# Patient Record
Sex: Female | Born: 1975
Health system: Southern US, Community
[De-identification: ages and names within clinical notes are randomized; demographics above are authoritative.]

## PROBLEM LIST (undated history)

## (undated) DIAGNOSIS — K219 Gastro-esophageal reflux disease without esophagitis: Secondary | ICD-10-CM

## (undated) DIAGNOSIS — N951 Menopausal and female climacteric states: Secondary | ICD-10-CM

## (undated) DIAGNOSIS — E785 Hyperlipidemia, unspecified: Secondary | ICD-10-CM

## (undated) DIAGNOSIS — K921 Melena: Secondary | ICD-10-CM

## (undated) DIAGNOSIS — N898 Other specified noninflammatory disorders of vagina: Secondary | ICD-10-CM

## (undated) DIAGNOSIS — R319 Hematuria, unspecified: Secondary | ICD-10-CM

## (undated) DIAGNOSIS — D509 Iron deficiency anemia, unspecified: Secondary | ICD-10-CM

## (undated) HISTORY — DX: Gastro-esophageal reflux disease without esophagitis: K21.9

## (undated) HISTORY — DX: Other specified noninflammatory disorders of vagina: N89.8

## (undated) HISTORY — DX: Hyperlipidemia, unspecified: E78.5

## (undated) HISTORY — PX: NO PAST SURGERIES: SHX2092

## (undated) HISTORY — DX: Menopausal and female climacteric states: N95.1

## (undated) HISTORY — PX: COLONOSCOPY WITH ESOPHAGOGASTRODUODENOSCOPY (EGD): SHX5779

## (undated) HISTORY — DX: Hematuria, unspecified: R31.9

---

## 2005-04-07 ENCOUNTER — Inpatient Hospital Stay: Payer: Self-pay | Admitting: Obstetrics and Gynecology

## 2010-06-04 ENCOUNTER — Ambulatory Visit: Payer: Self-pay | Admitting: Obstetrics and Gynecology

## 2010-09-17 ENCOUNTER — Inpatient Hospital Stay: Payer: Self-pay | Admitting: Obstetrics and Gynecology

## 2010-09-19 LAB — PATHOLOGY REPORT

## 2012-04-28 ENCOUNTER — Ambulatory Visit: Payer: Self-pay | Admitting: Obstetrics and Gynecology

## 2014-04-13 LAB — HM PAP SMEAR: HM Pap smear: NEGATIVE

## 2016-04-23 ENCOUNTER — Encounter: Payer: Self-pay | Admitting: Obstetrics and Gynecology

## 2016-05-01 ENCOUNTER — Ambulatory Visit (INDEPENDENT_AMBULATORY_CARE_PROVIDER_SITE_OTHER): Payer: BLUE CROSS/BLUE SHIELD | Admitting: Obstetrics and Gynecology

## 2016-05-01 ENCOUNTER — Encounter: Payer: Self-pay | Admitting: Obstetrics and Gynecology

## 2016-05-01 VITALS — BP 90/62 | HR 73 | Ht 63.0 in | Wt 134.8 lb

## 2016-05-01 DIAGNOSIS — Z0000 Encounter for general adult medical examination without abnormal findings: Secondary | ICD-10-CM

## 2016-05-01 DIAGNOSIS — Z01419 Encounter for gynecological examination (general) (routine) without abnormal findings: Secondary | ICD-10-CM

## 2016-05-01 DIAGNOSIS — Z1231 Encounter for screening mammogram for malignant neoplasm of breast: Secondary | ICD-10-CM | POA: Diagnosis not present

## 2016-05-01 NOTE — Patient Instructions (Signed)
1. Pap smear not. 2. Mammogram ordered 3. Continue with healthy eating and exercise 4. Return in 1 year for physical 5. No laboratory work necessary today

## 2016-05-01 NOTE — Progress Notes (Signed)
Patient ID: Paige Robertson, female   DOB: [birth year], 40 y.o.   MRN: [id number] ANNUAL PREVENTATIVE CARE GYN  ENCOUNTER NOTE  Subjective:       Paige Robertson is a 40 y.o. G33P2002 female here for a routine annual gynecologic exam.  Current complaints: 1.  none    Cycles regular. Bowel and bladder function are normal. No need interval health issues.   Gynecologic History Patient's last menstrual period was 04/18/2016 (approximate). Contraception: vasectomy Last Pap: 2015 n/n. Results were: normal. Last mammogram: n/a. Results were: n/a  Obstetric History OB History  Gravida Para Term Preterm AB SAB TAB Ectopic Multiple Living  2 2 2       2     # Outcome Date GA Lbr Len/2nd Weight Sex Delivery Anes PTL Lv  2 Term 2011   5 lb 2.1 oz (2.327 kg) F Vag-Spont   Y  1 Term 2006   6 lb 2.2 oz (2.785 kg) F Vag-Spont   Y      Past Medical History  Diagnosis Date  . Perimenopausal symptoms   . Hematuria   . Vaginal discharge   . Hyperlipemia     Past Surgical History  Procedure Laterality Date  . No past surgeries      No current outpatient prescriptions on file prior to visit.   No current facility-administered medications on file prior to visit.    Allergies  Allergen Reactions  . Sulfa Antibiotics     Social History   Social History  . Marital Status: Married    Spouse Name: N/A  . Number of Children: N/A  . Years of Education: N/A   Occupational History  . Not on file.   Social History Main Topics  . Smoking status: Never Smoker   . Smokeless tobacco: Not on file  . Alcohol Use: No  . Drug Use: No  . Sexual Activity: Yes     Comment: vasectomy   Other Topics Concern  . Not on file   Social History Narrative    Family History  Problem Relation Age of Onset  . Diabetes Father   . Breast cancer Maternal Grandmother   . Colon cancer Neg Hx   . Ovarian cancer Neg Hx   . Heart disease Neg Hx     The following portions of the patient's history were  reviewed and updated as appropriate: allergies, current medications, past family history, past medical history, past social history, past surgical history and problem list.  Review of Systems ROS Review of Systems - General ROS: negative for - chills, fatigue, fever, hot flashes, night sweats, weight gain or weight loss Psychological ROS: negative for - anxiety, decreased libido, depression, mood swings, physical abuse or sexual abuse Ophthalmic ROS: negative for - blurry vision, eye pain or loss of vision ENT ROS: negative for - headaches, hearing change, visual changes or vocal changes Allergy and Immunology ROS: negative for - hives, itchy/watery eyes or seasonal allergies Hematological and Lymphatic ROS: negative for - bleeding problems, bruising, swollen lymph nodes or weight loss Endocrine ROS: negative for - galactorrhea, hair pattern changes, hot flashes, malaise/lethargy, mood swings, palpitations, polydipsia/polyuria, skin changes, temperature intolerance or unexpected weight changes Breast ROS: negative for - new or changing breast lumps or nipple discharge Respiratory ROS: negative for - cough or shortness of breath Cardiovascular ROS: negative for - chest pain, irregular heartbeat, palpitations or shortness of breath Gastrointestinal ROS: no abdominal pain, change in bowel habits, or black or bloody stools Genito-Urinary ROS:  no dysuria, trouble voiding, or hematuria Musculoskeletal ROS: negative for - joint pain or joint stiffness Neurological ROS: negative for - bowel and bladder control changes Dermatological ROS: negative for rash and skin lesion changes   Objective:   BP 90/62 mmHg  Pulse 73  Ht 5\' 3"  (1.6 m)  Wt 134 lb 12.8 oz (61.145 kg)  BMI 23.88 kg/m2  LMP 04/18/2016 (Approximate) CONSTITUTIONAL: Well-developed, well-nourished female in no acute distress.  PSYCHIATRIC: Normal mood and affect. Normal behavior. Normal judgment and thought content. [place]: Alert  and oriented to person, place, and time. Normal muscle tone coordination. No cranial nerve deficit noted. HENT:  Normocephalic, atraumatic, External right and left ear normal. Oropharynx is clear and moist EYES: Conjunctivae and EOM are normal. No scleral icterus.  NECK: Normal range of motion, supple, no masses.  Normal thyroid.  SKIN: Skin is warm and dry. No rash noted. Not diaphoretic. No erythema. No pallor. CARDIOVASCULAR: Normal heart rate noted, regular rhythm, no murmur. RESPIRATORY: Clear to auscultation bilaterally. Effort and breath sounds normal, no problems with respiration noted. BREASTS: Symmetric in size. No masses, skin changes, nipple drainage, or lymphadenopathy. ABDOMEN: Soft, normal bowel sounds, no distention noted.  No tenderness, rebound or guarding.  BLADDER: Normal PELVIC:  External Genitalia: Normal  BUS: Normal  Vagina: Normal  Cervix: Normal; eversion present  Uterus: Normal; midplane, normal size and shape, mobile  Adnexa: Normal  RV: External Exam NormaI  MUSCULOSKELETAL: Normal range of motion. No tenderness.  No cyanosis, clubbing, or edema.  2+ distal pulses. LYMPHATIC: No Axillary, Supraclavicular, or Inguinal Adenopathy.    Assessment:   Annual gynecologic examination 40 y.o. Contraception: vasectomy Normal BMI  Plan:  Pap: Not done Mammogram: Ordered Stool Guaiac Testing:  Not Indicated Labs: thru - employer Routine preventative health maintenance measures emphasized: Exercise/Diet/Weight control, Tobacco Warnings and Alcohol/Substance use risks Return to [place], CMA Brayton Mars, MD  Note: This dictation was prepared with Dragon dictation along with smaller phrase technology. Any transcriptional errors that result from this process are unintentional.

## 2016-10-27 ENCOUNTER — Ambulatory Visit
Admission: RE | Admit: 2016-10-27 | Discharge: 2016-10-27 | Disposition: A | Discharge status: Home / Self Care | Payer: BLUE CROSS/BLUE SHIELD | Source: Ambulatory Visit | Attending: Obstetrics and Gynecology | Admitting: Obstetrics and Gynecology

## 2016-10-27 DIAGNOSIS — Z1231 Encounter for screening mammogram for malignant neoplasm of breast: Secondary | ICD-10-CM | POA: Diagnosis not present

## 2017-04-28 ENCOUNTER — Encounter: Payer: Self-pay | Admitting: Obstetrics and Gynecology

## 2017-04-28 ENCOUNTER — Other Ambulatory Visit: Payer: Self-pay

## 2017-04-28 ENCOUNTER — Telehealth: Payer: Self-pay | Admitting: Obstetrics and Gynecology

## 2017-04-28 DIAGNOSIS — Z01419 Encounter for gynecological examination (general) (routine) without abnormal findings: Secondary | ICD-10-CM

## 2017-04-28 NOTE — Telephone Encounter (Signed)
Patient lvm to send in a request for blood work to be done at next appointment. Patient disclosed that blood work has to be requested in order to have it done. Please advise.

## 2017-04-28 NOTE — Telephone Encounter (Signed)
Pt aware labs placed

## 2017-04-29 ENCOUNTER — Other Ambulatory Visit: Payer: Self-pay

## 2017-04-29 DIAGNOSIS — Z01419 Encounter for gynecological examination (general) (routine) without abnormal findings: Secondary | ICD-10-CM

## 2017-04-30 LAB — GLUCOSE, RANDOM: Glucose: 82 mg/dL (ref 65–99)

## 2017-04-30 LAB — LIPID PANEL
Chol/HDL Ratio: 2.4 ratio (ref 0.0–4.4)
Cholesterol, Total: 191 mg/dL (ref 100–199)
HDL: 79 mg/dL (ref >39)
LDL Calculated: 96 mg/dL (ref 0–99)
Triglycerides: 82 mg/dL (ref 0–149)
VLDL Cholesterol Cal: 16 mg/dL (ref 5–40)

## 2017-04-30 LAB — HEMOGLOBIN A1C
Est. average glucose Bld gHb Est-mCnc: 94 mg/dL
Hgb A1c MFr Bld: 4.9 % (ref 4.8–5.6)

## 2017-04-30 LAB — TSH: TSH: 3.22 u[IU]/mL (ref 0.450–4.500)

## 2017-05-04 NOTE — Progress Notes (Signed)
Patient ID: Paige Robertson, female   DOB: [birth year], 41 y.o.   MRN: [id number] ANNUAL PREVENTATIVE CARE GYN  ENCOUNTER NOTE  Subjective:       Paige Robertson is a 41 y.o. G2P2002 female here for a routine annual gynecologic exam.  Current complaints: 1. Stress Incontinence  Cycles regular. Bowel  function is normal. No new interval health issues.  GU history: Urinary frequency-5-6 per day Nocturia-none Stress urinary incontinence-present Urge symptoms-mild   Gynecologic History No LMP recorded. Contraception: vasectomy Last Pap: 2015 n/n. Results were: normal. Last mammogram: 10/2016 [facility]. Results were: wnl  Obstetric History OB History  Gravida Para Term Preterm AB Living  2 2 2     2   SAB TAB Ectopic Multiple Live Births          2    # Outcome Date GA Lbr Len/2nd Weight Sex Delivery Anes PTL Lv  2 Term 2011   5 lb 2.1 oz (2.327 kg) F Vag-Spont   LIV  1 Term 2006   6 lb 2.2 oz (2.785 kg) F Vag-Spont   LIV      Past Medical History:  Diagnosis Date  . Hematuria   . Hyperlipemia   . Perimenopausal symptoms   . Vaginal discharge     Past Surgical History:  Procedure Laterality Date  . NO PAST SURGERIES      No current outpatient prescriptions on file prior to visit.   No current facility-administered medications on file prior to visit.     Allergies  Allergen Reactions  . Sulfa Antibiotics     Social History   Social History  . Marital status: Married    Spouse name: N/A  . Number of children: N/A  . Years of education: N/A   Occupational History  . Not on file.   Social History Main Topics  . Smoking status: Never Smoker  . Smokeless tobacco: Not on file  . Alcohol use No  . Drug use: No  . Sexual activity: Yes     Comment: vasectomy   Other Topics Concern  . Not on file   Social History Narrative  . No narrative on file    Family History  Problem Relation Age of Onset  . Diabetes Father   . Breast cancer Maternal Grandmother    . Breast cancer Paternal Aunt   . Colon cancer Neg Hx   . Ovarian cancer Neg Hx   . Heart disease Neg Hx     The following portions of the patient's history were reviewed and updated as appropriate: allergies, current medications, past family history, past medical history, past social history, past surgical history and problem list.  Review of Systems Review of Systems  Constitutional: Negative.   HENT: Negative.   Eyes: Negative.   Respiratory: Negative.   Cardiovascular: Negative.   Gastrointestinal: Negative.   Genitourinary:       Urge symptoms present Stress incontinence present No nocturia No recurrent UTIs  Musculoskeletal: Negative.   Skin: Negative.   Neurological: Negative.   Endo/Heme/Allergies: Negative.   Psychiatric/Behavioral: Negative.      Objective:   BP (!) 93/57   Pulse 89   Ht 5\' 3"  (1.6 m)   Wt 129 lb 12.8 oz (58.9 kg)   LMP 04/03/2017 (Exact Date)   BMI 22.99 kg/m  CONSTITUTIONAL: Well-developed, well-nourished female in no acute distress.  PSYCHIATRIC: Normal mood and affect. Normal behavior. Normal judgment and thought content. [place]: Alert and oriented to person, place, and time.  Normal muscle tone coordination. No cranial nerve deficit noted. HENT:  Normocephalic, atraumatic, External right and left ear normal. Oropharynx is clear and moist EYES: Conjunctivae and EOM are normal. No scleral icterus.  NECK: Normal range of motion, supple, no masses.  Normal thyroid.  SKIN: Skin is warm and dry. No rash noted. Not diaphoretic. No erythema. No pallor. CARDIOVASCULAR: Normal heart rate noted, regular rhythm, no murmur. RESPIRATORY: Clear to auscultation bilaterally. Effort and breath sounds normal, no problems with respiration noted. BREASTS: Symmetric in size. No masses, skin changes, nipple drainage, or lymphadenopathy. ABDOMEN: Soft, normal bowel sounds, no distention noted.  No tenderness, rebound or guarding.  BLADDER:  Normal PELVIC:  External Genitalia: Normal  BUS: Normal  Vagina: Normal  Cervix: Normal; eversion present  Uterus: Normal; midplane, normal size and shape, mobile  Adnexa: Normal  RV: External Exam NormaI  MUSCULOSKELETAL: Normal range of motion. No tenderness.  No cyanosis, clubbing, or edema.  2+ distal pulses. LYMPHATIC: No Axillary, Supraclavicular, or Inguinal Adenopathy.  Review of lipid panel: Total cholesterol-194 LDL-96 HDL-79 HDL/cholesterol ratio-2.4  Assessment:   Annual gynecologic examination 41 y.o. Contraception: vasectomy Normal BMI  SUI Mild urge symptoms Family history of hyperlipidemia; normal lipid 1 panel  Plan:  Pap: pap/hpv Mammogram: Ordered Stool Guaiac Testing:  Not Indicated Labs: thru - employer Routine preventative health maintenance measures emphasized: Exercise/Diet/Weight control, Tobacco Warnings and Alcohol/Substance use risks  Kegel exercises encouraged Physical therapy consult is recommended but declined at this time; patient may call back if Kegel exercises on are not effective Return to [place], CMA  Brayton Mars, MD   Note: This dictation was prepared with Dragon dictation along with smaller phrase technology. Any transcriptional errors that result from this process are unintentional.

## 2017-05-06 ENCOUNTER — Encounter: Payer: Self-pay | Admitting: Obstetrics and Gynecology

## 2017-05-06 ENCOUNTER — Ambulatory Visit (INDEPENDENT_AMBULATORY_CARE_PROVIDER_SITE_OTHER): Payer: BLUE CROSS/BLUE SHIELD | Admitting: Obstetrics and Gynecology

## 2017-05-06 VITALS — BP 93/57 | HR 89 | Ht 63.0 in | Wt 129.8 lb

## 2017-05-06 DIAGNOSIS — Z01419 Encounter for gynecological examination (general) (routine) without abnormal findings: Secondary | ICD-10-CM

## 2017-05-06 DIAGNOSIS — Z1231 Encounter for screening mammogram for malignant neoplasm of breast: Secondary | ICD-10-CM

## 2017-05-06 DIAGNOSIS — N393 Stress incontinence (female) (male): Secondary | ICD-10-CM

## 2017-05-06 NOTE — Patient Instructions (Addendum)
1. Pap smear is completed 2. Mammogram is ordered 3. Screening labs have been done already and reviewed 4. Continue with healthy eating and exercise 5. Return in 1 year for annual exam 6. Kegel exercises are encouraged 7. Physical therapy consult for stress incontinence was offered but declined at this time   Kegel Exercises Kegel exercises help strengthen the muscles that support the rectum, vagina, small intestine, bladder, and uterus. Doing Kegel exercises can help:  Improve bladder and bowel control.  Improve sexual response.  Reduce problems and discomfort during pregnancy.  Kegel exercises involve squeezing your pelvic floor muscles, which are the same muscles you squeeze when you try to stop the flow of urine. The exercises can be done while sitting, standing, or lying down, but it is best to vary your position. Phase 1 exercises 1. Squeeze your pelvic floor muscles tight. You should feel a tight lift in your rectal area. If you are a female, you should also feel a tightness in your vaginal area. Keep your stomach, buttocks, and legs relaxed. 2. Hold the muscles tight for up to 10 seconds. 3. Relax your muscles. Repeat this exercise 50 times a day or as many times as told by your health care provider. Continue to do this exercise for at least 4-6 weeks or for as long as told by your health care provider. This information is not intended to replace advice given to you by your health care provider. Make sure you discuss any questions you have with your health care provider. Document Released: 11/03/2012 Document Revised: 07/12/2016 Document Reviewed: 10/07/2015 Elsevier Interactive Patient Education  2018 [place] Maintenance, Female Adopting a healthy lifestyle and getting preventive care can go a long way to promote health and wellness. Talk with your health care provider about what schedule of regular examinations is right for you. This is a good chance for you to  check in with your provider about disease prevention and staying healthy. In between checkups, there are plenty of things you can do on your own. Experts have done a lot of research about which lifestyle changes and preventive measures are most likely to keep you healthy. Ask your health care provider for more information. Weight and diet Eat a healthy diet  Be sure to include plenty of vegetables, fruits, low-fat dairy products, and lean protein.  Do not eat a lot of foods high in solid fats, added sugars, or salt.  Get regular exercise. This is one of the most important things you can do for your health. ? Most adults should exercise for at least 150 minutes each week. The exercise should increase your heart rate and make you sweat (moderate-intensity exercise). ? Most adults should also do strengthening exercises at least twice a week. This is in addition to the moderate-intensity exercise.  Maintain a healthy weight  Body mass index (BMI) is a measurement that can be used to identify possible weight problems. It estimates body fat based on height and weight. Your health care provider can help determine your BMI and help you achieve or maintain a healthy weight.  For females 72 years of age and older: ? A BMI below 18.5 is considered underweight. ? A BMI of 18.5 to 24.9 is normal. ? A BMI of 25 to 29.9 is considered overweight. ? A BMI of 30 and above is considered obese.  Watch levels of cholesterol and blood lipids  You should start having your blood tested for lipids and cholesterol at 41  years of age, then have this test every 5 years.  You may need to have your cholesterol levels checked more often if: ? Your lipid or cholesterol levels are high. ? You are older than 41 years of age. ? You are at high risk for heart disease.  Cancer screening Lung Cancer  Lung cancer screening is recommended for adults 32-4 years old who are at high risk for lung cancer because of a  history of smoking.  A yearly low-dose CT scan of the lungs is recommended for people who: ? Currently smoke. ? Have quit within the past 15 years. ? Have at least a 30-pack-year history of smoking. A pack year is smoking an average of one pack of cigarettes a day for 1 year.  Yearly screening should continue until it has been 15 years since you quit.  Yearly screening should stop if you develop a health problem that would prevent you from having lung cancer treatment.  Breast Cancer  Practice breast self-awareness. This means understanding how your breasts normally appear and feel.  It also means doing regular breast self-exams. Let your health care provider know about any changes, no matter how small.  If you are in your 20s or 30s, you should have a clinical breast exam (CBE) by a health care provider every 1-3 years as part of a regular health exam.  If you are 59 or older, have a CBE every year. Also consider having a breast X-ray (mammogram) every year.  If you have a family history of breast cancer, talk to your health care provider about genetic screening.  If you are at high risk for breast cancer, talk to your health care provider about having an MRI and a mammogram every year.  Breast cancer gene (BRCA) assessment is recommended for women who have family members with BRCA-related cancers. BRCA-related cancers include: ? Breast. ? Ovarian. ? Tubal. ? Peritoneal cancers.  Results of the assessment will determine the need for genetic counseling and BRCA1 and BRCA2 testing.  Cervical Cancer Your health care provider may recommend that you be screened regularly for cancer of the pelvic organs (ovaries, uterus, and vagina). This screening involves a pelvic examination, including checking for microscopic changes to the surface of your cervix (Pap test). You may be encouraged to have this screening done every 3 years, beginning at age 9.  For women ages 74-65, health care  providers may recommend pelvic exams and Pap testing every 3 years, or they may recommend the Pap and pelvic exam, combined with testing for human papilloma virus (HPV), every 5 years. Some types of HPV increase your risk of cervical cancer. Testing for HPV may also be done on women of any age with unclear Pap test results.  Other health care providers may not recommend any screening for nonpregnant women who are considered low risk for pelvic cancer and who do not have symptoms. Ask your health care provider if a screening pelvic exam is right for you.  If you have had past treatment for cervical cancer or a condition that could lead to cancer, you need Pap tests and screening for cancer for at least 20 years after your treatment. If Pap tests have been discontinued, your risk factors (such as having a new sexual partner) need to be reassessed to determine if screening should resume. Some women have medical problems that increase the chance of getting cervical cancer. In these cases, your health care provider may recommend more frequent screening and Pap tests.  Colorectal Cancer  This type of cancer can be detected and often prevented.  Routine colorectal cancer screening usually begins at 41 years of age and continues through 41 years of age.  Your health care provider may recommend screening at an earlier age if you have risk factors for colon cancer.  Your health care provider may also recommend using home test kits to check for hidden blood in the stool.  A small camera at the end of a tube can be used to examine your colon directly (sigmoidoscopy or colonoscopy). This is done to check for the earliest forms of colorectal cancer.  Routine screening usually begins at age 21.  Direct examination of the colon should be repeated every 5-10 years through 41 years of age. However, you may need to be screened more often if early forms of precancerous polyps or small growths are found.  Skin  Cancer  Check your skin from head to toe regularly.  Tell your health care provider about any new moles or changes in moles, especially if there is a change in a mole's shape or color.  Also tell your health care provider if you have a mole that is larger than the size of a pencil eraser.  Always use sunscreen. Apply sunscreen liberally and repeatedly throughout the day.  Protect yourself by wearing long sleeves, pants, a wide-brimmed hat, and sunglasses whenever you are outside.  Heart disease, diabetes, and high blood pressure  High blood pressure causes heart disease and increases the risk of stroke. High blood pressure is more likely to develop in: ? People who have blood pressure in the high end of the normal range (130-139/85-89 mm Hg). ? People who are overweight or obese. ? People who are African American.  If you are 9-57 years of age, have your blood pressure checked every 3-5 years. If you are 45 years of age or older, have your blood pressure checked every year. You should have your blood pressure measured twice-once when you are at a hospital or clinic, and once when you are not at a hospital or clinic. Record the average of the two measurements. To check your blood pressure when you are not at a hospital or clinic, you can use: ? An automated blood pressure machine at a pharmacy. ? A home blood pressure monitor.  If you are between 75 years and 49 years old, ask your health care provider if you should take aspirin to prevent strokes.  Have regular diabetes screenings. This involves taking a blood sample to check your fasting blood sugar level. ? If you are at a normal weight and have a low risk for diabetes, have this test once every three years after 41 years of age. ? If you are overweight and have a high risk for diabetes, consider being tested at a younger age or more often. Preventing infection Hepatitis B  If you have a higher risk for hepatitis B, you should be  screened for this virus. You are considered at high risk for hepatitis B if: ? You were born in a country where hepatitis B is common. Ask your health care provider which countries are considered high risk. ? Your parents were born in a high-risk country, and you have not been immunized against hepatitis B (hepatitis B vaccine). ? You have HIV or AIDS. ? You use needles to inject street drugs. ? You live with someone who has hepatitis B. ? You have had sex with someone who has hepatitis B. ?  You get hemodialysis treatment. ? You take certain medicines for conditions, including cancer, organ transplantation, and autoimmune conditions.  Hepatitis C  Blood testing is recommended for: ? Everyone born from [birth year] through 1965. ? Anyone with known risk factors for hepatitis C.  Sexually transmitted infections (STIs)  You should be screened for sexually transmitted infections (STIs) including gonorrhea and chlamydia if: ? You are sexually active and are younger than 41 years of age. ? You are older than 41 years of age and your health care provider tells you that you are at risk for this type of infection. ? Your sexual activity has changed since you were last screened and you are at an increased risk for chlamydia or gonorrhea. Ask your health care provider if you are at risk.  If you do not have HIV, but are at risk, it may be recommended that you take a prescription medicine daily to prevent HIV infection. This is called pre-exposure prophylaxis (PrEP). You are considered at risk if: ? You are sexually active and do not regularly use condoms or know the HIV status of your partner(s). ? You take drugs by injection. ? You are sexually active with a partner who has HIV.  Talk with your health care provider about whether you are at high risk of being infected with HIV. If you choose to begin PrEP, you should first be tested for HIV. You should then be tested every 3 months for as long as you are  taking PrEP. Pregnancy  If you are premenopausal and you may become pregnant, ask your health care provider about preconception counseling.  If you may become pregnant, take 400 to 800 micrograms (mcg) of folic acid every day.  If you want to prevent pregnancy, talk to your health care provider about birth control (contraception). Osteoporosis and menopause  Osteoporosis is a disease in which the bones lose minerals and strength with aging. This can result in serious bone fractures. Your risk for osteoporosis can be identified using a bone density scan.  If you are 23 years of age or older, or if you are at risk for osteoporosis and fractures, ask your health care provider if you should be screened.  Ask your health care provider whether you should take a calcium or vitamin D supplement to lower your risk for osteoporosis.  Menopause may have certain physical symptoms and risks.  Hormone replacement therapy may reduce some of these symptoms and risks. Talk to your health care provider about whether hormone replacement therapy is right for you. Follow these instructions at home:  Schedule regular health, dental, and eye exams.  Stay current with your immunizations.  Do not use any tobacco products including cigarettes, chewing tobacco, or electronic cigarettes.  If you are pregnant, do not drink alcohol.  If you are breastfeeding, limit how much and how often you drink alcohol.  Limit alcohol intake to no more than 1 drink per day for nonpregnant women. One drink equals 12 ounces of beer, 5 ounces of wine, or 1 ounces of hard liquor.  Do not use street drugs.  Do not share needles.  Ask your health care provider for help if you need support or information about quitting drugs.  Tell your health care provider if you often feel depressed.  Tell your health care provider if you have ever been abused or do not feel safe at home. This information is not intended to replace  advice given to you by your health care provider. Make sure  you discuss any questions you have with your health care provider. Document Released: 06/02/2011 Document Revised: 04/24/2016 Document Reviewed: 08/21/2015 Elsevier Interactive Patient Education  [employer].

## 2017-05-08 LAB — PAP IG AND HPV HIGH-RISK
HPV, high-risk: NEGATIVE
PAP Smear Comment: 0

## 2017-07-16 ENCOUNTER — Telehealth: Payer: Self-pay | Admitting: Obstetrics and Gynecology

## 2017-07-16 MED ORDER — FLUCONAZOLE 150 MG PO TABS: 150.0000 mg | ORAL_TABLET | Freq: Every day | ORAL | 0 refills | Status: DC

## 2017-07-16 NOTE — Telephone Encounter (Signed)
Patient called stating that she has/may have a yeast infection. The patient is wanting Dr.De to prescribe medication to help with the "issue" she is having. The patient did not disclose any other information. Please advise.

## 2017-07-16 NOTE — Telephone Encounter (Signed)
Patient called back and wants to speak with nurse - confirmed message to CM - patient will receive call back as soon as CM can

## 2017-07-16 NOTE — Telephone Encounter (Signed)
Pt states she is having vaginal itching and burning. NO d/c. NO change in body wash, detergent, or fabric softener. Diflucan erx. Pt aware if no better in 7 days she will need to be seen.

## 2017-09-03 ENCOUNTER — Ambulatory Visit: Payer: Self-pay | Admitting: Adult Health

## 2017-09-03 ENCOUNTER — Encounter: Payer: Self-pay | Admitting: Adult Health

## 2017-09-03 VITALS — BP 96/62 | HR 78 | Temp 97.8°F | Resp 16 | Wt 130.0 lb

## 2017-09-03 DIAGNOSIS — H6092 Unspecified otitis externa, left ear: Secondary | ICD-10-CM

## 2017-09-03 DIAGNOSIS — H6690 Otitis media, unspecified, unspecified ear: Secondary | ICD-10-CM

## 2017-09-03 MED ORDER — AMOXICILLIN-POT CLAVULANATE 875-125 MG PO TABS: 1.0000 | ORAL_TABLET | Freq: Two times a day (BID) | ORAL | 0 refills | Status: DC

## 2017-09-03 MED ORDER — NEOMYCIN-POLYMYXIN-HC 3.5-10000-1 OT SOLN: 3.0000 [drp] | Freq: Four times a day (QID) | OTIC | 0 refills | Status: AC

## 2017-09-03 NOTE — Progress Notes (Addendum)
Subjective:     Patient ID: Paige Robertson, female   DOB: 03-19-1976, 41 y.o.   MRN: [id number]  HPI  Patient is a 41 year old female in no acute distress who presents with left ear pain 5/10 currently. She reports ear pain started this past Sunday.  She reports she has had cold like symptoms for the past week, she has post  nasal drip. Clear nasal discharge. She denies any recent swimming but does report she gets her ears wet in shower,  She denies any fevers, nausea, vomiting, or diarrhea.  No ill exposures.  Blood pressure 96/62, pulse 78, temperature 97.8 F (36.6 C), resp. rate 16, weight 130 lb (59 kg), SpO2 98 %. Patient's last menstrual period was 08/18/2017 (exact date).   Allergies  Allergen Reactions  . Sulfa Antibiotics   She reports taking the below medications.  Zyrtec daily  Flonase daily.   Patient Active Problem List   Diagnosis Date Noted  . SUI (stress urinary incontinence, female) 05/06/2017    Current Outpatient Prescriptions:     Review of Systems  Constitutional: Negative.   HENT: Positive for ear pain and postnasal drip. Negative for congestion, dental problem, drooling, ear discharge, facial swelling, hearing loss, mouth sores, nosebleeds, rhinorrhea, sinus pain, sinus pressure, sneezing, sore throat, tinnitus, trouble swallowing and voice change.   Eyes: Negative.   Respiratory: Negative.   Cardiovascular: Negative.   Gastrointestinal: Negative.   Genitourinary: Negative.   Musculoskeletal: Negative.   Skin: Negative.   Neurological: Negative.   Hematological: Negative.   Psychiatric/Behavioral: Negative.        Objective:   Physical Exam  Constitutional: She is oriented to person, place, and time. She appears well-developed and well-nourished. No distress.  HENT:  Head: Normocephalic and atraumatic.  Right Ear: Hearing, tympanic membrane, external ear and ear canal normal.  Left Ear: Hearing normal. No lacerations. There is swelling (mild  in canal able to clearly visualize tmpanic membrane ) and tenderness (mild with tragal pull ). No drainage. No foreign bodies. No mastoid tenderness. Tympanic membrane is erythematous. Tympanic membrane is not injected, not perforated, not retracted and not bulging. A middle ear effusion is present. No hemotympanum. No decreased hearing is noted.  Eyes: Conjunctivae and EOM are normal. Right eye exhibits no discharge. Left eye exhibits no discharge. No scleral icterus.  Neck: Normal range of motion. Neck supple. No JVD present. No tracheal deviation present. No thyromegaly present.  Cardiovascular: Normal rate, regular rhythm, normal heart sounds and intact distal pulses.  Exam reveals no gallop and no friction rub.   No murmur heard. Pulmonary/Chest: Effort normal and breath sounds normal. No stridor. No respiratory distress. She has no wheezes. She has no rales. She exhibits no tenderness.  Abdominal: Soft. Bowel sounds are normal.  Musculoskeletal: Normal range of motion. She exhibits no edema or deformity.  Lymphadenopathy:       Head (right side): No submental, no submandibular, no tonsillar, no preauricular, no posterior auricular and no occipital adenopathy present.       Head (left side): Posterior auricular (mild / single tiny node present ) adenopathy present. No submental, no submandibular, no tonsillar, no preauricular and no occipital adenopathy present.    She has no cervical adenopathy.  Neurological: She is alert and oriented to person, place, and time.  Patient moves on and off of exam table and in room without difficulty. Gait is normal in hall and in room. Patient is oriented to person place time and situation.  Patient answers questions appropriately and engages in conversation.   Skin: Skin is warm and dry. No rash noted. She is not diaphoretic. No erythema. No pallor.  Psychiatric: She has a normal mood and affect. Her speech is normal and behavior is normal. Judgment and thought  content normal. Cognition and memory are normal.     Assessment:     Otitis externa of left ear, unspecified chronicity, unspecified type  Acute otitis media, unspecified otitis media type    Plan:    Ordered only as below- she had Diflucan a month ago but reports she has completed dose.  Meds ordered this encounter  Medications  . DISCONTD: fluconazole (DIFLUCAN) 150 MG tablet    Refill:  0  . amoxicillin-clavulanate (AUGMENTIN) 875-125 MG tablet    Sig: Take 1 tablet by mouth 2 (two) times daily.    Dispense:  20 tablet    Refill:  0  . neomycin-polymyxin-hydrocortisone (CORTISPORIN) OTIC solution    Sig: Place 3 drops into the left ear 4 (four) times daily.    Dispense:  10 mL    Refill:  0   May stop Zyrtec and switch to Allegra daily per package instructions to see if better response since you have been on Zyrtec for years.    Return to clinic at any time  if any new symptoms change, worsen or do not improve. Symptoms should improve  within 72 hours and if not improving you should call for an appointment at the clinic or be seen in urgent care/ED if clinic is closed. Your symptoms should not get worse from this point forward and if they do seek immediate medical attention. AVS handout also given to patient.   Patient verbalized understanding of instructions and denies any further questions at this time.

## 2017-09-03 NOTE — Patient Instructions (Signed)
Fexofenadine capsules or tablets What is this medicine? FEXOFENADINE (fex oh FEN a deen) is an antihistamine. This medicine is used to treat or prevent symptoms of allergies. It is also used to help reduce itchy skin rash and hives. This medicine may be used for other purposes; ask your health care provider or pharmacist if you have questions. COMMON BRAND NAME(S): Allegra, Allegra Allergy 12 Hour, Allegra Allergy 24 Hour, Allegra Children's Allergy What should I tell my health care provider before I take this medicine? They need to know if you have any of these conditions: -kidney disease -an unusual or allergic reaction to fexofenadine, terfenadine, other medicines, foods, dyes, or preservatives -pregnant or trying to get pregnant -breast-feeding How should I use this medicine? Take this medicine by mouth with a full glass of water. Follow the directions on the prescription label. You may take this medicine with food or on an empty stomach. Take your medicine at regular intervals. Do not take it more often than directed. You may need to take this medicine for several days before your symptoms improve. Talk to your pediatrician regarding the use of this medicine in children. While this drug may be prescribed for children as young as 70 years old for selected conditions, precautions do apply. Overdosage: If you think you have taken too much of this medicine contact a poison control center or emergency room at once. NOTE: This medicine is only for you. Do not share this medicine with others. What if I miss a dose? If you miss a dose, take it as soon as you can. If it is almost time for your next dose, take only that dose. Do not take double or extra doses. What may interact with this medicine? -antacids -erythromycin -grapefruit, apple, or orange juice -ketoconazole -magnesium-containing products This list may not describe all possible interactions. Give your health care provider a list of all  the medicines, herbs, non-prescription drugs, or dietary supplements you use. Also tell them if you smoke, drink alcohol, or use illegal drugs. Some items may interact with your medicine. What should I watch for while using this medicine? Visit your doctor or health care professional for regular checks on your health. Tell your doctor or healthcare professional if your symptoms do not start to get better or if they get worse. What side effects may I notice from receiving this medicine? Side effects that you should report to your doctor or health care professional as soon as possible: -allergic reactions like skin rash, itching or hives, swelling of the face, lips, or tongue -breathing problems -chest pain -fast heartbeat -infection or fever Side effects that usually do not require medical attention (report to your doctor or health care professional if they continue or are bothersome): -cough -drowsiness -dry or irritated nose, mouth, or throat -headache -menstrual changes -pain -stomach upset, nausea This list may not describe all possible side effects. Call your doctor for medical advice about side effects. You may report side effects to FDA at 1-800-FDA-1088. Where should I keep my medicine? Keep out of the reach of children. Store at room temperature between 20 and 25 degrees C (68 and 77degrees F). Protect from moisture. Throw away any unused medicine after the expiration date. NOTE: This sheet is a summary. It may not cover all possible information. If you have questions about this medicine, talk to your doctor, pharmacist, or health care provider.  2018 Elsevier/Gold Standard (2006-08-19 12:30:00) Otitis Externa Otitis externa is an infection of the outer ear canal. The  outer ear canal is the area between the outside of the ear and the eardrum. Otitis externa is sometimes called "swimmer's ear." What are the causes? This condition may be caused by:  Swimming in dirty  water.  Moisture in the ear.  An injury to the inside of the ear.  An object stuck in the ear.  A cut or scrape on the outside of the ear.  What increases the risk? This condition is more likely to develop in swimmers. What are the signs or symptoms? The first symptom of this condition is often itching in the ear. Later signs and symptoms include:  Swelling of the ear.  Redness in the ear.  Ear pain. The pain may get worse when you pull on your ear.  Pus coming from the ear.  How is this diagnosed? This condition may be diagnosed by examining the ear and testing fluid from the ear for bacteria and funguses. How is this treated? This condition may be treated with:  Antibiotic ear drops. These are often given for 10-14 days.  Medicine to reduce itching and swelling.  Follow these instructions at home:  If you were prescribed antibiotic ear drops, apply them as told by your health care provider. Do not stop using the antibiotic even if your condition improves.  Take over-the-counter and prescription medicines only as told by your health care provider.  Keep all follow-up visits as told by your health care provider. This is important. How is this prevented?  Keep your ear dry. Use the corner of a towel to dry your ear after you swim or bathe.  Avoid scratching or putting things in your ear. Doing these things can damage the ear canal or remove the protective wax that lines it, which makes it easier for bacteria and funguses to grow.  Avoid swimming in lakes, polluted water, or pools that may not have the right amount of chlorine.  Consider making ear drops and putting 3 or 4 drops in each ear after you swim. Ask your health care provider about how you can make ear drops. Contact a health care provider if:  You have a fever.  After 3 days your ear is still red, swollen, painful, or draining pus.  Your redness, swelling, or pain gets worse.  You have a severe  headache.  You have redness, swelling, pain, or tenderness in the area behind your ear. This information is not intended to replace advice given to you by your health care provider. Make sure you discuss any questions you have with your health care provider. Document Released: 11/17/2005 Document Revised: 12/25/2015 Document Reviewed: 08/27/2015 Elsevier Interactive Patient Education  2018 [employer]. Otitis Media, Adult Otitis media is redness, soreness, and puffiness (swelling) in the space just behind your eardrum (middle ear). It may be caused by allergies or infection. It often happens along with a cold. Follow these instructions at home:  Take your medicine as told. Finish it even if you start to feel better.  Only take over-the-counter or prescription medicines for pain, discomfort, or fever as told by your doctor.  Follow up with your doctor as told. Contact a doctor if:  You have otitis media only in one ear, or bleeding from your nose, or both.  You notice a lump on your neck.  You are not getting better in 3-5 days.  You feel worse instead of better. Get help right away if:  You have pain that is not helped with medicine.  You have  puffiness, redness, or pain around your ear.  You get a stiff neck.  You cannot move part of your face (paralysis).  You notice that the bone behind your ear hurts when you touch it. This information is not intended to replace advice given to you by your health care provider. Make sure you discuss any questions you have with your health care provider. Document Released: 05/05/2008 Document Revised: 04/24/2016 Document Reviewed: 06/14/2013 Elsevier Interactive Patient Education  2017 [employer].

## 2017-10-01 ENCOUNTER — Ambulatory Visit: Payer: Self-pay | Admitting: Adult Health

## 2017-10-01 VITALS — BP 108/64 | HR 96 | Temp 99.4°F | Resp 16 | Wt 130.0 lb

## 2017-10-01 DIAGNOSIS — J018 Other acute sinusitis: Secondary | ICD-10-CM

## 2017-10-01 LAB — POCT URINE PREGNANCY: Preg Test, Ur: NEGATIVE

## 2017-10-01 MED ORDER — DOXYCYCLINE HYCLATE 100 MG PO TABS: 100.0000 mg | ORAL_TABLET | Freq: Two times a day (BID) | ORAL | 0 refills | Status: DC

## 2017-10-01 NOTE — Patient Instructions (Signed)
Guaifenesin oral ER tablets What is this medicine? GUAIFENESIN (gwye FEN e sin) is an expectorant. It helps to thin mucous and make coughs more productive. This medicine is used to treat coughs caused by colds or the flu. It is not intended to treat chronic cough caused by smoking, asthma, emphysema, or heart failure. This medicine may be used for other purposes; ask your health care provider or pharmacist if you have questions. COMMON BRAND NAME(S): Humibid, Mucinex What should I tell my health care provider before I take this medicine? They need to know if you have any of these conditions: -fever -kidney disease -an unusual or allergic reaction to guaifenesin, other medicines, foods, dyes, or preservatives -pregnant or trying to get pregnant -breast-feeding How should I use this medicine? Take this medicine by mouth with a full glass of water. Follow the directions on the prescription label. Do not break, chew or crush this medicine. You may take with food or on an empty stomach. Take your medicine at regular intervals. Do not take your medicine more often than directed. Talk to your pediatrician regarding the use of this medicine in children. While this drug may be prescribed for children as young as 55 years old for selected conditions, precautions do apply. Overdosage: If you think you have taken too much of this medicine contact a poison control center or emergency room at once. NOTE: This medicine is only for you. Do not share this medicine with others. What if I miss a dose? If you miss a dose, take it as soon as you can. If it is almost time for your next dose, take only that dose. Do not take double or extra doses. What may interact with this medicine? Interactions are not expected. This list may not describe all possible interactions. Give your health care provider a list of all the medicines, herbs, non-prescription drugs, or dietary supplements you use. Also tell them if you smoke,  drink alcohol, or use illegal drugs. Some items may interact with your medicine. What should I watch for while using this medicine? Do not treat a cough for more than 1 week without consulting your doctor or health care professional. If you also have a high fever, skin rash, continuing headache, or sore throat, see your doctor. For best results, drink 6 to 8 glasses water daily while you are taking this medicine. What side effects may I notice from receiving this medicine? Side effects that you should report to your doctor or health care professional as soon as possible: -allergic reactions like skin rash, itching or hives, swelling of the face, lips, or tongue Side effects that usually do not require medical attention (report to your doctor or health care professional if they continue or are bothersome): -dizziness -headache -stomach upset This list may not describe all possible side effects. Call your doctor for medical advice about side effects. You may report side effects to FDA at 1-800-FDA-1088. Where should I keep my medicine? Keep out of the reach of children. Store at room temperature between 20 and 25 degrees C (68 and 77 degrees F). Keep container tightly closed. Throw away any unused medicine after the expiration date. NOTE: This sheet is a summary. It may not cover all possible information. If you have questions about this medicine, talk to your doctor, pharmacist, or health care provider.  2018 Elsevier/Gold Standard (2008-03-29 12:14:14) Pseudoephedrine extended-release tablets What is this medicine? PSEUDOEPHEDRINE (soo doe e FED rin) is a decongestant. It is used to treat congestion  of the nose or sinuses. This medicine may be used for other purposes; ask your health care provider or pharmacist if you have questions. COMMON BRAND NAME(S): Dimetapp Decongestant, Drixoral, Sudafed 12 Hour, Sudafed 24 Hour, Sudogest 12 Hour What should I tell my health care provider before I take  this medicine? They need to know if you have any of the following conditions: -bowel problems -diabetes -glaucoma -heart disease -high blood pressure -kidney disease -prostate trouble -taken an MAOI like Carbex, Eldepryl, Marplan, Nardil, or Parnate in last 14 days -thyroid disease -trouble passing urine -an unusual or allergic reaction to pseudoephedrine, other medicines, foods, dyes, or preservatives -pregnant or trying to get pregnant -breast-feeding How should I use this medicine? Take this medicine by mouth with a glass of water. Follow the directions on the package or prescription label. Do not cut, crush or chew this medicine. Take your medicine at regular intervals. Do not take your medicine more often than directed. Talk to your pediatrician regarding the use of this medicine in children. While this drug may be prescribed for children as young as 47 years of age for selected conditions, precautions do apply. Patients over 9 years old may have a stronger reaction and need a smaller dose. Overdosage: If you think you have taken too much of this medicine contact a poison control center or emergency room at once. NOTE: This medicine is only for you. Do not share this medicine with others. What if I miss a dose? If you miss a dose, take it as soon as you can. If it is almost time for your next dose, take only that dose. Do not take double or extra doses. What may interact with this medicine? Do not take this medicine with any of the following medications: -bromocriptine -ergot alkaloids like dihydroergotamine, ergonovine, ergotamine, methylergonovine -MAOIs like Carbex, Eldepryl, Marplan, Nardil, and Parnate -stimulant medicines for attention disorders, weight loss, or to stay awake This medicine may also interact with the following medications: -alcohol -atropine -bretylium -caffeine -digoxin -linezolid -mecamylamine -medicines for blood pressure -medicines for depression,  anxiety, or psychotic disturbances like fluoxetine, sertraline -medicines for enlarged prostate -medicines for sleep -other medicines for cold, cough, or allergy -procarbazine -reserpine -some heart medicines like metoprolol -St. John's Wort This list may not describe all possible interactions. Give your health care provider a list of all the medicines, herbs, non-prescription drugs, or dietary supplements you use. Also tell them if you smoke, drink alcohol, or use illegal drugs. Some items may interact with your medicine. What should I watch for while using this medicine? Tell your doctor or healthcare professional if your symptoms do not start to get better or if they get worse. See your doctor if you are not better in 7 days or if you have a fever. If this medicine makes it hard for you to sleep, try taking the dose earlier in the day. If you still have trouble sleeping, stop taking this medicine and see your doctor. The tablet shell for some brands of this medicine does not dissolve and may appear whole in the stool. This is not a cause for concern. What side effects may I notice from receiving this medicine? Side effects that you should report to your doctor or health care professional as soon as possible: -allergic reactions like skin rash, itching or hives, swelling of the face, lips, or tongue -bloody diarrhea with stomach pain -breathing problems -chest pain -confused, agitated, nervous -fast, irregular heartbeat -feeling faint or lightheaded, falls -hallucinations -high  blood pressure -pain, tingling, numbness in the hands or feet -trouble passing urine or change in the amount of urine -trouble sleeping Side effects that usually do not require medical attention (report to your doctor or health care professional if they continue or are bothersome): -headache -loss of appetite -nausea, stomach upset This list may not describe all possible side effects. Call your doctor for  medical advice about side effects. You may report side effects to FDA at 1-800-FDA-1088. Where should I keep my medicine? Keep out of the reach of children. This medicine may cause accidental overdose and death if taken by other adults, children, or pets. Mix any unused medicine with a substance like cat littler or coffee grounds. Then throw the medicine away in a sealed container like a sealed bag or a coffee can with a lid. Do not use the medicine after the expiration date. Store at room temperature between 15 and 25 degrees C (59 and 77 degrees F). Protect from heat and moisture. NOTE: This sheet is a summary. It may not cover all possible information. If you have questions about this medicine, talk to your doctor, pharmacist, or health care provider.  2018 Elsevier/Gold Standard (2014-07-22 19:26:28) Sinusitis, Adult Sinusitis is soreness and inflammation of your sinuses. Sinuses are hollow spaces in the bones around your face. Your sinuses are located:  Around your eyes.  In the middle of your forehead.  Behind your nose.  In your cheekbones.  Your sinuses and nasal passages are lined with a stringy fluid (mucus). Mucus normally drains out of your sinuses. When your nasal tissues become inflamed or swollen, the mucus can become trapped or blocked so air cannot flow through your sinuses. This allows bacteria, viruses, and funguses to grow, which leads to infection. Sinusitis can develop quickly and last for 7?10 days (acute) or for more than 12 weeks (chronic). Sinusitis often develops after a cold. What are the causes? This condition is caused by anything that creates swelling in the sinuses or stops mucus from draining, including:  Allergies.  Asthma.  Bacterial or viral infection.  Abnormally shaped bones between the nasal passages.  Nasal growths that contain mucus (nasal polyps).  Narrow sinus openings.  Pollutants, such as chemicals or irritants in the air.  A foreign  object stuck in the nose.  A fungal infection. This is rare.  What increases the risk? The following factors may make you more likely to develop this condition:  Having allergies or asthma.  Having had a recent cold or respiratory tract infection.  Having structural deformities or blockages in your nose or sinuses.  Having a weak immune system.  Doing a lot of swimming or diving.  Overusing nasal sprays.  Smoking.  What are the signs or symptoms? The main symptoms of this condition are pain and a feeling of pressure around the affected sinuses. Other symptoms include:  Upper toothache.  Earache.  Headache.  Bad breath.  Decreased sense of smell and taste.  A cough that may get worse at night.  Fatigue.  Fever.  Thick drainage from your nose. The drainage is often green and it may contain pus (purulent).  Stuffy nose or congestion.  Postnasal drip. This is when extra mucus collects in the throat or back of the nose.  Swelling and warmth over the affected sinuses.  Sore throat.  Sensitivity to light.  How is this diagnosed? This condition is diagnosed based on symptoms, a medical history, and a physical exam. To find out if  your condition is acute or chronic, your health care provider may:  Look in your nose for signs of nasal polyps.  Tap over the affected sinus to check for signs of infection.  View the inside of your sinuses using an imaging device that has a light attached (endoscope).  If your health care provider suspects that you have chronic sinusitis, you may also:  Be tested for allergies.  Have a sample of mucus taken from your nose (nasal culture) and checked for bacteria.  Have a mucus sample examined to see if your sinusitis is related to an allergy.  If your sinusitis does not respond to treatment and it lasts longer than 8 weeks, you may have an MRI or CT scan to check your sinuses. These scans also help to determine how severe your  infection is. In rare cases, a bone biopsy may be done to rule out more serious types of fungal sinus disease. How is this treated? Treatment for sinusitis depends on the cause and whether your condition is chronic or acute. If a virus is causing your sinusitis, your symptoms will go away on their own within 10 days. You may be given medicines to relieve your symptoms, including:  Topical nasal decongestants. They shrink swollen nasal passages and let mucus drain from your sinuses.  Antihistamines. These drugs block inflammation that is triggered by allergies. This can help to ease swelling in your nose and sinuses.  Topical nasal corticosteroids. These are nasal sprays that ease inflammation and swelling in your nose and sinuses.  Nasal saline washes. These rinses can help to get rid of thick mucus in your nose.  If your condition is caused by bacteria, you will be given an antibiotic medicine. If your condition is caused by a fungus, you will be given an antifungal medicine. Surgery may be needed to correct underlying conditions, such as narrow nasal passages. Surgery may also be needed to remove polyps. Follow these instructions at home: Medicines  Take, use, or apply over-the-counter and prescription medicines only as told by your health care provider. These may include nasal sprays.  If you were prescribed an antibiotic medicine, take it as told by your health care provider. Do not stop taking the antibiotic even if you start to feel better. Hydrate and Humidify  Drink enough water to keep your urine clear or pale yellow. Staying hydrated will help to thin your mucus.  Use a cool mist humidifier to keep the humidity level in your home above 50%.  Inhale steam for 10-15 minutes, 3-4 times a day or as told by your health care provider. You can do this in the bathroom while a hot shower is running.  Limit your exposure to cool or dry air. Rest  Rest as much as possible.  Sleep  with your head raised (elevated).  Make sure to get enough sleep each night. General instructions  Apply a warm, moist washcloth to your face 3-4 times a day or as told by your health care provider. This will help with discomfort.  Wash your hands often with soap and water to reduce your exposure to viruses and other germs. If soap and water are not available, use hand sanitizer.  Do not smoke. Avoid being around people who are smoking (secondhand smoke).  Keep all follow-up visits as told by your health care provider. This is important. Contact a health care provider if:  You have a fever.  Your symptoms get worse.  Your symptoms do not improve within  10 days. Get help right away if:  You have a severe headache.  You have persistent vomiting.  You have pain or swelling around your face or eyes.  You have vision problems.  You develop confusion.  Your neck is stiff.  You have trouble breathing. This information is not intended to replace advice given to you by your health care provider. Make sure you discuss any questions you have with your health care provider. Document Released: 11/17/2005 Document Revised: 07/13/2016 Document Reviewed: 09/12/2015 Elsevier Interactive Patient Education  2017 [employer].

## 2017-10-01 NOTE — Progress Notes (Signed)
Subjective:    Patient ID: Paige Robertson, female    DOB: 1976-08-06, 41 y.o.   MRN: [id number]  HPI  Patient is a 9 female in no acute distress, she was seen on 09/03/17 for otitis media and otitis externa  In her left ear. She was prescribed Augmentin x 10 days as well as ear drops.  She reports all of the above symptoms cleared completely and she started on Monday October 29 th with sore throat then the next day she had sinus drainage. She has had green nasal discharge. Facial pressure bilateral cheeks though worse in  left cheek. She also hs return of left ear pain.  She reports her children and husband have same symptoms.  She will be leaving out of town for work on Monday.   She has not been on any other antibiotics this year besides the last Augmentin prescribed on 09/03/17.  She denies any chronic history of sinusitis. She has never seen [facility] She denies any fever,  rash, chest pain, shortness of breath, nausea, vomiting, or diarrhea Blood pressure 108/64, pulse 96, temperature 99.4 F (37.4 C), resp. rate 16, weight 130 lb (59 kg), SpO2 98 %.  Defrancesco, Alanda Slim, MD- she reports she sees   ( Vasectomy - tested and cleared) - Denies any chance of pregnancy. October 18th 2018.   Review of Systems  Constitutional: Positive for fever (only today in office / no fever reducers taken ). Negative for activity change, appetite change, chills, diaphoresis, fatigue and unexpected weight change.  HENT: Positive for congestion, ear pain (left ear pain/ bilateral ears with pressure " clogged"), postnasal drip, rhinorrhea, sinus pressure (left side of cheek worse than right/ bilateral cheek pressure per patient), sneezing and sore throat (resolved since Monday). Negative for dental problem, drooling, ear discharge, facial swelling, hearing loss, mouth sores, nosebleeds, sinus pain, tinnitus, trouble swallowing and voice change.   Eyes: Negative.   Respiratory: Negative for  apnea, cough, choking, chest tightness, shortness of breath, wheezing and stridor.   Cardiovascular: Negative for chest pain, palpitations and leg swelling.  Endocrine: Negative.   Allergic/Immunologic:       Sulfur   Neurological: Positive for headaches (" sinus").  Hematological: Negative.   Psychiatric/Behavioral: Negative.        Objective:   Physical Exam  Constitutional: She is oriented to person, place, and time. She appears well-developed and well-nourished. She is active.  Non-toxic appearance. She does not have a sickly appearance. She does not appear ill. No distress. She is not intubated.  HENT:  Head: Normocephalic and atraumatic.  Right Ear: External ear normal.  Left Ear: External ear normal.  Nose: Nose normal.  Mouth/Throat: Oropharynx is clear and moist. No oropharyngeal exudate.  Eyes: Pupils are equal, round, and reactive to light. Conjunctivae, EOM and lids are normal. Left eye exhibits no exudate. No scleral icterus.  Neck: Trachea normal, normal range of motion, full passive range of motion without pain and phonation normal. Neck supple. Normal carotid pulses, no hepatojugular reflux and no JVD present. Carotid bruit is not present. No Brudzinski's sign noted.  Cardiovascular: Normal rate, regular rhythm, S1 normal, S2 normal, normal heart sounds and intact distal pulses.  Exam reveals no gallop, no distant heart sounds and no friction rub.   No murmur heard. Pulmonary/Chest: Effort normal and breath sounds normal. No accessory muscle usage. No apnea, no tachypnea and no bradypnea. She is not intubated. No respiratory distress.  Abdominal: Soft. Bowel sounds  are normal. She exhibits no distension. There is no tenderness.  Musculoskeletal: Normal range of motion.  Patient moves on and off of exam table and in room without difficulty. Gait is normal in hall and in room. Patient is oriented to person place time and situation. Patient answers questions appropriately and  engages in conversation.   Neurological: She is alert and oriented to person, place, and time.  Skin: Skin is dry. No rash noted. She is not diaphoretic. No erythema. No pallor.  Psychiatric: She has a normal mood and affect. Her behavior is normal. Judgment and thought content normal.  Nursing note and vitals reviewed.         Assessment & Plan:  Other acute sinusitis, recurrence not specified - Plan: CBC w/Diff, Comprehensive metabolic panel, POCT urine pregnancy  Meds ordered this encounter  Medications  . doxycycline (VIBRA-TABS) 100 MG tablet    Sig: Take 1 tablet (100 mg total) by mouth 2 (two) times daily.    Dispense:  20 tablet    Refill:  0   NO CBC or CMP on file in labs. She reports she sees Defrancesco, Alanda Slim, MD Regularly for OBGYN as well as for primary care. Will consider referral to ENT if patient requires additional viists.   Return to clinic at any time  if any new symptoms change, worsen or do not improve. Symptoms should improve  within 72 hours and if not improving you should call for an appointment at the clinic or be seen in urgent care/ED if clinic is closed. Your symptoms should not get worse from this point forward and if they do seek immediate medical attention.   Patient verbalized understanding of instructions and denies any further questions at this time.

## 2017-10-02 LAB — COMPREHENSIVE METABOLIC PANEL
ALT: 22 IU/L (ref 0–32)
AST: 17 IU/L (ref 0–40)
Albumin/Globulin Ratio: 1.7 (ref 1.2–2.2)
Albumin: 4.4 g/dL (ref 3.5–5.5)
Alkaline Phosphatase: 59 IU/L (ref 39–117)
BUN/Creatinine Ratio: 13 (ref 9–23)
BUN: 10 mg/dL (ref 6–24)
Bilirubin Total: 0.7 mg/dL (ref 0.0–1.2)
CO2: 26 mmol/L (ref 20–29)
Calcium: 9.5 mg/dL (ref 8.7–10.2)
Chloride: 101 mmol/L (ref 96–106)
Creatinine, Ser: 0.75 mg/dL (ref 0.57–1.00)
GFR calc Af Amer: 114 mL/min/{1.73_m2} (ref >59)
GFR calc non Af Amer: 99 mL/min/{1.73_m2} (ref >59)
Globulin, Total: 2.6 g/dL (ref 1.5–4.5)
Glucose: 100 mg/dL — ABNORMAL HIGH (ref 65–99)
Potassium: 4.4 mmol/L (ref 3.5–5.2)
Sodium: 141 mmol/L (ref 134–144)
Total Protein: 7 g/dL (ref 6.0–8.5)

## 2017-10-02 LAB — CBC WITH DIFFERENTIAL/PLATELET
Basophils Absolute: 0 10*3/uL (ref 0.0–0.2)
Basos: 1 %
EOS (ABSOLUTE): 0.1 10*3/uL (ref 0.0–0.4)
Eos: 3 %
Hematocrit: 39.8 % (ref 34.0–46.6)
Hemoglobin: 13.1 g/dL (ref 11.1–15.9)
Immature Grans (Abs): 0 10*3/uL (ref 0.0–0.1)
Immature Granulocytes: 0 %
Lymphocytes Absolute: 2.1 10*3/uL (ref 0.7–3.1)
Lymphs: 39 %
MCH: 30.5 pg (ref 26.6–33.0)
MCHC: 32.9 g/dL (ref 31.5–35.7)
MCV: 93 fL (ref 79–97)
Monocytes Absolute: 0.3 10*3/uL (ref 0.1–0.9)
Monocytes: 6 %
Neutrophils Absolute: 2.9 10*3/uL (ref 1.4–7.0)
Neutrophils: 51 %
Platelets: 307 10*3/uL (ref 150–379)
RBC: 4.3 x10E6/uL (ref 3.77–5.28)
RDW: 13.7 % (ref 12.3–15.4)
WBC: 5.5 10*3/uL (ref 3.4–10.8)

## 2017-11-04 ENCOUNTER — Ambulatory Visit
Admission: RE | Admit: 2017-11-04 | Discharge: 2017-11-04 | Disposition: A | Discharge status: Home / Self Care | Payer: BLUE CROSS/BLUE SHIELD | Source: Ambulatory Visit | Attending: Obstetrics and Gynecology | Admitting: Obstetrics and Gynecology

## 2017-11-04 DIAGNOSIS — Z1231 Encounter for screening mammogram for malignant neoplasm of breast: Secondary | ICD-10-CM | POA: Insufficient documentation

## 2017-12-30 ENCOUNTER — Other Ambulatory Visit: Payer: Self-pay

## 2017-12-30 ENCOUNTER — Encounter: Payer: Self-pay | Admitting: Obstetrics and Gynecology

## 2017-12-30 DIAGNOSIS — R109 Unspecified abdominal pain: Secondary | ICD-10-CM

## 2018-02-02 ENCOUNTER — Other Ambulatory Visit: Payer: Self-pay | Admitting: Student

## 2018-02-02 DIAGNOSIS — K9289 Other specified diseases of the digestive system: Secondary | ICD-10-CM

## 2018-02-02 DIAGNOSIS — R1010 Upper abdominal pain, unspecified: Secondary | ICD-10-CM

## 2018-02-02 DIAGNOSIS — K30 Functional dyspepsia: Secondary | ICD-10-CM | POA: Diagnosis not present

## 2018-02-16 ENCOUNTER — Ambulatory Visit
Admission: RE | Admit: 2018-02-16 | Discharge: 2018-02-16 | Disposition: A | Discharge status: Home / Self Care | Payer: BLUE CROSS/BLUE SHIELD | Source: Ambulatory Visit | Attending: Student | Admitting: Student

## 2018-02-16 DIAGNOSIS — K9289 Other specified diseases of the digestive system: Secondary | ICD-10-CM | POA: Insufficient documentation

## 2018-02-16 DIAGNOSIS — R1010 Upper abdominal pain, unspecified: Secondary | ICD-10-CM | POA: Diagnosis not present

## 2018-02-16 DIAGNOSIS — K30 Functional dyspepsia: Secondary | ICD-10-CM | POA: Insufficient documentation

## 2018-02-16 DIAGNOSIS — K219 Gastro-esophageal reflux disease without esophagitis: Secondary | ICD-10-CM | POA: Insufficient documentation

## 2018-05-10 DIAGNOSIS — Q825 Congenital non-neoplastic nevus: Secondary | ICD-10-CM | POA: Diagnosis not present

## 2018-05-10 DIAGNOSIS — L728 Other follicular cysts of the skin and subcutaneous tissue: Secondary | ICD-10-CM | POA: Diagnosis not present

## 2018-05-10 DIAGNOSIS — L80 Vitiligo: Secondary | ICD-10-CM | POA: Diagnosis not present

## 2018-05-10 DIAGNOSIS — D2372 Other benign neoplasm of skin of left lower limb, including hip: Secondary | ICD-10-CM | POA: Diagnosis not present

## 2018-05-18 NOTE — Progress Notes (Signed)
Patient ID: Paige Robertson, female   DOB: February 01, 1976, 42 y.o.   MRN: [id number] ANNUAL PREVENTATIVE CARE GYN  ENCOUNTER NOTE  Subjective:       Paige Robertson is a 42 y.o. G54P2002 female here for a routine annual gynecologic exam.  Current complaints: 1. Spots under arms- decrease pigment; dermatology is evaluating this and differential diagnosis includes vitiligo versus autoimmune disorder versus tinea versicolor.  Autoimmune screening labs are to be obtained today.  Cycles regular lasting only 1 day without dysmenorrhea. Bowel  function is normal. No new interval health issues.  GU history: Urinary frequency-5-6 per day Nocturia-none Stress urinary incontinence-present; most significant with exercise Urge symptoms-mild   Gynecologic History lmp-05/07/2018 Contraception: vasectomy Last Pap: 05/06/2017  n/n. Results were: normal. Last mammogram: 11/04/2017 bird 1. Results were: wnl  Obstetric History OB History  Gravida Para Term Preterm AB Living  2 2 2     2   SAB TAB Ectopic Multiple Live Births          2    # Outcome Date GA Lbr Len/2nd Weight Sex Delivery Anes PTL Lv  2 Term 2011   5 lb 2.1 oz (2.327 kg) F Vag-Spont   LIV  1 Term 2006   6 lb 2.2 oz (2.785 kg) F Vag-Spont   LIV    Past Medical History:  Diagnosis Date  . Hematuria   . Hyperlipemia   . Perimenopausal symptoms   . Vaginal discharge     Past Surgical History:  Procedure Laterality Date  . NO PAST SURGERIES      Current Outpatient Medications on File Prior to Visit  Medication Sig Dispense Refill  . doxycycline (VIBRA-TABS) 100 MG tablet Take 1 tablet (100 mg total) by mouth 2 (two) times daily. 20 tablet 0   No current facility-administered medications on file prior to visit.     Allergies  Allergen Reactions  . Sulfa Antibiotics     Social History   Socioeconomic History  . Marital status: Married    Spouse name: Not on file  . Number of children: Not on file  . Years of education: Not on  file  . Highest education level: Not on file  Occupational History  . Not on file  Social Needs  . Financial resource strain: Not on file  . Food insecurity:    Worry: Not on file    Inability: Not on file  . Transportation needs:    Medical: Not on file    Non-medical: Not on file  Tobacco Use  . Smoking status: Never Smoker  . Smokeless tobacco: Never Used  Substance and Sexual Activity  . Alcohol use: No  . Drug use: No  . Sexual activity: Yes    Comment: vasectomy  Lifestyle  . Physical activity:    Days per week: Not on file    Minutes per session: Not on file  . Stress: Not on file  Relationships  . Social connections:    Talks on phone: Not on file    Gets together: Not on file    Attends religious service: Not on file    Active member of club or organization: Not on file    Attends meetings of clubs or organizations: Not on file    Relationship status: Not on file  . Intimate partner violence:    Fear of current or ex partner: Not on file    Emotionally abused: Not on file    Physically abused: Not on file  Forced sexual activity: Not on file  Other Topics Concern  . Not on file  Social History Narrative  . Not on file    Family History  Problem Relation Age of Onset  . Diabetes Father   . Breast cancer Maternal Grandmother   . Breast cancer Paternal Aunt   . Colon cancer Neg Hx   . Ovarian cancer Neg Hx   . Heart disease Neg Hx     The following portions of the patient's history were reviewed and updated as appropriate: allergies, current medications, past family history, past medical history, past social history, past surgical history and problem list.  Review of Systems Review of Systems  Constitutional: Negative.   HENT: Negative.   Eyes: Negative.   Respiratory: Negative.   Cardiovascular: Negative.   Gastrointestinal: Negative.   Genitourinary: Negative.        Exercise-induced stress incontinence; occasionally wears pads; contemplating  incontinence pessary  Musculoskeletal: Negative.   Skin:       Hypopigmentation of bilateral axilla  Neurological: Negative.   Endo/Heme/Allergies: Negative.   Psychiatric/Behavioral: Negative.       Objective:   BP (!) 92/56   Pulse 78   Ht 5\' 2"  (1.575 m)   Wt 132 lb (59.9 kg)   LMP 05/07/2018 (Exact Date)   BMI 24.14 kg/m  ONSTITUTIONAL: Well-developed, well-nourished female in no acute distress.  PSYCHIATRIC: Normal mood and affect. Normal behavior. Normal judgment and thought content. [place]: Alert and oriented to person, place, and time. Normal muscle tone coordination. No cranial nerve deficit noted. HENT:  Normocephalic, atraumatic, External right and left ear normal.  EYES: Conjunctivae and EOM are normal. No scleral icterus.  NECK: Normal range of motion, supple, no masses.  Normal thyroid.  SKIN: Skin is warm and dry. No rash noted. Not diaphoretic. No erythema. No pallor. CARDIOVASCULAR: Normal heart rate noted, regular rhythm, no murmur. RESPIRATORY: Clear to auscultation bilaterally. Effort and breath sounds normal, no problems with respiration noted. BREASTS: Symmetric in size. No masses, skin changes, nipple drainage, or lymphadenopathy. ABDOMEN: Soft, no distention noted.  No tenderness, rebound or guarding.  BLADDER: Normal PELVIC:  External Genitalia: Normal  BUS: Normal  Vagina: Normal; good vaginal vault support without significant prolapse; no discharge  Cervix: Normal; eversion present; no cervical motion tenderness  Uterus: Normal; midplane, normal size and shape, mobile  Adnexa: Normal  RV: External Exam NormaI, No Rectal Masses and Normal Sphincter tone  MUSCULOSKELETAL: Normal range of motion. No tenderness.  No cyanosis, clubbing, or edema.  2+ distal pulses. LYMPHATIC: No Axillary, Supraclavicular, or Inguinal Adenopathy.    Assessment:   Annual gynecologic examination 42 y.o. Contraception: vasectomy Normal BMI  SUI, exercise  related Family history of hyperlipidemia Hypopigmentation in bilateral axilla, unclear etiology, followed by dermatology, desires autoimmune screening labs  Plan:  Pap:Due 2021 Mammogram: Ordered Stool Guaiac Testing:  Not Indicated Labs: thru - employer ANA and rheumatoid factor are ordered today Routine preventative health maintenance measures emphasized: Exercise/Diet/Weight control, Tobacco Warnings and Alcohol/Substance use risks  Discussed possible use of incontinence pessary for stress urinary incontinence during exercise Return to [place], CMA  Brayton Mars, MD  Note: This dictation was prepared with Dragon dictation along with smaller phrase technology. Any transcriptional errors that result from this process are unintentional.

## 2018-05-26 ENCOUNTER — Ambulatory Visit (INDEPENDENT_AMBULATORY_CARE_PROVIDER_SITE_OTHER): Payer: BLUE CROSS/BLUE SHIELD | Admitting: Obstetrics and Gynecology

## 2018-05-26 ENCOUNTER — Encounter: Payer: Self-pay | Admitting: Obstetrics and Gynecology

## 2018-05-26 VITALS — BP 92/56 | HR 78 | Ht 62.0 in | Wt 132.0 lb

## 2018-05-26 DIAGNOSIS — N393 Stress incontinence (female) (male): Secondary | ICD-10-CM

## 2018-05-26 DIAGNOSIS — Z01419 Encounter for gynecological examination (general) (routine) without abnormal findings: Secondary | ICD-10-CM | POA: Diagnosis not present

## 2018-05-26 DIAGNOSIS — L819 Disorder of pigmentation, unspecified: Secondary | ICD-10-CM | POA: Insufficient documentation

## 2018-05-26 DIAGNOSIS — Z1231 Encounter for screening mammogram for malignant neoplasm of breast: Secondary | ICD-10-CM

## 2018-05-26 NOTE — Patient Instructions (Addendum)
1.  No Pap smear is done.  Next Pap smear is due 2021. 2.  Mammogram is ordered 3.  Screening labs to be obtained through employer 4.  Continue with healthy eating and exercise 5.  Contraception-vasectomy 6.  Return in 1 year for annual exam 7.  ANA and rheumatoid factor labs are ordered today-screening for autoimmune disease  Health Maintenance, Female Adopting a healthy lifestyle and getting preventive care can go a long way to promote health and wellness. Talk with your health care provider about what schedule of regular examinations is right for you. This is a good chance for you to check in with your provider about disease prevention and staying healthy. In between checkups, there are plenty of things you can do on your own. Experts have done a lot of research about which lifestyle changes and preventive measures are most likely to keep you healthy. Ask your health care provider for more information. Weight and diet Eat a healthy diet  Be sure to include plenty of vegetables, fruits, low-fat dairy products, and lean protein.  Do not eat a lot of foods high in solid fats, added sugars, or salt.  Get regular exercise. This is one of the most important things you can do for your health. ? Most adults should exercise for at least 150 minutes each week. The exercise should increase your heart rate and make you sweat (moderate-intensity exercise). ? Most adults should also do strengthening exercises at least twice a week. This is in addition to the moderate-intensity exercise.  Maintain a healthy weight  Body mass index (BMI) is a measurement that can be used to identify possible weight problems. It estimates body fat based on height and weight. Your health care provider can help determine your BMI and help you achieve or maintain a healthy weight.  For females 60 years of age and older: ? A BMI below 18.5 is considered underweight. ? A BMI of 18.5 to 24.9 is normal. ? A BMI of 25 to 29.9  is considered overweight. ? A BMI of 30 and above is considered obese.  Watch levels of cholesterol and blood lipids  You should start having your blood tested for lipids and cholesterol at 42 years of age, then have this test every 5 years.  You may need to have your cholesterol levels checked more often if: ? Your lipid or cholesterol levels are high. ? You are older than 42 years of age. ? You are at high risk for heart disease.  Cancer screening Lung Cancer  Lung cancer screening is recommended for adults 17-62 years old who are at high risk for lung cancer because of a history of smoking.  A yearly low-dose CT scan of the lungs is recommended for people who: ? Currently smoke. ? Have quit within the past 15 years. ? Have at least a 30-pack-year history of smoking. A pack year is smoking an average of one pack of cigarettes a day for 1 year.  Yearly screening should continue until it has been 15 years since you quit.  Yearly screening should stop if you develop a health problem that would prevent you from having lung cancer treatment.  Breast Cancer  Practice breast self-awareness. This means understanding how your breasts normally appear and feel.  It also means doing regular breast self-exams. Let your health care provider know about any changes, no matter how small.  If you are in your 20s or 30s, you should have a clinical breast exam (CBE)  by a health care provider every 1-3 years as part of a regular health exam.  If you are 40 or older, have a CBE every year. Also consider having a breast X-ray (mammogram) every year.  If you have a family history of breast cancer, talk to your health care provider about genetic screening.  If you are at high risk for breast cancer, talk to your health care provider about having an MRI and a mammogram every year.  Breast cancer gene (BRCA) assessment is recommended for women who have family members with BRCA-related cancers.  BRCA-related cancers include: ? Breast. ? Ovarian. ? Tubal. ? Peritoneal cancers.  Results of the assessment will determine the need for genetic counseling and BRCA1 and BRCA2 testing.  Cervical Cancer Your health care provider may recommend that you be screened regularly for cancer of the pelvic organs (ovaries, uterus, and vagina). This screening involves a pelvic examination, including checking for microscopic changes to the surface of your cervix (Pap test). You may be encouraged to have this screening done every 3 years, beginning at age 21.  For women ages 30-65, health care providers may recommend pelvic exams and Pap testing every 3 years, or they may recommend the Pap and pelvic exam, combined with testing for human papilloma virus (HPV), every 5 years. Some types of HPV increase your risk of cervical cancer. Testing for HPV may also be done on women of any age with unclear Pap test results.  Other health care providers may not recommend any screening for nonpregnant women who are considered low risk for pelvic cancer and who do not have symptoms. Ask your health care provider if a screening pelvic exam is right for you.  If you have had past treatment for cervical cancer or a condition that could lead to cancer, you need Pap tests and screening for cancer for at least 20 years after your treatment. If Pap tests have been discontinued, your risk factors (such as having a new sexual partner) need to be reassessed to determine if screening should resume. Some women have medical problems that increase the chance of getting cervical cancer. In these cases, your health care provider may recommend more frequent screening and Pap tests.  Colorectal Cancer  This type of cancer can be detected and often prevented.  Routine colorectal cancer screening usually begins at 42 years of age and continues through 42 years of age.  Your health care provider may recommend screening at an earlier age if  you have risk factors for colon cancer.  Your health care provider may also recommend using home test kits to check for hidden blood in the stool.  A small camera at the end of a tube can be used to examine your colon directly (sigmoidoscopy or colonoscopy). This is done to check for the earliest forms of colorectal cancer.  Routine screening usually begins at age 50.  Direct examination of the colon should be repeated every 5-10 years through 42 years of age. However, you may need to be screened more often if early forms of precancerous polyps or small growths are found.  Skin Cancer  Check your skin from head to toe regularly.  Tell your health care provider about any new moles or changes in moles, especially if there is a change in a mole's shape or color.  Also tell your health care provider if you have a mole that is larger than the size of a pencil eraser.  Always use sunscreen. Apply sunscreen liberally and   repeatedly throughout the day.  Protect yourself by wearing long sleeves, pants, a wide-brimmed hat, and sunglasses whenever you are outside.  Heart disease, diabetes, and high blood pressure  High blood pressure causes heart disease and increases the risk of stroke. High blood pressure is more likely to develop in: ? People who have blood pressure in the high end of the normal range (130-139/85-89 mm Hg). ? People who are overweight or obese. ? People who are African American.  If you are 18-39 years of age, have your blood pressure checked every 3-5 years. If you are 40 years of age or older, have your blood pressure checked every year. You should have your blood pressure measured twice-once when you are at a hospital or clinic, and once when you are not at a hospital or clinic. Record the average of the two measurements. To check your blood pressure when you are not at a hospital or clinic, you can use: ? An automated blood pressure machine at a pharmacy. ? A home blood  pressure monitor.  If you are between 55 years and 79 years old, ask your health care provider if you should take aspirin to prevent strokes.  Have regular diabetes screenings. This involves taking a blood sample to check your fasting blood sugar level. ? If you are at a normal weight and have a low risk for diabetes, have this test once every three years after 42 years of age. ? If you are overweight and have a high risk for diabetes, consider being tested at a younger age or more often. Preventing infection Hepatitis B  If you have a higher risk for hepatitis B, you should be screened for this virus. You are considered at high risk for hepatitis B if: ? You were born in a country where hepatitis B is common. Ask your health care provider which countries are considered high risk. ? Your parents were born in a high-risk country, and you have not been immunized against hepatitis B (hepatitis B vaccine). ? You have HIV or AIDS. ? You use needles to inject street drugs. ? You live with someone who has hepatitis B. ? You have had sex with someone who has hepatitis B. ? You get hemodialysis treatment. ? You take certain medicines for conditions, including cancer, organ transplantation, and autoimmune conditions.  Hepatitis C  Blood testing is recommended for: ? Everyone born from 1945 through 1965. ? Anyone with known risk factors for hepatitis C.  Sexually transmitted infections (STIs)  You should be screened for sexually transmitted infections (STIs) including gonorrhea and chlamydia if: ? You are sexually active and are younger than 42 years of age. ? You are older than 42 years of age and your health care provider tells you that you are at risk for this type of infection. ? Your sexual activity has changed since you were last screened and you are at an increased risk for chlamydia or gonorrhea. Ask your health care provider if you are at risk.  If you do not have HIV, but are at risk,  it may be recommended that you take a prescription medicine daily to prevent HIV infection. This is called pre-exposure prophylaxis (PrEP). You are considered at risk if: ? You are sexually active and do not regularly use condoms or know the HIV status of your partner(s). ? You take drugs by injection. ? You are sexually active with a partner who has HIV.  Talk with your health care provider about whether you   are at high risk of being infected with HIV. If you choose to begin PrEP, you should first be tested for HIV. You should then be tested every 3 months for as long as you are taking PrEP. Pregnancy  If you are premenopausal and you may become pregnant, ask your health care provider about preconception counseling.  If you may become pregnant, take 400 to 800 micrograms (mcg) of folic acid every day.  If you want to prevent pregnancy, talk to your health care provider about birth control (contraception). Osteoporosis and menopause  Osteoporosis is a disease in which the bones lose minerals and strength with aging. This can result in serious bone fractures. Your risk for osteoporosis can be identified using a bone density scan.  If you are 56 years of age or older, or if you are at risk for osteoporosis and fractures, ask your health care provider if you should be screened.  Ask your health care provider whether you should take a calcium or vitamin D supplement to lower your risk for osteoporosis.  Menopause may have certain physical symptoms and risks.  Hormone replacement therapy may reduce some of these symptoms and risks. Talk to your health care provider about whether hormone replacement therapy is right for you. Follow these instructions at home:  Schedule regular health, dental, and eye exams.  Stay current with your immunizations.  Do not use any tobacco products including cigarettes, chewing tobacco, or electronic cigarettes.  If you are pregnant, do not drink  alcohol.  If you are breastfeeding, limit how much and how often you drink alcohol.  Limit alcohol intake to no more than 1 drink per day for nonpregnant women. One drink equals 12 ounces of beer, 5 ounces of wine, or 1 ounces of hard liquor.  Do not use street drugs.  Do not share needles.  Ask your health care provider for help if you need support or information about quitting drugs.  Tell your health care provider if you often feel depressed.  Tell your health care provider if you have ever been abused or do not feel safe at home. This information is not intended to replace advice given to you by your health care provider. Make sure you discuss any questions you have with your health care provider. Document Released: 06/02/2011 Document Revised: 04/24/2016 Document Reviewed: 08/21/2015 Elsevier Interactive Patient Education  [employer].

## 2018-05-31 ENCOUNTER — Encounter: Payer: Self-pay | Admitting: Obstetrics and Gynecology

## 2018-06-01 ENCOUNTER — Other Ambulatory Visit: Payer: Self-pay

## 2018-06-01 DIAGNOSIS — L819 Disorder of pigmentation, unspecified: Secondary | ICD-10-CM

## 2018-06-02 LAB — RHEUMATOID FACTOR: Rhuematoid fact SerPl-aCnc: <10 IU/mL (ref 0.0–13.9)

## 2018-06-02 LAB — ANA: Anti Nuclear Antibody(ANA): NEGATIVE

## 2018-09-13 ENCOUNTER — Other Ambulatory Visit: Payer: Self-pay

## 2018-09-13 MED ORDER — FLUCONAZOLE 150 MG PO TABS: 150.0000 mg | ORAL_TABLET | Freq: Every day | ORAL | 0 refills | Status: DC

## 2018-09-27 ENCOUNTER — Encounter: Payer: Self-pay | Admitting: Medical

## 2018-09-27 ENCOUNTER — Ambulatory Visit: Payer: Self-pay | Admitting: Medical

## 2018-09-27 VITALS — BP 109/74 | HR 71 | Temp 98.5°F | Resp 16 | Wt 135.6 lb

## 2018-09-27 DIAGNOSIS — L237 Allergic contact dermatitis due to plants, except food: Secondary | ICD-10-CM

## 2018-09-27 MED ORDER — PREDNISONE 10 MG (21) PO TBPK: ORAL_TABLET | ORAL | 0 refills | Status: DC

## 2018-09-27 NOTE — Progress Notes (Signed)
   Subjective:    Patient ID: Paige Robertson, female    DOB: 06/13/1976, 42 y.o.   MRN: [id number]  HPI  42 yo female , moved [place] about a week ago and ended up with a rash on the right side of the chin. Had used Benadrly gel on the area and it resolved. Then started on the abdomen, itchiy, not painful. Denies fever or chills , cough or chest pain. Changed soap to [facility] for sensitive skin, returned to regular soap this morning.   Review of Systems  Constitutional: Negative.   HENT: Negative.   Eyes: Negative.   Respiratory: Negative.   Cardiovascular: Negative.   Gastrointestinal: Negative.   Genitourinary: Negative.   Musculoskeletal: Negative.   Skin: Positive for rash (on abdomen).  Allergic/Immunologic: Positive for environmental allergies.  Hematological: Negative.   Psychiatric/Behavioral: Negative for behavioral problems, confusion, self-injury and suicidal ideas.       Objective:   Physical Exam  Constitutional: She is oriented to person, place, and time. She appears well-developed and well-nourished.  HENT:  Head: Normocephalic and atraumatic.  Eyes: Pupils are equal, round, and reactive to light. Conjunctivae and EOM are normal.  Neck: Normal range of motion.  Neurological: She is alert and oriented to person, place, and time.  Skin: Skin is warm. Rash noted. There is erythema.  Psychiatric: She has a normal mood and affect. Her behavior is normal. Judgment and thought content normal.  Nursing note and vitals reviewed.     maculopapular  Rash that is patchy on the  abdomen. Healing area on the right side under chin.    Assessment & Plan:  Contact Dermatitis- abdomen Meds ordered this encounter  Medications  . predniSONE (STERAPRED UNI-PAK 21 TAB) 10 MG (21) TBPK tablet    Sig: Take  6 tablets by mouth today then five tablets tomorrow then one less everyday thereafter. take food.    Dispense:  21 tablet    Refill:  0  Return in 3-5 days if worsening.  Avoid  heat to the area. If itching a lot at night may take Benadryl  25 mg  Po qhs. Patient verbalizes understanding and has no questions at discharge.

## 2018-09-27 NOTE — Patient Instructions (Signed)
Contact Dermatitis Dermatitis is redness, soreness, and swelling (inflammation) of the skin. Contact dermatitis is a reaction to certain substances that touch the skin. You either touched something that irritated your skin, or you have allergies to something you touched. Follow these instructions at home: Skin Care  Moisturize your skin as needed.  Apply cool compresses to the affected areas.  Try taking a bath with: ? Epsom salts. Follow the instructions on the package. You can get these at a pharmacy or grocery store. ? Baking soda. Pour a small amount into the bath as told by your doctor. ? Colloidal oatmeal. Follow the instructions on the package. You can get this at a pharmacy or grocery store.  Try applying baking soda paste to your skin. Stir water into baking soda until it looks like paste.  Do not scratch your skin.  Bathe less often.  Bathe in lukewarm water. Avoid using hot water. Medicines  Take or apply over-the-counter and prescription medicines only as told by your doctor.  If you were prescribed an antibiotic medicine, take or apply your antibiotic as told by your doctor. Do not stop taking the antibiotic even if your condition starts to get better. General instructions  Keep all follow-up visits as told by your doctor. This is important.  Avoid the substance that caused your reaction. If you do not know what caused it, keep a journal to try to track what caused it. Write down: ? What you eat. ? What cosmetic products you use. ? What you drink. ? What you wear in the affected area. This includes jewelry.  If you were given a bandage (dressing), take care of it as told by your doctor. This includes when to change and remove it. Contact a doctor if:  You do not get better with treatment.  Your condition gets worse.  You have signs of infection such as: ? Swelling. ? Tenderness. ? Redness. ? Soreness. ? Warmth.  You have a fever.  You have new  symptoms. Get help right away if:  You have a very bad headache.  You have neck pain.  Your neck is stiff.  You throw up (vomit).  You feel very sleepy.  You see red streaks coming from the affected area.  Your bone or joint underneath the affected area becomes painful after the skin has healed.  The affected area turns darker.  You have trouble breathing. This information is not intended to replace advice given to you by your health care provider. Make sure you discuss any questions you have with your health care provider. Document Released: 09/14/2009 Document Revised: 04/24/2016 Document Reviewed: 04/04/2015 Elsevier Interactive Patient Education  2018 [employer]  

## 2018-11-05 ENCOUNTER — Ambulatory Visit
Admission: RE | Admit: 2018-11-05 | Discharge: 2018-11-05 | Disposition: A | Discharge status: Home / Self Care | Payer: BLUE CROSS/BLUE SHIELD | Source: Ambulatory Visit | Attending: Obstetrics and Gynecology | Admitting: Obstetrics and Gynecology

## 2018-11-05 DIAGNOSIS — Z1231 Encounter for screening mammogram for malignant neoplasm of breast: Secondary | ICD-10-CM

## 2018-11-08 ENCOUNTER — Other Ambulatory Visit: Payer: Self-pay | Admitting: Obstetrics and Gynecology

## 2018-11-08 DIAGNOSIS — N6489 Other specified disorders of breast: Secondary | ICD-10-CM

## 2018-11-08 DIAGNOSIS — R928 Other abnormal and inconclusive findings on diagnostic imaging of breast: Secondary | ICD-10-CM

## 2018-11-18 ENCOUNTER — Ambulatory Visit
Admission: RE | Admit: 2018-11-18 | Discharge: 2018-11-18 | Disposition: A | Discharge status: Home / Self Care | Payer: BLUE CROSS/BLUE SHIELD | Source: Ambulatory Visit | Attending: Obstetrics and Gynecology | Admitting: Obstetrics and Gynecology

## 2018-11-18 DIAGNOSIS — N6489 Other specified disorders of breast: Secondary | ICD-10-CM

## 2018-11-18 DIAGNOSIS — R928 Other abnormal and inconclusive findings on diagnostic imaging of breast: Secondary | ICD-10-CM

## 2018-11-18 DIAGNOSIS — R922 Inconclusive mammogram: Secondary | ICD-10-CM | POA: Diagnosis not present

## 2019-06-01 ENCOUNTER — Encounter: Payer: BLUE CROSS/BLUE SHIELD | Admitting: Obstetrics and Gynecology

## 2019-07-06 ENCOUNTER — Encounter: Payer: Self-pay | Admitting: Medical

## 2019-08-26 DIAGNOSIS — D225 Melanocytic nevi of trunk: Secondary | ICD-10-CM | POA: Diagnosis not present

## 2019-08-26 DIAGNOSIS — D2271 Melanocytic nevi of right lower limb, including hip: Secondary | ICD-10-CM | POA: Diagnosis not present

## 2019-08-26 DIAGNOSIS — D2262 Melanocytic nevi of left upper limb, including shoulder: Secondary | ICD-10-CM | POA: Diagnosis not present

## 2019-08-26 DIAGNOSIS — D2261 Melanocytic nevi of right upper limb, including shoulder: Secondary | ICD-10-CM | POA: Diagnosis not present

## 2020-01-05 ENCOUNTER — Ambulatory Visit
Admission: RE | Admit: 2020-01-05 | Discharge: 2020-01-05 | Disposition: A | Discharge status: Home / Self Care | Payer: BC Managed Care – PPO | Source: Ambulatory Visit | Attending: Obstetrics and Gynecology | Admitting: Obstetrics and Gynecology

## 2020-01-05 ENCOUNTER — Other Ambulatory Visit: Payer: Self-pay

## 2020-01-05 DIAGNOSIS — D649 Anemia, unspecified: Secondary | ICD-10-CM | POA: Diagnosis not present

## 2020-01-05 DIAGNOSIS — D509 Iron deficiency anemia, unspecified: Secondary | ICD-10-CM | POA: Insufficient documentation

## 2020-01-05 LAB — PREPARE RBC (CROSSMATCH)

## 2020-01-05 LAB — ABO/RH: ABO/RH(D): O POS

## 2020-01-05 LAB — HEMOGLOBIN AND HEMATOCRIT, BLOOD
HCT: 27.3 % — ABNORMAL LOW (ref 36.0–46.0)
Hemoglobin: 7.8 g/dL — ABNORMAL LOW (ref 12.0–15.0)

## 2020-01-05 LAB — CBC
HCT: 19.4 % — ABNORMAL LOW (ref 36.0–46.0)
Hemoglobin: 4.7 g/dL — CL (ref 12.0–15.0)
MCH: 15.6 pg — ABNORMAL LOW (ref 26.0–34.0)
MCHC: 24.2 g/dL — ABNORMAL LOW (ref 30.0–36.0)
MCV: 64.2 fL — ABNORMAL LOW (ref 80.0–100.0)
Platelets: 328 10*3/uL (ref 150–400)
RBC: 3.02 MIL/uL — ABNORMAL LOW (ref 3.87–5.11)
RDW: 19.5 % — ABNORMAL HIGH (ref 11.5–15.5)
WBC: 5.3 10*3/uL (ref 4.0–10.5)
nRBC: 0 % (ref 0.0–0.2)

## 2020-01-05 MED ORDER — ACETAMINOPHEN 325 MG PO TABS
ORAL_TABLET | ORAL | Status: AC
  Filled 2020-01-05: qty 2

## 2020-01-05 MED ORDER — ACETAMINOPHEN 325 MG PO TABS
650.0000 mg | ORAL_TABLET | Freq: Once | ORAL | Status: AC
  Administered 2020-01-05: 650 mg via ORAL

## 2020-01-05 MED ORDER — DIPHENHYDRAMINE HCL 25 MG PO CAPS
25.0000 mg | ORAL_CAPSULE | Freq: Once | ORAL | Status: AC
  Administered 2020-01-05: 15:00:00 25 mg via ORAL

## 2020-01-05 MED ORDER — DIPHENHYDRAMINE HCL 25 MG PO CAPS
ORAL_CAPSULE | ORAL | Status: AC
  Filled 2020-01-05: qty 1

## 2020-01-05 MED ORDER — SODIUM CHLORIDE FLUSH 0.9 % IV SOLN
INTRAVENOUS | Status: AC
  Filled 2020-01-05: qty 10

## 2020-01-05 MED ORDER — SODIUM CHLORIDE 0.9% IV SOLUTION: Freq: Once | INTRAVENOUS | Status: DC

## 2020-01-05 NOTE — Progress Notes (Signed)
Pt presented to outpatient clinic for regular annual exam. CBC after visit returned a validated hemoglobin of 4.7. Pt presenting to hospital for 2u pRBCs, with plan for outpatient follow-up for profound anemia.

## 2020-01-05 NOTE — Discharge Instructions (Signed)

## 2020-01-06 LAB — TYPE AND SCREEN
ABO/RH(D): O POS
Antibody Screen: NEGATIVE
Unit division: 0
Unit division: 0

## 2020-01-06 LAB — BPAM RBC
Blood Product Expiration Date: 202103022359
Blood Product Expiration Date: 202103062359
ISSUE DATE / TIME: 202102041531
ISSUE DATE / TIME: 202102041725
Unit Type and Rh: 5100
Unit Type and Rh: 5100

## 2020-01-09 ENCOUNTER — Other Ambulatory Visit (HOSPITAL_COMMUNITY): Payer: Self-pay | Admitting: Obstetrics and Gynecology

## 2020-01-09 ENCOUNTER — Other Ambulatory Visit: Payer: Self-pay | Admitting: Obstetrics and Gynecology

## 2020-01-09 DIAGNOSIS — R600 Localized edema: Secondary | ICD-10-CM

## 2020-01-11 ENCOUNTER — Telehealth: Payer: Self-pay | Admitting: Obstetrics and Gynecology

## 2020-01-11 NOTE — Telephone Encounter (Signed)
Patient called saying she needed documentation of blood work from 2019 when she was a patient with [place]. She called specifically asking for you, she was a patient of Dr. Keturah Barre, could you please advise this patient.   -Farrell Ours

## 2020-01-13 ENCOUNTER — Ambulatory Visit
Admission: RE | Admit: 2020-01-13 | Discharge: 2020-01-13 | Disposition: A | Discharge status: Home / Self Care | Payer: BC Managed Care – PPO | Source: Ambulatory Visit | Attending: Obstetrics and Gynecology | Admitting: Obstetrics and Gynecology

## 2020-01-13 ENCOUNTER — Other Ambulatory Visit: Payer: Self-pay

## 2020-01-13 DIAGNOSIS — R600 Localized edema: Secondary | ICD-10-CM | POA: Diagnosis not present

## 2020-01-13 NOTE — Telephone Encounter (Signed)
Pt aware no hgb drawn in 2019.

## 2020-01-26 ENCOUNTER — Other Ambulatory Visit: Payer: Self-pay | Admitting: Obstetrics and Gynecology

## 2020-01-26 DIAGNOSIS — Z1231 Encounter for screening mammogram for malignant neoplasm of breast: Secondary | ICD-10-CM

## 2020-02-22 ENCOUNTER — Ambulatory Visit
Admission: RE | Admit: 2020-02-22 | Discharge: 2020-02-22 | Disposition: A | Discharge status: Home / Self Care | Payer: BC Managed Care – PPO | Source: Ambulatory Visit | Attending: Obstetrics and Gynecology | Admitting: Obstetrics and Gynecology

## 2020-02-22 DIAGNOSIS — Z1231 Encounter for screening mammogram for malignant neoplasm of breast: Secondary | ICD-10-CM | POA: Diagnosis not present

## 2020-02-23 ENCOUNTER — Encounter: Payer: Self-pay | Admitting: Oncology

## 2020-02-23 NOTE — Progress Notes (Signed)
Patient contacted for new patient visit. Notified her of reason for referral and what to expect at first appointment. Patient voiced understanding and had no further questions.

## 2020-02-24 ENCOUNTER — Inpatient Hospital Stay: Payer: BC Managed Care – PPO

## 2020-02-24 ENCOUNTER — Ambulatory Visit
Admission: RE | Admit: 2020-02-24 | Discharge: 2020-02-24 | Disposition: A | Discharge status: Home / Self Care | Payer: BC Managed Care – PPO | Source: Ambulatory Visit | Attending: Oncology | Admitting: Oncology

## 2020-02-24 ENCOUNTER — Inpatient Hospital Stay: Payer: BC Managed Care – PPO | Attending: Oncology | Admitting: Oncology

## 2020-02-24 DIAGNOSIS — R0602 Shortness of breath: Secondary | ICD-10-CM

## 2020-02-24 DIAGNOSIS — D508 Other iron deficiency anemias: Secondary | ICD-10-CM

## 2020-02-24 LAB — CBC WITH DIFFERENTIAL/PLATELET
Abs Immature Granulocytes: 0.01 10*3/uL (ref 0.00–0.07)
Basophils Absolute: 0 10*3/uL (ref 0.0–0.1)
Basophils Relative: 1 %
Eosinophils Absolute: 0.1 10*3/uL (ref 0.0–0.5)
Eosinophils Relative: 1 %
HCT: 37.3 % (ref 36.0–46.0)
Hemoglobin: 11.4 g/dL — ABNORMAL LOW (ref 12.0–15.0)
Immature Granulocytes: 0 %
Lymphocytes Relative: 32 %
Lymphs Abs: 2 10*3/uL (ref 0.7–4.0)
MCH: 25.9 pg — ABNORMAL LOW (ref 26.0–34.0)
MCHC: 30.6 g/dL (ref 30.0–36.0)
MCV: 84.8 fL (ref 80.0–100.0)
Monocytes Absolute: 0.4 10*3/uL (ref 0.1–1.0)
Monocytes Relative: 6 %
Neutro Abs: 3.8 10*3/uL (ref 1.7–7.7)
Neutrophils Relative %: 60 %
Platelets: 232 10*3/uL (ref 150–400)
RBC: 4.4 MIL/uL (ref 3.87–5.11)
RDW: 20.9 % — ABNORMAL HIGH (ref 11.5–15.5)
WBC: 6.2 10*3/uL (ref 4.0–10.5)
nRBC: 0 % (ref 0.0–0.2)

## 2020-02-24 LAB — IRON AND TIBC
Iron: 27 ug/dL — ABNORMAL LOW (ref 28–170)
Saturation Ratios: 6 % — ABNORMAL LOW (ref 10.4–31.8)
TIBC: 482 ug/dL — ABNORMAL HIGH (ref 250–450)
UIBC: 455 ug/dL

## 2020-02-24 LAB — URINALYSIS, COMPLETE (UACMP) WITH MICROSCOPIC
Bilirubin Urine: NEGATIVE
Glucose, UA: NEGATIVE mg/dL
Ketones, ur: NEGATIVE mg/dL
Nitrite: NEGATIVE
Protein, ur: NEGATIVE mg/dL
Specific Gravity, Urine: 1.006 (ref 1.005–1.030)
pH: 7 (ref 5.0–8.0)

## 2020-02-24 LAB — RETIC PANEL
Immature Retic Fract: 4.6 % (ref 2.3–15.9)
RBC.: 4.38 MIL/uL (ref 3.87–5.11)
Retic Count, Absolute: 25 10*3/uL (ref 19.0–186.0)
Retic Ct Pct: 0.6 % (ref 0.4–3.1)
Reticulocyte Hemoglobin: 27.6 pg — ABNORMAL LOW (ref >27.9)

## 2020-02-24 LAB — LACTATE DEHYDROGENASE: LDH: 126 U/L (ref 98–192)

## 2020-02-24 LAB — FERRITIN: Ferritin: 7 ng/mL — ABNORMAL LOW (ref 11–307)

## 2020-02-24 NOTE — Progress Notes (Signed)
Hematology/Oncology Consult note [facility] Telephone:([phone] Fax:([phone]   Patient Care Team: Benjaman Kindler, MD as PCP - General (Obstetrics and Gynecology)  REFERRING PROVIDER: Gladstone Lighter, MD CHIEF COMPLAINTS/REASON FOR VISIT:  Evaluation of iron deficiency anemia  HISTORY OF PRESENTING ILLNESS:  Paige Robertson is a  44 y.o.  female with PMH listed below was seen in consultation at the request of Gladstone Lighter, MD   for evaluation of iron deficiency anemia.   Reviewed patient's recent labs  Patient presented for well woman visit to Dr. Joanna Puff office at that time she has noticed fatigue, shortness of breath. Reports that her menstrual period has been regular, very light. 01/05/2020 labs revealed anemia with hemoglobin of 4.7, status post 2 units of PRBC transfusion. 01/11/2020 hemoglobin increased to 8.5, iron 19, TIBC 543, transferrin 388, ferritin 5. Erythropoietin level elevated at 29.5. Direct Coombs testing negative.  LDH normal.  Haptoglobin within normal limits 01/18/2020 hemoglobin 7.4, hematocrit 26.7, Celiac panel negative. 01/25/2020, hemoglobin 8.3, hematocrit 29.5, iron 22, TIBC 501, transferrin 357, saturation 94. Patient is currently taking ferrous sulfate 325 mg daily.  She reports that iron upset her stomach.  She has now take it every other day Patient denies any epigastric pain.  Patient underwent EGD and colonoscopy-reports are not available to me via care everywhere.  Per patient, no bleeding source was found.  Patient reports that her fatigue has improved recently.   Review of Systems  Constitutional: Negative for appetite change, chills, fatigue and fever.  HENT:   Negative for hearing loss and voice change.   Eyes: Negative for eye problems.  Respiratory: Negative for chest tightness and cough.   Cardiovascular: Negative for chest pain.  Gastrointestinal: Negative for abdominal distention, abdominal  pain and blood in stool.  Endocrine: Negative for hot flashes.  Genitourinary: Negative for difficulty urinating and frequency.   Musculoskeletal: Negative for arthralgias.  Skin: Negative for itching and rash.  Neurological: Negative for extremity weakness.  Hematological: Negative for adenopathy.  Psychiatric/Behavioral: Negative for confusion.    MEDICAL HISTORY:  Past Medical History:  Diagnosis Date  . Acid reflux   . Hematuria   . Hyperlipemia   . Perimenopausal symptoms   . Vaginal discharge     SURGICAL HISTORY: Past Surgical History:  Procedure Laterality Date  . NO PAST SURGERIES      SOCIAL HISTORY: Social History   Socioeconomic History  . Marital status: Married    Spouse name: Not on file  . Number of children: Not on file  . Years of education: Not on file  . Highest education level: Not on file  Occupational History  . Not on file  Tobacco Use  . Smoking status: Never Smoker  . Smokeless tobacco: Never Used  Substance and Sexual Activity  . Alcohol use: No  . Drug use: No  . Sexual activity: Yes    Comment: vasectomy  Other Topics Concern  . Not on file  Social History Narrative  . Not on file   Social Determinants of Health   Financial Resource Strain:   . Difficulty of Paying Living Expenses:   Food Insecurity:   . Worried About Charity fundraiser in the Last Year:   . Arboriculturist in the Last Year:   Transportation Needs:   . Film/video editor (Medical):   Marland Kitchen Lack of Transportation (Non-Medical):   Physical Activity:   . Days of Exercise per Week:   . Minutes of Exercise  per Session:   Stress:   . Feeling of Stress :   Social Connections:   . Frequency of Communication with Friends and Family:   . Frequency of Social Gatherings with Friends and Family:   . Attends Religious Services:   . Active Member of Clubs or Organizations:   . Attends Archivist Meetings:   Marland Kitchen Marital Status:   Intimate Partner Violence:    . Fear of Current or Ex-Partner:   . Emotionally Abused:   Marland Kitchen Physically Abused:   . Sexually Abused:     FAMILY HISTORY: Family History  Problem Relation Age of Onset  . Diabetes Father   . Stroke Father   . Breast cancer Maternal Grandmother   . Breast cancer Paternal Aunt   . High Cholesterol Mother   . Colon cancer Neg Hx   . Ovarian cancer Neg Hx   . Heart disease Neg Hx     ALLERGIES:  is allergic to sulfa antibiotics.  MEDICATIONS:  Current Outpatient Medications  Medication Sig Dispense Refill  . cetirizine (ZYRTEC) 10 MG tablet Take 10 mg by mouth daily.    . ferrous sulfate 325 (65 FE) MG tablet Take 325 mg by mouth every other day.    . fluconazole (DIFLUCAN) 150 MG tablet Take 1 tablet (150 mg total) by mouth daily. 1 tablet 0  . Multiple Vitamins-Calcium (ONE-A-DAY WOMENS FORMULA) TABS Take 1 tablet by mouth daily.     No current facility-administered medications for this visit.     PHYSICAL EXAMINATION: ECOG PERFORMANCE STATUS: 0 - Asymptomatic There were no vitals filed for this visit. There were no vitals filed for this visit.  Physical Exam Constitutional:      General: She is not in acute distress. HENT:     Head: Normocephalic and atraumatic.  Eyes:     General: No scleral icterus. Cardiovascular:     Rate and Rhythm: Normal rate and regular rhythm.     Heart sounds: Normal heart sounds.  Pulmonary:     Effort: Pulmonary effort is normal. No respiratory distress.     Breath sounds: No wheezing.  Abdominal:     General: Bowel sounds are normal. There is no distension.     Palpations: Abdomen is soft.  Musculoskeletal:        General: No deformity. Normal range of motion.     Cervical back: Normal range of motion and neck supple.  Skin:    General: Skin is warm and dry.     Findings: No erythema or rash.     Comments: Bilateral axillary skin hypopigmentation  Neurological:     Mental Status: She is alert and oriented to person, place,  and time. Mental status is at baseline.     Cranial Nerves: No cranial nerve deficit.     Coordination: Coordination normal.  Psychiatric:        Mood and Affect: Mood normal.       CMP Latest Ref Rng & Units 10/01/2017  Glucose 65 - 99 mg/dL 100(H)  BUN 6 - 24 mg/dL 10  Creatinine 0.57 - 1.00 mg/dL 0.75  Sodium 134 - 144 mmol/L 141  Potassium 3.5 - 5.2 mmol/L 4.4  Chloride 96 - 106 mmol/L 101  CO2 20 - 29 mmol/L 26  Calcium 8.7 - 10.2 mg/dL 9.5  Total Protein 6.0 - 8.5 g/dL 7.0  Total Bilirubin 0.0 - 1.2 mg/dL 0.7  Alkaline Phos 39 - 117 IU/L 59  AST 0 - 40 IU/L  17  ALT 0 - 32 IU/L 22   CBC Latest Ref Rng & Units 02/24/2020  WBC 4.0 - 10.5 K/uL 6.2  Hemoglobin 12.0 - 15.0 g/dL 11.4(L)  Hematocrit 36.0 - 46.0 % 37.3  Platelets 150 - 400 K/uL 232     LABORATORY DATA:  I have reviewed the data as listed Lab Results  Component Value Date   WBC 6.2 02/24/2020   HGB 11.4 (L) 02/24/2020   HCT 37.3 02/24/2020   MCV 84.8 02/24/2020   PLT 232 02/24/2020   No results for input(s): NA, K, CL, CO2, GLUCOSE, BUN, CREATININE, CALCIUM, GFRNONAA, GFRAA, PROT, ALBUMIN, AST, ALT, ALKPHOS, BILITOT, BILIDIR, IBILI in the last 8760 hours. Iron/TIBC/Ferritin/ %Sat    Component Value Date/Time   IRON 27 (L) 02/24/2020 1158   TIBC 482 (H) 02/24/2020 1158   FERRITIN 7 (L) 02/24/2020 1158   IRONPCTSAT 6 (L) 02/24/2020 1158     RADIOGRAPHIC STUDIES: I have personally reviewed the radiological images as listed and agreed with the findings in the report. DG Chest 2 View  Result Date: 02/24/2020 CLINICAL DATA:  Short of breath, anemia EXAM: CHEST - 2 VIEW COMPARISON:  None. FINDINGS: Frontal and lateral views of the chest demonstrate an unremarkable cardiac silhouette. There is no airspace disease, effusion, or pneumothorax. Right convex scoliosis of the thoracic spine. No acute bony abnormalities. IMPRESSION: 1. No acute intrathoracic process. Electronically Signed   By: Randa Ngo M.D.    On: 02/24/2020 16:12   MM 3D SCREEN BREAST BILATERAL  Result Date: 02/23/2020 CLINICAL DATA:  Screening. EXAM: DIGITAL SCREENING BILATERAL MAMMOGRAM WITH TOMO AND CAD COMPARISON:  Previous exam(s). ACR Breast Density Category c: The breast tissue is heterogeneously dense, which may obscure small masses. FINDINGS: In the right breast, possible distortion warrants further evaluation. In the left breast, no findings suspicious for malignancy. Images were processed with CAD. IMPRESSION: Further evaluation is suggested for possible distortion in the right breast. RECOMMENDATION: Diagnostic mammogram and possibly ultrasound of the right breast. (Code:[id number]) The patient will be contacted regarding the findings, and additional imaging will be scheduled. BI-RADS CATEGORY  0: Incomplete. Need additional imaging evaluation and/or prior mammograms for comparison. Electronically Signed   By: Margarette Canada M.D.   On: 02/23/2020 10:43       ASSESSMENT & PLAN:  1. Other iron deficiency anemia   2. SOB (shortness of breath)   Previous labs are reviewed and discussed with patient. Consistent with iron deficiency anemia. Option of continue oral iron supplementation -try other iron supplementation versus IV iron infusion were discussed Allergy reactions/infusion reaction including anaphylactic reaction discussed with patient. Other side effects include but not limited to high blood pressure, skin rash, weight gain, leg swelling, etc. Patient voices understanding and willing to proceed if needed..  Etiology of iron deficiency remains in clear.  Patient had negative gastroenterology work-up including EGD/colonoscopy-per patient I will check CBC, iron, TIBC ferritin, UA, LDH, reticulocyte panel. Labs reviewed.  Consistent with iron deficiency, hemoglobin has improved to 11.1. iron panel is still low.  She may continue oral iron supplementation vs proceed with IV venofer weekly x 2.  UA showed large amount of  leukocytes, no RBC, small hemoglobin urine dipstic- will ask if patient has any urinary symptoms.   # SOB, can be due to anemia. Check CXR  Follow up in 6 weeks.  Orders Placed This Encounter  Procedures  . DG Chest 2 View    Standing Status:   Future  Number of Occurrences:   1    Standing Expiration Date:   02/23/2021    Order Specific Question:   Reason for Exam (SYMPTOM  OR DIAGNOSIS REQUIRED)    Answer:   SOB    Order Specific Question:   Is patient pregnant?    Answer:   No    Order Specific Question:   Preferred imaging location?    Answer:   [facility]    Order Specific Question:   Radiology Contrast Protocol - do NOT remove file path    Answer:   [email or web address]  . Iron and TIBC    Standing Status:   Future    Number of Occurrences:   1    Standing Expiration Date:   08/26/2021  . Ferritin    Standing Status:   Future    Number of Occurrences:   1    Standing Expiration Date:   08/26/2021  . CBC with Differential/Platelet    Standing Status:   Future    Number of Occurrences:   1    Standing Expiration Date:   08/26/2021  . Retic Panel    Standing Status:   Future    Number of Occurrences:   1    Standing Expiration Date:   08/26/2021  . Urinalysis, Complete w Microscopic  . Lactate dehydrogenase    Standing Status:   Future    Number of Occurrences:   1    Standing Expiration Date:   02/23/2021    All questions were answered. The patient knows to call the clinic with any problems questions or concerns.  Cc Gladstone Lighter, MD  Return of visit: 6 weeks.  Thank you for this kind referral and the opportunity to participate in the care of this patient. A copy of today's note is routed to referring provider   Earlie Server, MD, PhD Hematology Oncology [place] [facility] 02/24/2020

## 2020-02-28 ENCOUNTER — Other Ambulatory Visit: Payer: Self-pay | Admitting: Obstetrics and Gynecology

## 2020-02-28 ENCOUNTER — Encounter: Payer: Self-pay | Admitting: Oncology

## 2020-02-28 DIAGNOSIS — R928 Other abnormal and inconclusive findings on diagnostic imaging of breast: Secondary | ICD-10-CM

## 2020-02-29 ENCOUNTER — Telehealth: Payer: Self-pay

## 2020-02-29 NOTE — Telephone Encounter (Signed)
Change the 4/12 appt for 6 week lab (2 days prior)/MD-virtual..Esau Grew

## 2020-02-29 NOTE — Telephone Encounter (Signed)
Patient does not have urimary symptoms but she has been having a discharge for a while.    She prefers to stay on oral iron at this time.  Do you still want to move her next appt out 6 weeks?  [place] for virtual?

## 2020-02-29 NOTE — Telephone Encounter (Signed)
-----   Message from Earlie Server, MD sent at 02/28/2020 11:08 PM EDT ----- Please ask if patient has any urinary symptoms. If no, that's fine.  Also let her know that her hemoglobin has significantly improved. Iron panel is still low.  She can continue oral iron supplementation or IV venofer can be a good choice for her. If she is willing to proceed with IV venofer, please arrange her to get weekly Venofer x 2.   Reschedule her follow up appt to 6 weeks, lab prior, MD +/- Venofer. Thanks. Marland Kitchen

## 2020-02-29 NOTE — Telephone Encounter (Signed)
Virtual is fine.  6 weeks follow up. Ok to try oral iron.  Please encourage her to discuss discharge with PCP, advise gyn evaluation, assuming this is vaginal discharge. Thank you

## 2020-03-01 NOTE — Telephone Encounter (Signed)
Done..  Pts 4/12 appt has been r/s as requested.. I called and made her aware of her NEW scheduled 6 week F/u lab appt 2 days prior to having a Mychart appt  Visit.Paige Robertson

## 2020-03-02 ENCOUNTER — Ambulatory Visit
Admission: RE | Admit: 2020-03-02 | Discharge: 2020-03-02 | Disposition: A | Discharge status: Home / Self Care | Payer: BC Managed Care – PPO | Source: Ambulatory Visit | Attending: Obstetrics and Gynecology | Admitting: Obstetrics and Gynecology

## 2020-03-02 DIAGNOSIS — R928 Other abnormal and inconclusive findings on diagnostic imaging of breast: Secondary | ICD-10-CM

## 2020-03-12 ENCOUNTER — Telehealth: Payer: BC Managed Care – PPO | Admitting: Oncology

## 2020-04-05 ENCOUNTER — Telehealth: Payer: Self-pay | Admitting: *Deleted

## 2020-04-05 NOTE — Telephone Encounter (Signed)
Pt called and stated that Dr. Tressia Miners said that she need to be seen here to have a IV Iron Infusion. pt is already sched to have labs drawn on 5/12 and a Mychart visit on 5/14 MD Advised. Per Dr. Tasia Catchings to move appts up and to schedule her for MD/+/- *NEW* Venofer. Pt is aware of her NEW sched appt date and time.

## 2020-04-09 ENCOUNTER — Other Ambulatory Visit: Payer: BC Managed Care – PPO

## 2020-04-09 ENCOUNTER — Telehealth: Payer: BC Managed Care – PPO | Admitting: Oncology

## 2020-04-10 ENCOUNTER — Encounter: Payer: Self-pay | Admitting: Oncology

## 2020-04-10 ENCOUNTER — Other Ambulatory Visit: Payer: Self-pay

## 2020-04-10 ENCOUNTER — Inpatient Hospital Stay: Payer: BC Managed Care – PPO

## 2020-04-10 ENCOUNTER — Inpatient Hospital Stay: Payer: BC Managed Care – PPO | Attending: Oncology | Admitting: Oncology

## 2020-04-10 VITALS — BP 95/53 | HR 68 | Resp 18

## 2020-04-10 VITALS — BP 97/62 | HR 88 | Temp 97.6°F | Resp 16 | Wt 138.4 lb

## 2020-04-10 DIAGNOSIS — Z833 Family history of diabetes mellitus: Secondary | ICD-10-CM | POA: Diagnosis not present

## 2020-04-10 DIAGNOSIS — D508 Other iron deficiency anemias: Secondary | ICD-10-CM

## 2020-04-10 DIAGNOSIS — Z79899 Other long term (current) drug therapy: Secondary | ICD-10-CM | POA: Insufficient documentation

## 2020-04-10 DIAGNOSIS — D509 Iron deficiency anemia, unspecified: Secondary | ICD-10-CM

## 2020-04-10 DIAGNOSIS — R823 Hemoglobinuria: Secondary | ICD-10-CM

## 2020-04-10 DIAGNOSIS — E785 Hyperlipidemia, unspecified: Secondary | ICD-10-CM | POA: Diagnosis not present

## 2020-04-10 DIAGNOSIS — Z803 Family history of malignant neoplasm of breast: Secondary | ICD-10-CM | POA: Insufficient documentation

## 2020-04-10 DIAGNOSIS — K219 Gastro-esophageal reflux disease without esophagitis: Secondary | ICD-10-CM | POA: Diagnosis not present

## 2020-04-10 DIAGNOSIS — Z823 Family history of stroke: Secondary | ICD-10-CM | POA: Insufficient documentation

## 2020-04-10 MED ORDER — SODIUM CHLORIDE 0.9 % IV SOLN
Freq: Once | INTRAVENOUS | Status: AC
  Filled 2020-04-10: qty 250

## 2020-04-10 MED ORDER — IRON SUCROSE 20 MG/ML IV SOLN
200.0000 mg | Freq: Once | INTRAVENOUS | Status: AC
  Administered 2020-04-10: 200 mg via INTRAVENOUS
  Filled 2020-04-10: qty 10

## 2020-04-10 MED ORDER — SODIUM CHLORIDE 0.9 % IV SOLN: 200.0000 mg | Freq: Once | INTRAVENOUS | Status: DC

## 2020-04-10 NOTE — Progress Notes (Signed)
Pt tolerated Venofer infusion well with no signs of complications or reaction. RN educated pt on the importance of calling the clinic if any complications occur at home. Pt verbalized understanding. VSS. Pt stable for discharge.   Paige Robertson [employer]

## 2020-04-10 NOTE — Progress Notes (Signed)
Hematology/Oncology Consult note [facility] Telephone:([phone] Fax:([phone]   Patient Care Team: Benjaman Kindler, MD as PCP - General (Obstetrics and Gynecology)  REFERRING PROVIDER: Benjaman Kindler, MD CHIEF COMPLAINTS/REASON FOR VISIT:  Follow-up for iron deficiency anemia  HISTORY OF PRESENTING ILLNESS:  Paige Robertson is a  44 y.o.  female with PMH listed below was seen in consultation at the request of Benjaman Kindler, MD   for evaluation of iron deficiency anemia.   Reviewed patient's recent labs  Patient presented for well woman visit to Dr. Migdalia Dk office at that time she has noticed fatigue, shortness of breath. Reports that her menstrual period has been regular, very light. 01/05/2020 labs revealed anemia with hemoglobin of 4.7, status post 2 units of PRBC transfusion. 01/11/2020 hemoglobin increased to 8.5, iron 19, TIBC 543, transferrin 388, ferritin 5. Erythropoietin level elevated at 29.5. Direct Coombs testing negative.  LDH normal.  Haptoglobin within normal limits 01/18/2020 hemoglobin 7.4, hematocrit 26.7, Celiac panel negative. 01/25/2020, hemoglobin 8.3, hematocrit 29.5, iron 22, TIBC 501, transferrin 357, saturation 94. Patient is currently taking ferrous sulfate 325 mg daily.  She reports that iron upset her stomach.  She has now take it every other day Patient denies any epigastric pain.  Patient underwent EGD and colonoscopy-reports are not available to me via care everywhere.  Per patient, no bleeding source was found.  INTERVAL HISTORY Paige Robertson is a 44 y.o. female who has above history reviewed by me today presents for follow up visit for management of iron deficiency anemia Problems and complaints are listed below: Patient preferred to defer IV iron treatments and to try oral iron supplementation.  She is taking oral iron supplementation every other day initially increased to daily.  She reports epigastric discomfort  while taking oral iron supplementation. She had her last menstrual period 2 weeks ago.  Flow was normal. Denies any bloody or black stool. She was accompanied by her husband. She reports more fatigue today.  Feeling cold.  Review of Systems  Constitutional: Positive for fatigue. Negative for appetite change, chills and fever.  HENT:   Negative for hearing loss and voice change.   Eyes: Negative for eye problems.  Respiratory: Negative for chest tightness and cough.   Cardiovascular: Negative for chest pain.  Gastrointestinal: Negative for abdominal distention, abdominal pain and blood in stool.  Endocrine: Negative for hot flashes.  Genitourinary: Negative for difficulty urinating and frequency.   Musculoskeletal: Negative for arthralgias.  Skin: Negative for itching and rash.  Neurological: Negative for extremity weakness.  Hematological: Negative for adenopathy.  Psychiatric/Behavioral: Negative for confusion.    MEDICAL HISTORY:  Past Medical History:  Diagnosis Date  . Acid reflux   . Hematuria   . Hyperlipemia   . Perimenopausal symptoms   . Vaginal discharge     SURGICAL HISTORY: Past Surgical History:  Procedure Laterality Date  . NO PAST SURGERIES      SOCIAL HISTORY: Social History   Socioeconomic History  . Marital status: Married    Spouse name: Not on file  . Number of children: Not on file  . Years of education: Not on file  . Highest education level: Not on file  Occupational History  . Not on file  Tobacco Use  . Smoking status: Never Smoker  . Smokeless tobacco: Never Used  Substance and Sexual Activity  . Alcohol use: No  . Drug use: No  . Sexual activity: Yes    Comment: vasectomy  Other Topics Concern  .  Not on file  Social History Narrative  . Not on file   Social Determinants of Health   Financial Resource Strain:   . Difficulty of Paying Living Expenses:   Food Insecurity:   . Worried About Charity fundraiser in the Last Year:    . Arboriculturist in the Last Year:   Transportation Needs:   . Film/video editor (Medical):   Marland Kitchen Lack of Transportation (Non-Medical):   Physical Activity:   . Days of Exercise per Week:   . Minutes of Exercise per Session:   Stress:   . Feeling of Stress :   Social Connections:   . Frequency of Communication with Friends and Family:   . Frequency of Social Gatherings with Friends and Family:   . Attends Religious Services:   . Active Member of Clubs or Organizations:   . Attends Archivist Meetings:   Marland Kitchen Marital Status:   Intimate Partner Violence:   . Fear of Current or Ex-Partner:   . Emotionally Abused:   Marland Kitchen Physically Abused:   . Sexually Abused:     FAMILY HISTORY: Family History  Problem Relation Age of Onset  . Diabetes Father   . Stroke Father   . Breast cancer Maternal Grandmother   . Breast cancer Paternal Aunt   . High Cholesterol Mother   . Colon cancer Neg Hx   . Ovarian cancer Neg Hx   . Heart disease Neg Hx     ALLERGIES:  is allergic to sulfa antibiotics.  MEDICATIONS:  Current Outpatient Medications  Medication Sig Dispense Refill  . cetirizine (ZYRTEC) 10 MG tablet Take 10 mg by mouth daily.    . ferrous sulfate 325 (65 FE) MG tablet Take 325 mg by mouth every other day. QD for 7 days    . Multiple Vitamins-Calcium (ONE-A-DAY WOMENS FORMULA) TABS Take 1 tablet by mouth daily.    . fluconazole (DIFLUCAN) 150 MG tablet Take 1 tablet (150 mg total) by mouth daily. (Patient not taking: Reported on 04/10/2020) 1 tablet 0   No current facility-administered medications for this visit.     PHYSICAL EXAMINATION: ECOG PERFORMANCE STATUS: 0 - Asymptomatic Vitals:   04/10/20 1259  BP: 97/62  Pulse: 88  Resp: 16  Temp: 97.6 F (36.4 C)   Filed Weights   04/10/20 1259  Weight: 138 lb 6.4 oz (62.8 kg)    Physical Exam Constitutional:      General: She is not in acute distress. HENT:     Head: Normocephalic and atraumatic.  Eyes:      General: No scleral icterus. Cardiovascular:     Rate and Rhythm: Normal rate and regular rhythm.     Heart sounds: Normal heart sounds.  Pulmonary:     Effort: Pulmonary effort is normal. No respiratory distress.     Breath sounds: No wheezing.  Abdominal:     General: Bowel sounds are normal. There is no distension.     Palpations: Abdomen is soft.  Musculoskeletal:        General: No deformity. Normal range of motion.     Cervical back: Normal range of motion and neck supple.  Skin:    General: Skin is warm and dry.     Findings: No erythema or rash.     Comments: Bilateral axillary skin hypopigmentation  Neurological:     Mental Status: She is alert and oriented to person, place, and time. Mental status is at baseline.  Cranial Nerves: No cranial nerve deficit.     Coordination: Coordination normal.  Psychiatric:        Mood and Affect: Mood normal.       CMP Latest Ref Rng & Units 10/01/2017  Glucose 65 - 99 mg/dL 100(H)  BUN 6 - 24 mg/dL 10  Creatinine 0.57 - 1.00 mg/dL 0.75  Sodium 134 - 144 mmol/L 141  Potassium 3.5 - 5.2 mmol/L 4.4  Chloride 96 - 106 mmol/L 101  CO2 20 - 29 mmol/L 26  Calcium 8.7 - 10.2 mg/dL 9.5  Total Protein 6.0 - 8.5 g/dL 7.0  Total Bilirubin 0.0 - 1.2 mg/dL 0.7  Alkaline Phos 39 - 117 IU/L 59  AST 0 - 40 IU/L 17  ALT 0 - 32 IU/L 22   CBC Latest Ref Rng & Units 02/24/2020  WBC 4.0 - 10.5 K/uL 6.2  Hemoglobin 12.0 - 15.0 g/dL 11.4(L)  Hematocrit 36.0 - 46.0 % 37.3  Platelets 150 - 400 K/uL 232     LABORATORY DATA:  I have reviewed the data as listed Lab Results  Component Value Date   WBC 6.2 02/24/2020   HGB 11.4 (L) 02/24/2020   HCT 37.3 02/24/2020   MCV 84.8 02/24/2020   PLT 232 02/24/2020   No results for input(s): NA, K, CL, CO2, GLUCOSE, BUN, CREATININE, CALCIUM, GFRNONAA, GFRAA, PROT, ALBUMIN, AST, ALT, ALKPHOS, BILITOT, BILIDIR, IBILI in the last 8760 hours. Iron/TIBC/Ferritin/ %Sat    Component Value Date/Time    IRON 27 (L) 02/24/2020 1158   TIBC 482 (H) 02/24/2020 1158   FERRITIN 7 (L) 02/24/2020 1158   IRONPCTSAT 6 (L) 02/24/2020 1158     RADIOGRAPHIC STUDIES: I have personally reviewed the radiological images as listed and agreed with the findings in the report. No results found.     ASSESSMENT & PLAN:  1. Other iron deficiency anemia    #Iron deficiency anemia. Patient had blood work done at [facility] on 04/04/2020 Labs reviewed and in details about with patient. Hemoglobin 10.1, hematocrit 32.9, iron panel showed iron 17, ferritin 11, TIBC 453, transferrin 323, iron saturation 4. This is consistent with iron deficiency anemia, despite oral iron supplementation. In addition she does not tolerate oral iron supplementation due to GI side effects. Patient now is agreeable for IV iron treatments. I have discussed with her in details about the rationale and potential side effects of IV iron treatments. She is willing to proceed with IV Venofer treatment today. I recommend additional IV Venofer weekly x2. Repeat blood work in 6 weeks for assessment of treatment response.  Etiology of iron deficiency anemia Patient has had EGD and colonoscopy done which did not reveal any active bleeding source.  She has not had a small bowel capsule study done yet.  Celiac panel is negative.  UA showed small hemoglobin, no RBC patient denies any urinary symptoms Differential includes malabsorption versus chronic blood loss. I recommend to further improve her iron panel, after that observation.  If her iron level decreases, then chronic blood loss is more likely than malabsorption. She agrees with the plan.  UA showed large amount of leukocytes, no RBC, small hemoglobin urine dipstic- Patient has no urinary symptoms.  For hemoglobinuria,  LDH was normal.  Coombs testing negative. I will repeat a UA in 6 weeks, check PNH, rhabdomyolysis work-up.   Follow up in 3 months. Orders Placed This Encounter   Procedures  . CBC with Differential/Platelet    Standing Status:   Future  Standing Expiration Date:   04/10/2021  . Ferritin    Standing Status:   Future    Standing Expiration Date:   04/10/2021  . Iron and TIBC    Standing Status:   Future    Standing Expiration Date:   04/10/2021    All questions were answered. The patient knows to call the clinic with any problems questions or concerns.  Cc Benjaman Kindler, MD  Thank you for this kind referral and the opportunity to participate in the care of this patient. A copy of today's note is routed to referring provider   Earlie Server, MD, PhD Hematology Oncology [place] [facility] 04/10/2020

## 2020-04-10 NOTE — Progress Notes (Signed)
Patient has an increase in her fatigue.

## 2020-04-11 ENCOUNTER — Inpatient Hospital Stay: Payer: BC Managed Care – PPO

## 2020-04-13 ENCOUNTER — Inpatient Hospital Stay: Payer: BC Managed Care – PPO | Admitting: Oncology

## 2020-04-18 ENCOUNTER — Other Ambulatory Visit: Payer: Self-pay

## 2020-04-18 ENCOUNTER — Inpatient Hospital Stay: Payer: BC Managed Care – PPO

## 2020-04-18 VITALS — BP 97/72 | HR 79 | Temp 97.7°F | Resp 16

## 2020-04-18 DIAGNOSIS — D509 Iron deficiency anemia, unspecified: Secondary | ICD-10-CM

## 2020-04-18 DIAGNOSIS — D508 Other iron deficiency anemias: Secondary | ICD-10-CM | POA: Diagnosis not present

## 2020-04-18 MED ORDER — SODIUM CHLORIDE 0.9 % IV SOLN
Freq: Once | INTRAVENOUS | Status: AC
  Filled 2020-04-18: qty 250

## 2020-04-18 MED ORDER — IRON SUCROSE 20 MG/ML IV SOLN
200.0000 mg | Freq: Once | INTRAVENOUS | Status: AC
  Administered 2020-04-18: 200 mg via INTRAVENOUS
  Filled 2020-04-18: qty 10

## 2020-04-25 ENCOUNTER — Inpatient Hospital Stay: Payer: BC Managed Care – PPO

## 2020-04-25 ENCOUNTER — Other Ambulatory Visit: Payer: Self-pay

## 2020-04-25 VITALS — BP 90/59 | HR 69 | Temp 98.6°F | Resp 16

## 2020-04-25 DIAGNOSIS — D509 Iron deficiency anemia, unspecified: Secondary | ICD-10-CM

## 2020-04-25 DIAGNOSIS — D508 Other iron deficiency anemias: Secondary | ICD-10-CM | POA: Diagnosis not present

## 2020-04-25 MED ORDER — IRON SUCROSE 20 MG/ML IV SOLN
200.0000 mg | Freq: Once | INTRAVENOUS | Status: AC
  Administered 2020-04-25: 200 mg via INTRAVENOUS
  Filled 2020-04-25: qty 10

## 2020-04-25 MED ORDER — SODIUM CHLORIDE 0.9 % IV SOLN
Freq: Once | INTRAVENOUS | Status: AC
  Filled 2020-04-25: qty 250

## 2020-04-25 NOTE — Progress Notes (Signed)
Pt tolerated infusion well. Pt and VS stable at discharge.  

## 2020-05-23 ENCOUNTER — Other Ambulatory Visit: Payer: Self-pay

## 2020-05-23 ENCOUNTER — Inpatient Hospital Stay: Payer: BC Managed Care – PPO | Attending: Oncology

## 2020-05-23 DIAGNOSIS — D508 Other iron deficiency anemias: Secondary | ICD-10-CM | POA: Diagnosis not present

## 2020-05-23 DIAGNOSIS — R823 Hemoglobinuria: Secondary | ICD-10-CM

## 2020-05-23 DIAGNOSIS — R0602 Shortness of breath: Secondary | ICD-10-CM

## 2020-05-23 LAB — COMPREHENSIVE METABOLIC PANEL
ALT: 27 U/L (ref 0–44)
AST: 25 U/L (ref 15–41)
Albumin: 4.5 g/dL (ref 3.5–5.0)
Alkaline Phosphatase: 76 U/L (ref 38–126)
Anion gap: 9 (ref 5–15)
BUN: 14 mg/dL (ref 6–20)
CO2: 29 mmol/L (ref 22–32)
Calcium: 9.7 mg/dL (ref 8.9–10.3)
Chloride: 102 mmol/L (ref 98–111)
Creatinine, Ser: 0.75 mg/dL (ref 0.44–1.00)
GFR calc Af Amer: >60 mL/min (ref >60)
GFR calc non Af Amer: >60 mL/min (ref >60)
Glucose, Bld: 101 mg/dL — ABNORMAL HIGH (ref 70–99)
Potassium: 4.3 mmol/L (ref 3.5–5.1)
Sodium: 140 mmol/L (ref 135–145)
Total Bilirubin: 0.4 mg/dL (ref 0.3–1.2)
Total Protein: 7.7 g/dL (ref 6.5–8.1)

## 2020-05-23 LAB — CBC WITH DIFFERENTIAL/PLATELET
Abs Immature Granulocytes: 0.02 10*3/uL (ref 0.00–0.07)
Basophils Absolute: 0.1 10*3/uL (ref 0.0–0.1)
Basophils Relative: 1 %
Eosinophils Absolute: 0.2 10*3/uL (ref 0.0–0.5)
Eosinophils Relative: 3 %
HCT: 36.8 % (ref 36.0–46.0)
Hemoglobin: 12.2 g/dL (ref 12.0–15.0)
Immature Granulocytes: 0 %
Lymphocytes Relative: 31 %
Lymphs Abs: 2 10*3/uL (ref 0.7–4.0)
MCH: 29.8 pg (ref 26.0–34.0)
MCHC: 33.2 g/dL (ref 30.0–36.0)
MCV: 89.8 fL (ref 80.0–100.0)
Monocytes Absolute: 0.4 10*3/uL (ref 0.1–1.0)
Monocytes Relative: 6 %
Neutro Abs: 3.7 10*3/uL (ref 1.7–7.7)
Neutrophils Relative %: 59 %
Platelets: 218 10*3/uL (ref 150–400)
RBC: 4.1 MIL/uL (ref 3.87–5.11)
RDW: 16.8 % — ABNORMAL HIGH (ref 11.5–15.5)
WBC: 6.4 10*3/uL (ref 4.0–10.5)
nRBC: 0 % (ref 0.0–0.2)

## 2020-05-23 LAB — URINALYSIS, COMPLETE (UACMP) WITH MICROSCOPIC
Bilirubin Urine: NEGATIVE
Glucose, UA: NEGATIVE mg/dL
Ketones, ur: NEGATIVE mg/dL
Leukocytes,Ua: NEGATIVE
Nitrite: NEGATIVE
Protein, ur: NEGATIVE mg/dL
Specific Gravity, Urine: 1.002 — ABNORMAL LOW (ref 1.005–1.030)
pH: 7 (ref 5.0–8.0)

## 2020-05-23 LAB — FERRITIN: Ferritin: 54 ng/mL (ref 11–307)

## 2020-05-23 LAB — IRON AND TIBC
Iron: 59 ug/dL (ref 28–170)
Saturation Ratios: 17 % (ref 10.4–31.8)
TIBC: 343 ug/dL (ref 250–450)
UIBC: 284 ug/dL

## 2020-05-25 LAB — HAPTOGLOBIN: Haptoglobin: 139 mg/dL (ref 42–296)

## 2020-05-28 ENCOUNTER — Telehealth: Payer: Self-pay

## 2020-05-28 LAB — PNH PROFILE (-HIGH SENSITIVITY)

## 2020-05-28 NOTE — Telephone Encounter (Signed)
-----   Message from Earlie Server, MD sent at 05/28/2020  3:19 PM EDT ----- Please let patient know that her iron labs and CBC have improved.  No need for additional IV Venofer treatments. There is one test still pending. Please arrange for her to follow-up in 3 months.  Labs prior iron, TIBC ferritin, CBC.  And then MD +/- Venofer.

## 2020-05-28 NOTE — Telephone Encounter (Signed)
Done. Pt is aware of her scheduled appts.

## 2020-05-28 NOTE — Telephone Encounter (Signed)
Patient notified of results. Ellison Hughs, please schedule 3 month follow up as requested and notify pt about appt details. Thanks

## 2020-07-06 ENCOUNTER — Inpatient Hospital Stay
Admission: EM | Admit: 2020-07-06 | Discharge: 2020-07-09 | DRG: 378 | Disposition: A | Discharge status: Home / Self Care | Payer: BC Managed Care – PPO | Attending: Hospitalist | Admitting: Hospitalist

## 2020-07-06 ENCOUNTER — Telehealth: Payer: Self-pay | Admitting: *Deleted

## 2020-07-06 ENCOUNTER — Inpatient Hospital Stay: Payer: BC Managed Care – PPO | Admitting: Anesthesiology

## 2020-07-06 ENCOUNTER — Encounter: Payer: Self-pay | Admitting: Emergency Medicine

## 2020-07-06 ENCOUNTER — Other Ambulatory Visit: Payer: Self-pay

## 2020-07-06 ENCOUNTER — Encounter
Admission: EM | Disposition: A | Discharge status: Home / Self Care | Payer: Self-pay | Source: Home / Self Care | Attending: Hospitalist

## 2020-07-06 ENCOUNTER — Encounter: Payer: Self-pay | Admitting: Oncology

## 2020-07-06 DIAGNOSIS — K922 Gastrointestinal hemorrhage, unspecified: Secondary | ICD-10-CM

## 2020-07-06 DIAGNOSIS — K31811 Angiodysplasia of stomach and duodenum with bleeding: Principal | ICD-10-CM | POA: Diagnosis present

## 2020-07-06 DIAGNOSIS — E785 Hyperlipidemia, unspecified: Secondary | ICD-10-CM | POA: Diagnosis present

## 2020-07-06 DIAGNOSIS — Z79899 Other long term (current) drug therapy: Secondary | ICD-10-CM

## 2020-07-06 DIAGNOSIS — Z882 Allergy status to sulfonamides status: Secondary | ICD-10-CM

## 2020-07-06 DIAGNOSIS — D62 Acute posthemorrhagic anemia: Secondary | ICD-10-CM | POA: Diagnosis present

## 2020-07-06 DIAGNOSIS — D500 Iron deficiency anemia secondary to blood loss (chronic): Secondary | ICD-10-CM | POA: Diagnosis not present

## 2020-07-06 DIAGNOSIS — R55 Syncope and collapse: Secondary | ICD-10-CM | POA: Diagnosis present

## 2020-07-06 DIAGNOSIS — K31819 Angiodysplasia of stomach and duodenum without bleeding: Secondary | ICD-10-CM

## 2020-07-06 DIAGNOSIS — D509 Iron deficiency anemia, unspecified: Secondary | ICD-10-CM | POA: Diagnosis present

## 2020-07-06 DIAGNOSIS — K921 Melena: Secondary | ICD-10-CM | POA: Diagnosis not present

## 2020-07-06 DIAGNOSIS — K219 Gastro-esophageal reflux disease without esophagitis: Secondary | ICD-10-CM | POA: Diagnosis present

## 2020-07-06 DIAGNOSIS — Z20822 Contact with and (suspected) exposure to covid-19: Secondary | ICD-10-CM | POA: Diagnosis present

## 2020-07-06 HISTORY — DX: Melena: K92.1

## 2020-07-06 HISTORY — PX: ESOPHAGOGASTRODUODENOSCOPY (EGD) WITH PROPOFOL: SHX5813

## 2020-07-06 HISTORY — DX: Iron deficiency anemia, unspecified: D50.9

## 2020-07-06 LAB — CBC
HCT: 31.2 % — ABNORMAL LOW (ref 36.0–46.0)
Hemoglobin: 10.6 g/dL — ABNORMAL LOW (ref 12.0–15.0)
MCH: 30.5 pg (ref 26.0–34.0)
MCHC: 34 g/dL (ref 30.0–36.0)
MCV: 89.7 fL (ref 80.0–100.0)
Platelets: 272 10*3/uL (ref 150–400)
RBC: 3.48 MIL/uL — ABNORMAL LOW (ref 3.87–5.11)
RDW: 15.1 % (ref 11.5–15.5)
WBC: 12.1 10*3/uL — ABNORMAL HIGH (ref 4.0–10.5)
nRBC: 0 % (ref 0.0–0.2)

## 2020-07-06 LAB — COMPREHENSIVE METABOLIC PANEL
ALT: 28 U/L (ref 0–44)
AST: 26 U/L (ref 15–41)
Albumin: 4.4 g/dL (ref 3.5–5.0)
Alkaline Phosphatase: 63 U/L (ref 38–126)
Anion gap: 9 (ref 5–15)
BUN: 33 mg/dL — ABNORMAL HIGH (ref 6–20)
CO2: 23 mmol/L (ref 22–32)
Calcium: 9.5 mg/dL (ref 8.9–10.3)
Chloride: 105 mmol/L (ref 98–111)
Creatinine, Ser: 0.57 mg/dL (ref 0.44–1.00)
GFR calc Af Amer: >60 mL/min (ref >60)
GFR calc non Af Amer: >60 mL/min (ref >60)
Glucose, Bld: 144 mg/dL — ABNORMAL HIGH (ref 70–99)
Potassium: 4.4 mmol/L (ref 3.5–5.1)
Sodium: 137 mmol/L (ref 135–145)
Total Bilirubin: 0.5 mg/dL (ref 0.3–1.2)
Total Protein: 7.3 g/dL (ref 6.5–8.1)

## 2020-07-06 LAB — TSH: TSH: 3.531 u[IU]/mL (ref 0.350–4.500)

## 2020-07-06 LAB — PROTIME-INR
INR: 0.8 (ref 0.8–1.2)
Prothrombin Time: 11.1 seconds — ABNORMAL LOW (ref 11.4–15.2)

## 2020-07-06 LAB — POCT PREGNANCY, URINE: Preg Test, Ur: NEGATIVE

## 2020-07-06 LAB — T4, FREE: Free T4: 0.71 ng/dL (ref 0.61–1.12)

## 2020-07-06 LAB — SARS CORONAVIRUS 2 BY RT PCR (HOSPITAL ORDER, PERFORMED IN ~~LOC~~ HOSPITAL LAB): SARS Coronavirus 2: NEGATIVE

## 2020-07-06 LAB — APTT: aPTT: 26 seconds (ref 24–36)

## 2020-07-06 SURGERY — ESOPHAGOGASTRODUODENOSCOPY (EGD) WITH PROPOFOL
Anesthesia: General

## 2020-07-06 MED ORDER — LIDOCAINE HCL (CARDIAC) PF 100 MG/5ML IV SOSY
PREFILLED_SYRINGE | INTRAVENOUS | Status: DC | PRN
  Administered 2020-07-06 (×2): 50 mg via INTRAVENOUS

## 2020-07-06 MED ORDER — PANTOPRAZOLE SODIUM 40 MG IV SOLR
40.0000 mg | Freq: Two times a day (BID) | INTRAVENOUS | Status: DC
  Administered 2020-07-06 – 2020-07-09 (×6): 40 mg via INTRAVENOUS
  Filled 2020-07-06 (×6): qty 40

## 2020-07-06 MED ORDER — SODIUM CHLORIDE 0.9 % IV SOLN: INTRAVENOUS | Status: DC

## 2020-07-06 MED ORDER — PROPOFOL 10 MG/ML IV BOLUS
INTRAVENOUS | Status: DC | PRN
  Administered 2020-07-06 (×2): 50 mg via INTRAVENOUS

## 2020-07-06 MED ORDER — PROPOFOL 500 MG/50ML IV EMUL
INTRAVENOUS | Status: AC
  Filled 2020-07-06: qty 50

## 2020-07-06 MED ORDER — PANTOPRAZOLE SODIUM 40 MG IV SOLR
40.0000 mg | Freq: Once | INTRAVENOUS | Status: AC
  Administered 2020-07-06: 40 mg via INTRAVENOUS
  Filled 2020-07-06: qty 40

## 2020-07-06 MED ORDER — SODIUM CHLORIDE 0.9 % IV SOLN
1000.0000 mL | Freq: Once | INTRAVENOUS | Status: AC
  Administered 2020-07-06: 1000 mL via INTRAVENOUS

## 2020-07-06 MED ORDER — PROPOFOL 500 MG/50ML IV EMUL
INTRAVENOUS | Status: DC | PRN
  Administered 2020-07-06: 125 ug/kg/min via INTRAVENOUS

## 2020-07-06 NOTE — Telephone Encounter (Signed)
Patient called reporting that she "Blacked out" last night and that she is having dark stools. Asking if she should come in to be seen. Please advise

## 2020-07-06 NOTE — Transfer of Care (Signed)
Immediate Anesthesia Transfer of Care Note  Patient: Paige Robertson  Procedure(s) Performed: ESOPHAGOGASTRODUODENOSCOPY (EGD) WITH PROPOFOL (N/A )  Patient Location: PACU and Endoscopy Unit  Anesthesia Type:General  Level of Consciousness: drowsy  Airway & Oxygen Therapy: Patient Spontanous Breathing  Post-op Assessment: Report given to RN and Post -op Vital signs reviewed and stable  Post vital signs: Reviewed and stable  Last Vitals:  Vitals Value Taken Time  BP 105/74 07/06/20 1646  Temp    Pulse 102 07/06/20 1646  Resp 21 07/06/20 1646  SpO2 100 % 07/06/20 1646  Vitals shown include unvalidated device data.  Last Pain:  Vitals:   07/06/20 1621  TempSrc: Temporal  PainSc: 0-No pain         Complications: No complications documented.

## 2020-07-06 NOTE — Anesthesia Preprocedure Evaluation (Addendum)
Anesthesia Evaluation  Patient identified by MRN, date of birth, ID band Patient awake    Reviewed: Allergy & Precautions, H&P , NPO status , Patient's Chart, lab work & pertinent test results  History of Anesthesia Complications Negative for: history of anesthetic complications  Airway Mallampati: II  TM Distance: >3 FB     Dental  (+) Teeth Intact   Pulmonary neg pulmonary ROS, neg sleep apnea, neg COPD,    breath sounds clear to auscultation       Cardiovascular (-) angina(-) Past MI and (-) Cardiac Stents negative cardio ROS  (-) dysrhythmias  Rhythm:regular Rate:Normal     Neuro/Psych negative neurological ROS  negative psych ROS   GI/Hepatic Neg liver ROS, GERD  ,  Endo/Other  negative endocrine ROS  Renal/GU negative Renal ROS  negative genitourinary   Musculoskeletal   Abdominal   Peds  Hematology  (+) Blood dyscrasia, anemia ,   Anesthesia Other Findings Past Medical History: No date: Acid reflux No date: Hematuria No date: Hyperlipemia No date: Perimenopausal symptoms No date: Vaginal discharge  Past Surgical History: No date: NO PAST SURGERIES  BMI    Body Mass Index: 24.69 kg/m      Reproductive/Obstetrics negative OB ROS                            Anesthesia Physical Anesthesia Plan  ASA: II  Anesthesia Plan: General   Post-op Pain Management:    Induction:   PONV Risk Score and Plan: Propofol infusion and TIVA  Airway Management Planned: Nasal Cannula  Additional Equipment:   Intra-op Plan:   Post-operative Plan:   Informed Consent: I have reviewed the patients History and Physical, chart, labs and discussed the procedure including the risks, benefits and alternatives for the proposed anesthesia with the patient or authorized representative who has indicated his/her understanding and acceptance.     Dental Advisory Given  Plan Discussed with:  Anesthesiologist, CRNA and Surgeon  Anesthesia Plan Comments:         Anesthesia Quick Evaluation

## 2020-07-06 NOTE — Anesthesia Procedure Notes (Signed)
Date/Time: 07/06/2020 4:26 PM Performed by: Jerrye Noble, CRNA Pre-anesthesia Checklist: Patient identified, Emergency Drugs available, Patient being monitored and Suction available Patient Re-evaluated:Patient Re-evaluated prior to induction Oxygen Delivery Method: Nasal cannula

## 2020-07-06 NOTE — H&P (Signed)
History and Physical    Paige Robertson JME:[id number] DOB: Jul 11, 1976 DOA: 07/06/2020   PCP: Benjaman Kindler, MD   Patient coming from: Home  Chief Complaint: GIB.  HPI: Paige Robertson is a 44 y.o. female with medical history significant of  Anemia and GIB seen in ed for black stools. Pt states in march of this year pt had anemia found on labs at pcp and  Had egd and c-scopy and was told studies showed it was negative. Pt generally had taken ibuprofen and takes it for her periods. Has not taken it since march of this year.   This time pt has passed out and was light headed last night about 7 pm while running and errand. Pt went out and passed out in her car. Daughter drove her home yesterday. Pt could not go to work because she was feeling weak.  ED Course:  Blood pressure 107/85, pulse 95, temperature 98.6 F (37 C), temperature source Oral, resp. rate 15, height 5\' 2"  (1.575 m), weight 61.2 kg, last menstrual period 06/13/2020, SpO2 100 %. Pt was given 1L NS and iv ppi with Protonix 40 mg iv.  Seen by gi Dr. Allen Norris in ED today and plan is for Scope today.  ROS is otherwise negative pt feels weak but otherwise is ok.  Review of Systems: As per HPI otherwise 10 point review of systems negative.    Past Medical History:  Diagnosis Date  . Acid reflux   . Hematuria   . Hyperlipemia   . Perimenopausal symptoms   . Vaginal discharge     Past Surgical History:  Procedure Laterality Date  . NO PAST SURGERIES       reports that she has never smoked. She has never used smokeless tobacco. She reports that she does not drink alcohol and does not use drugs.  Allergies  Allergen Reactions  . Sulfa Antibiotics     Family History  Problem Relation Age of Onset  . Diabetes Father   . Stroke Father   . Breast cancer Maternal Grandmother   . Breast cancer Paternal Aunt   . High Cholesterol Mother   . Colon cancer Neg Hx   . Ovarian cancer Neg Hx   . Heart disease Neg Hx      Prior to Admission medications   Medication Sig Start Date End Date Taking? Authorizing Provider  cetirizine (ZYRTEC) 10 MG tablet Take 10 mg by mouth daily.    [provider]  ferrous sulfate 325 (65 FE) MG tablet Take 325 mg by mouth every other day. QD for 7 days    [provider]  fluconazole (DIFLUCAN) 150 MG tablet Take 1 tablet (150 mg total) by mouth daily. Patient not taking: Reported on 04/10/2020 09/13/18   Defrancesco, Alanda Slim, MD  Multiple Vitamins-Calcium (ONE-A-DAY WOMENS FORMULA) TABS Take 1 tablet by mouth daily. 01/31/20   [provider]    Physical Exam: Vitals:   07/06/20 1231 07/06/20 1300 07/06/20 1330 07/06/20 1500  BP: (!) 140/127 107/81 94/67 107/85  Pulse:  94 89 95  Resp:  16 17 15   Temp:      TempSrc:      SpO2:  100% 100% 100%  Weight:      Height:        Constitutional: NAD, calm, comfortable Vitals:   07/06/20 1231 07/06/20 1300 07/06/20 1330 07/06/20 1500  BP: (!) 140/127 107/81 94/67 107/85  Pulse:  94 89 95  Resp:  16 17 15  Temp:      TempSrc:      SpO2:  100% 100% 100%  Weight:      Height:       Eyes: PERRL, lids and conjunctivae normal ENMT: Mucous membranes are moist. Posterior pharynx clear of any exudate or lesions.Normal dentition.  Neck: normal, supple, no masses, no thyromegaly Respiratory: clear to auscultation bilaterally, no wheezing, no crackles. Normal respiratory effort. No accessory muscle use.  Cardiovascular: Regular rate and rhythm, no murmurs / rubs / gallops. No extremity edema. 2+ pedal pulses. No carotid bruits.  Abdomen: no tenderness, no masses palpated. No hepatosplenomegaly. Bowel sounds positive.  Musculoskeletal: no clubbing / cyanosis. No joint deformity upper and lower extremities. Good ROM, no contractures. Normal muscle tone.  Skin: no rashes, lesions, ulcers. No induration Neurologic: CN 2-12 grossly intact.Moving all ext and no weakness.  Psychiatric: Normal judgment and  insight. Alert and oriented x 3. Normal mood.   Labs on Admission: I have personally reviewed following labs and imaging studies  CBC: Recent Labs  Lab 07/06/20 1212  WBC 12.1*  HGB 10.6*  HCT 31.2*  MCV 89.7  PLT 048   Basic Metabolic Panel: Recent Labs  Lab 07/06/20 1212  NA 137  K 4.4  CL 105  CO2 23  GLUCOSE 144*  BUN 33*  CREATININE 0.57  CALCIUM 9.5   GFR: Estimated Creatinine Clearance: 78 mL/min (by C-G formula based on SCr of 0.57 mg/dL). Liver Function Tests: Recent Labs  Lab 07/06/20 1212  AST 26  ALT 28  ALKPHOS 63  BILITOT 0.5  PROT 7.3  ALBUMIN 4.4   No results for input(s): LIPASE, AMYLASE in the last 168 hours. No results for input(s): AMMONIA in the last 168 hours. Coagulation Profile: Recent Labs  Lab 07/06/20 1212  INR 0.8   Urine analysis:    Component Value Date/Time   COLORURINE COLORLESS (A) 05/23/2020 1351   APPEARANCEUR CLEAR (A) 05/23/2020 1351   LABSPEC 1.002 (L) 05/23/2020 1351   PHURINE 7.0 05/23/2020 1351   GLUCOSEU NEGATIVE 05/23/2020 1351   HGBUR MODERATE (A) 05/23/2020 1351   BILIRUBINUR NEGATIVE 05/23/2020 1351   KETONESUR NEGATIVE 05/23/2020 1351   PROTEINUR NEGATIVE 05/23/2020 1351   NITRITE NEGATIVE 05/23/2020 1351   LEUKOCYTESUR NEGATIVE 05/23/2020 1351    Assessment/Plan Principal Problem:   GI bleed/Melena: -IV access x 2.  -Type/screen/tranfuse PRN. -IV ppi/ npo. -PPI therapy upon discharge indefinitely.  -d/d include gastritis/ pud/ AVM.    Iron deficiency anemia: Acute blood loss anemia / Iron def anemia: -iron studies and thyroid function test.  -will consider iron infusion.      Acid reflux - iv ppi.   DVT prophylaxis: SCD'd Code Status: Full Code Family Communication: twala collings - spouse- [phone]. Disposition Plan: Home Consults called: GI -Dr.Wohl  Called [place] ED[facility] Admission status: Inpatient.  Para Skeans MD [facility] Pager [phone] If 7PM-7AM, please  contact night-coverage [email or web address] Password [email or web address] 07/06/2020, 3:21 PM

## 2020-07-06 NOTE — ED Triage Notes (Signed)
Here for syncope.  Pt membranes appear very pale.  Tachy in triage.  When asked about stools, has had black stools and is not on iron at this time, did have IV iron in MAY.

## 2020-07-06 NOTE — Anesthesia Postprocedure Evaluation (Signed)
Anesthesia Post Note  Patient: Blondina Coderre  Procedure(s) Performed: ESOPHAGOGASTRODUODENOSCOPY (EGD) WITH PROPOFOL (N/A )  Patient location during evaluation: PACU Anesthesia Type: General Level of consciousness: awake and alert Pain management: pain level controlled Vital Signs Assessment: post-procedure vital signs reviewed and stable Respiratory status: spontaneous breathing, nonlabored ventilation and respiratory function stable Cardiovascular status: blood pressure returned to baseline and stable Postop Assessment: no apparent nausea or vomiting Anesthetic complications: no   No complications documented.   Last Vitals:  Vitals:   07/06/20 1902 07/06/20 2043  BP: 109/72 102/61  Pulse: 90 93  Resp: 16 18  Temp: 36.6 C 36.4 C  SpO2: 100% 100%    Last Pain:  Vitals:   07/06/20 2220  TempSrc:   PainSc: 0-No pain                 Brett Canales Dervin Vore

## 2020-07-06 NOTE — Care Plan (Signed)
Transfer: Patient s/p ENDO procedure Husband at bedside -  AVSS  Report phoned to RN Patient alert and oriented at this time See Epic report for full details

## 2020-07-06 NOTE — ED Provider Notes (Signed)
[facility] Emergency Department Provider Note   ____________________________________________    I have reviewed the triage vital signs and the nursing notes.   HISTORY  Chief Complaint Loss of Consciousness     HPI Paige Robertson is a 44 y.o. female who presents after a near syncopal episode.  Patient reports that she felt very dizzy and lightheaded and almost passed out this morning.  She reports that she has a history of low hemoglobin and was eventually diagnosed with iron deficiency and did receive iron infusions, last received in May which did seem to help her hemoglobin.  However she does report that she had black stool last night which is atypical for her.  She is not taking p.o. iron.  Mild periumbilical abdominal discomfort, no fevers or chills.  Past Medical History:  Diagnosis Date  . Acid reflux   . Hematuria   . Hyperlipemia   . Perimenopausal symptoms   . Vaginal discharge     Patient Active Problem List   Diagnosis Date Noted  . Acid reflux   . Iron deficiency anemia 01/05/2020  . Hypopigmentation 05/26/2018  . SUI (stress urinary incontinence, female) 05/06/2017    Past Surgical History:  Procedure Laterality Date  . NO PAST SURGERIES      Prior to Admission medications   Medication Sig Start Date End Date Taking? Authorizing Provider  cetirizine (ZYRTEC) 10 MG tablet Take 10 mg by mouth daily.    [provider]  ferrous sulfate 325 (65 FE) MG tablet Take 325 mg by mouth every other day. QD for 7 days    [provider]  fluconazole (DIFLUCAN) 150 MG tablet Take 1 tablet (150 mg total) by mouth daily. Patient not taking: Reported on 04/10/2020 09/13/18   Defrancesco, Alanda Slim, MD  Multiple Vitamins-Calcium (ONE-A-DAY WOMENS FORMULA) TABS Take 1 tablet by mouth daily. 01/31/20   [provider]     Allergies Sulfa antibiotics  Family History  Problem Relation Age of Onset  . Diabetes Father     . Stroke Father   . Breast cancer Maternal Grandmother   . Breast cancer Paternal Aunt   . High Cholesterol Mother   . Colon cancer Neg Hx   . Ovarian cancer Neg Hx   . Heart disease Neg Hx     Social History Social History   Tobacco Use  . Smoking status: Never Smoker  . Smokeless tobacco: Never Used  Vaping Use  . Vaping Use: Never used  Substance Use Topics  . Alcohol use: No  . Drug use: No    Review of Systems  Constitutional: No fever/chills Eyes: No visual changes.  ENT: No sore throat. Cardiovascular: Denies chest pain. Respiratory: Denies shortness of breath. Gastrointestinal: As above Genitourinary: Negative for dysuria. Musculoskeletal: Negative for back pain. Skin: Negative for rash. Neurological: Negative for headaches    ____________________________________________   PHYSICAL EXAM:  VITAL SIGNS: ED Triage Vitals  Enc Vitals Group     BP 07/06/20 1204 (!) 88/60     Pulse Rate 07/06/20 1204 (!) 133     Resp 07/06/20 1204 20     Temp 07/06/20 1204 98.6 F (37 C)     Temp Source 07/06/20 1204 Oral     SpO2 07/06/20 1204 100 %     Weight 07/06/20 1205 61.2 kg (135 lb)     Height 07/06/20 1205 1.575 m (5\' 2" )     Head Circumference --  Peak Flow --      Pain Score 07/06/20 1205 0     Pain Loc --      Pain Edu? --      Excl. in [place]? --     Constitutional: Alert and oriented. Eyes: Conjunctivae are normal.  Head: Atraumatic. Nose: No congestion/rhinnorhea. Mouth/Throat: Mucous membranes are moist.   Neck:  Painless ROM Cardiovascular: Tachycardic, regular rhythm. Grossly normal heart sounds.  Good peripheral circulation. Respiratory: Normal respiratory effort.  No retractions.  Gastrointestinal: Soft and nontender. No distention.  Musculoskeletal:   Warm and well perfused Neurologic:  Normal speech and language. No gross focal neurologic deficits are appreciated.  Skin:  Skin is warm, dry and intact. No rash noted. Psychiatric: Mood  and affect are normal. Speech and behavior are normal.  ____________________________________________   LABS (all labs ordered are listed, but only abnormal results are displayed)  Labs Reviewed  CBC - Abnormal; Notable for the following components:      Result Value   WBC 12.1 (*)    RBC 3.48 (*)    Hemoglobin 10.6 (*)    HCT 31.2 (*)    All other components within normal limits  COMPREHENSIVE METABOLIC PANEL - Abnormal; Notable for the following components:   Glucose, Bld 144 (*)    BUN 33 (*)    All other components within normal limits  PROTIME-INR - Abnormal; Notable for the following components:   Prothrombin Time 11.1 (*)    All other components within normal limits  SARS CORONAVIRUS 2 BY RT PCR (HOSPITAL ORDER, [facility])  APTT  URINALYSIS, COMPLETE (UACMP) WITH MICROSCOPIC  POC URINE PREG, ED  TYPE AND SCREEN   ____________________________________________  EKG  ED ECG REPORT I, Lavonia Drafts, the attending physician, personally viewed and interpreted this ECG.  Date: 07/06/2020  Rhythm: Sinus tachycardia QRS Axis: normal Intervals: normal ST/T Wave abnormalities: Nonspecific changes Narrative Interpretation: no evidence of acute ischemia  ____________________________________________  RADIOLOGY  None ____________________________________________   PROCEDURES  Procedure(s) performed: No  Procedures   Critical Care performed: yes  CRITICAL CARE Performed by: Lavonia Drafts   Total critical care time: 30 minutes  Critical care time was exclusive of separately billable procedures and treating other patients.  Critical care was necessary to treat or prevent imminent or life-threatening deterioration.  Critical care was time spent personally by me on the following activities: development of treatment plan with patient and/or surrogate as well as nursing, discussions with consultants, evaluation of patient's response to  treatment, examination of patient, obtaining history from patient or surrogate, ordering and performing treatments and interventions, ordering and review of laboratory studies, ordering and review of radiographic studies, pulse oximetry and re-evaluation of patient's condition.  ____________________________________________   INITIAL IMPRESSION / ASSESSMENT AND PLAN / ED COURSE  Pertinent labs & imaging results that were available during my care of the patient were reviewed by me and considered in my medical decision making (see chart for details).  Patient presents with tachycardia, borderline blood pressure, pale and ill-appearing.  Differential for near syncopal episode includes GI bleed, vasovagal episode, arrhythmia.  Given reports of black stool last night and history of low hemoglobin strongly suspect upper GI bleed.  She does have periumbilical abdominal discomfort although reassuring abdominal exam.  IV fluids ordered, type and screen, coags, pending hemoglobin  Hemoglobin is 10.6, down from 12.2 however patient does have a black stool/melena, guaiac positive consistent with GI bleed.  Consulted Dr. Allen Norris he  has seen the patient in the emergency department, plan is for endoscopy  I discussed with hospitalist for admission as well.  Suspect patient will have continued decline in hemoglobin    ____________________________________________   FINAL CLINICAL IMPRESSION(S) / ED DIAGNOSES  Final diagnoses:  Acute upper GI bleed  Near syncope        Note:  This document was prepared using Dragon voice recognition software and may include unintentional dictation errors.   Lavonia Drafts, MD 07/06/20 1444

## 2020-07-06 NOTE — Op Note (Signed)
[facility] Gastroenterology Patient Name: Paige Robertson Procedure Date: 07/06/2020 3:53 PM MRN: [id number] Account #: [id number] Date of Birth: [birth year] Admit Type: Inpatient Age: 44 Room: [facility] ENDO ROOM 4 Gender: Female Note Status: Finalized Procedure:             Upper GI endoscopy Indications:           Melena Providers:             Lucilla Lame MD, MD Referring MD:          Benjaman Kindler Medicines:             Propofol per Anesthesia Complications:         No immediate complications. Procedure:             Pre-Anesthesia Assessment:                        - Prior to the procedure, a History and Physical was                         performed, and patient medications and allergies were                         reviewed. The patient's tolerance of previous                         anesthesia was also reviewed. The risks and benefits                         of the procedure and the sedation options and risks                         were discussed with the patient. All questions were                         answered, and informed consent was obtained. Prior                         Anticoagulants: The patient has taken no previous                         anticoagulant or antiplatelet agents. ASA Grade                         Assessment: II - A patient with mild systemic disease.                         After reviewing the risks and benefits, the patient                         was deemed in satisfactory condition to undergo the                         procedure.                        After obtaining informed consent, the endoscope was                         passed under  direct vision. Throughout the procedure,                         the patient's blood pressure, pulse, and oxygen                         saturations were monitored continuously. The Endoscope                         was introduced through the mouth, and advanced to the                          third part of duodenum. The upper GI endoscopy was                         accomplished without difficulty. The patient tolerated                         the procedure well. Findings:      The examined esophagus was normal.      Two angiodysplastic lesions with no bleeding were found in the gastric       body and in the gastric antrum. Coagulation for hemostasis using argon       plasma at 2 liters/minute and 30 watts was successful.      A few erosions without bleeding were found in the duodenal bulb and in       the second portion of the duodenum. Biopsies were taken with a cold       forceps for histology.      A few angiodysplastic lesions without bleeding were found in the second       portion of the duodenum. Coagulation for hemostasis using argon plasma       at 2 liters/minute and 30 watts was successful. Impression:            - Normal esophagus.                        - Two non-bleeding angiodysplastic lesions in the                         stomach. Treated with argon plasma coagulation (APC).                        - Duodenal erosions without bleeding. Biopsied.                        - A few non-bleeding angiodysplastic lesions in the                         duodenum. Treated with argon plasma coagulation (APC). Recommendation:        - Admit the patient to hospital ward for ongoing care.                        - Clear liquid diet.                        - Continue present medications.                        -  Await pathology results.                        - Use a proton pump inhibitor PO daily. Procedure Code(s):     --- Professional ---                        [phone], 25, Esophagogastroduodenoscopy, flexible,                         transoral; with control of bleeding, any method                        43239, Esophagogastroduodenoscopy, flexible,                         transoral; with biopsy, single or multiple Diagnosis Code(s):     --- Professional ---                         K92.1, Melena (includes Hematochezia)                        K31.819, Angiodysplasia of stomach and duodenum                         without bleeding                        K26.9, Duodenal ulcer, unspecified as acute or                         chronic, without hemorrhage or perforation CPT copyright 2019 [employer]. All rights reserved. The codes documented in this report are preliminary and upon coder review may  be revised to meet current compliance requirements. Lucilla Lame MD, MD 07/06/2020 4:46:08 PM This report has been signed electronically. Number of Addenda: 0 Note Initiated On: 07/06/2020 3:53 PM Estimated Blood Loss:  Estimated blood loss: none.      [facility]

## 2020-07-06 NOTE — Consult Note (Signed)
Lucilla Lame, MD [facility]  [address] [place], [place] [address] Phone: [phone] Fax : [phone]  Consultation  Referring Provider:     Dr. Corky Downs Primary Care Physician:  Benjaman Kindler, MD Primary Gastroenterologist:  Dr. Alice Reichert         Reason for Consultation:     Melena  Date of Admission:  07/06/2020 Date of Consultation:  07/06/2020         HPI:   Paige Robertson is a 44 y.o. female who is a very nice young lady who has a history of having a GI bleed and anemia in the past.  The patient was seen in February 2021 with a hemoglobin of 4.7 and she was transfused up to 7.8.  A repeat hemoglobin on June 23 showed her hemoglobin to be 12.2 and is now 10.6.  The patient reports that she felt very lightheaded and had a episode of black stools today.  She states that she has not had a solid meal since lunch yesterday.  She has been sipping on diet soda up until an hour ago.  The patient was seen by oncology/hematology for her anemia.  The patient was not able to handle oral iron and was put on IV iron.  The patient had an EGD and colonoscopy which did not reveal the source of the GI bleeding.  The patient's EGD and  colonoscopy was not done at our hospital and those reports are not available to me. In March 2019 the patient had a upper GI series with a small bowel follow-through with mild reflux otherwise reported to be unremarkable.  The patient also had a negative work-up for celiac sprue as reported by her GI clinic note from her primary GI doctor.  Past Medical History:  Diagnosis Date  . Acid reflux   . Hematuria   . Hyperlipemia   . Perimenopausal symptoms   . Vaginal discharge     Past Surgical History:  Procedure Laterality Date  . NO PAST SURGERIES      Prior to Admission medications   Medication Sig Start Date End Date Taking? Authorizing Provider  cetirizine (ZYRTEC) 10 MG tablet Take 10 mg by mouth daily.    [provider]  ferrous sulfate 325  (65 FE) MG tablet Take 325 mg by mouth every other day. QD for 7 days    [provider]  fluconazole (DIFLUCAN) 150 MG tablet Take 1 tablet (150 mg total) by mouth daily. Patient not taking: Reported on 04/10/2020 09/13/18   Defrancesco, Alanda Slim, MD  Multiple Vitamins-Calcium (ONE-A-DAY WOMENS FORMULA) TABS Take 1 tablet by mouth daily. 01/31/20   [provider]    Family History  Problem Relation Age of Onset  . Diabetes Father   . Stroke Father   . Breast cancer Maternal Grandmother   . Breast cancer Paternal Aunt   . High Cholesterol Mother   . Colon cancer Neg Hx   . Ovarian cancer Neg Hx   . Heart disease Neg Hx      Social History   Tobacco Use  . Smoking status: Never Smoker  . Smokeless tobacco: Never Used  Vaping Use  . Vaping Use: Never used  Substance Use Topics  . Alcohol use: No  . Drug use: No    Allergies as of 07/06/2020 - Review Complete 07/06/2020  Allergen Reaction Noted  . Sulfa antibiotics  04/18/2016    Review of Systems:    All systems reviewed and negative  except where noted in HPI.   Physical Exam:  Vital signs in last 24 hours: Temp:  [98.6 F (37 C)] 98.6 F (37 C) (08/06 1204) Pulse Rate:  [89-133] 89 (08/06 1330) Resp:  [16-20] 17 (08/06 1330) BP: (88-140)/(60-127) 94/67 (08/06 1330) SpO2:  [100 %] 100 % (08/06 1330) Weight:  [61.2 kg] 61.2 kg (08/06 1205)   General:   Pleasant, cooperative in NAD Head:  Normocephalic and atraumatic. Eyes:   No icterus.   Conjunctiva pink. PERRLA. Ears:  Normal auditory acuity. Neck:  Supple; no masses or thyroidomegaly Lungs: Respirations even and unlabored. Lungs clear to auscultation bilaterally.   No wheezes, crackles, or rhonchi.  Heart:  Regular rate and rhythm;  Without murmur, clicks, rubs or gallops Abdomen:  Soft, nondistended, mild epigastric tenderness. Normal bowel sounds. No appreciable masses or hepatomegaly.  No rebound or guarding.  Rectal:  Not performed. Msk:   Symmetrical without gross deformities.    Extremities:  Without edema, cyanosis or clubbing. Neurologic:  Alert and oriented x3;  grossly normal neurologically. Skin:  Intact without significant lesions or rashes. Cervical Nodes:  No significant cervical adenopathy. Psych:  Alert and cooperative. Normal affect.  LAB RESULTS: Recent Labs    07/06/20 1212  WBC 12.1*  HGB 10.6*  HCT 31.2*  PLT 272   BMET Recent Labs    07/06/20 1212  NA 137  K 4.4  CL 105  CO2 23  GLUCOSE 144*  BUN 33*  CREATININE 0.57  CALCIUM 9.5   LFT Recent Labs    07/06/20 1212  PROT 7.3  ALBUMIN 4.4  AST 26  ALT 28  ALKPHOS 63  BILITOT 0.5   PT/INR Recent Labs    07/06/20 1212  LABPROT 11.1*  INR 0.8    STUDIES: No results found.    Impression / Plan:   Assessment: Active Problems:   Iron deficiency anemia   Acid reflux   GI bleed   Paige Robertson is a 44 y.o. y/o female with who comes in with a report of dizziness and weakness.  She also reports that she had melena.  The patient and her husband are quite frustrated about this happening again after she had profound anemia back in February.  The patient appears to have gone through a work-up including an EGD and colonoscopy with but no further work-up with a capsule endoscopy.  Plan:  The patient will be set up for a upper endoscopy due to to her recent episode of melena with symptomatic anemia although her hemoglobin has not dropped to a dangerous level she has probably not had a significant amount of time to equilibrate.  The patient will be given a test for Covid and when that is back we will proceed with the upper endoscopy today.  The patient and her husband have been explained the plan and agree with it.  Thank you for involving me in the care of this patient.      LOS: 0 days   Lucilla Lame, MD  07/06/2020, 3:08 PM Pager [phone] 7am-5pm  Check AMION for 5pm -7am coverage and on weekends   Note: This dictation was  prepared with Dragon dictation along with smaller phrase technology. Any transcriptional errors that result from this process are unintentional.

## 2020-07-06 NOTE — Telephone Encounter (Signed)
Call returned to patient and advised her that per VERBAL ORDER Dr Tasia Catchings to go to the ER. She stated "ok, thank you"

## 2020-07-07 DIAGNOSIS — D509 Iron deficiency anemia, unspecified: Secondary | ICD-10-CM

## 2020-07-07 DIAGNOSIS — K921 Melena: Secondary | ICD-10-CM

## 2020-07-07 LAB — COMPREHENSIVE METABOLIC PANEL
ALT: 37 U/L (ref 0–44)
AST: 31 U/L (ref 15–41)
Albumin: 3.5 g/dL (ref 3.5–5.0)
Alkaline Phosphatase: 49 U/L (ref 38–126)
Anion gap: 7 (ref 5–15)
BUN: 15 mg/dL (ref 6–20)
CO2: 23 mmol/L (ref 22–32)
Calcium: 8.7 mg/dL — ABNORMAL LOW (ref 8.9–10.3)
Chloride: 108 mmol/L (ref 98–111)
Creatinine, Ser: 0.5 mg/dL (ref 0.44–1.00)
GFR calc Af Amer: >60 mL/min (ref >60)
GFR calc non Af Amer: >60 mL/min (ref >60)
Glucose, Bld: 100 mg/dL — ABNORMAL HIGH (ref 70–99)
Potassium: 3.7 mmol/L (ref 3.5–5.1)
Sodium: 138 mmol/L (ref 135–145)
Total Bilirubin: 0.5 mg/dL (ref 0.3–1.2)
Total Protein: 5.7 g/dL — ABNORMAL LOW (ref 6.5–8.1)

## 2020-07-07 LAB — CBC
HCT: 21.2 % — ABNORMAL LOW (ref 36.0–46.0)
Hemoglobin: 7 g/dL — ABNORMAL LOW (ref 12.0–15.0)
MCH: 30.6 pg (ref 26.0–34.0)
MCHC: 33 g/dL (ref 30.0–36.0)
MCV: 92.6 fL (ref 80.0–100.0)
Platelets: 174 10*3/uL (ref 150–400)
RBC: 2.29 MIL/uL — ABNORMAL LOW (ref 3.87–5.11)
RDW: 15 % (ref 11.5–15.5)
WBC: 17.8 10*3/uL — ABNORMAL HIGH (ref 4.0–10.5)
nRBC: 0 % (ref 0.0–0.2)

## 2020-07-07 LAB — URINALYSIS, COMPLETE (UACMP) WITH MICROSCOPIC
Bilirubin Urine: NEGATIVE
Glucose, UA: NEGATIVE mg/dL
Ketones, ur: NEGATIVE mg/dL
Nitrite: NEGATIVE
Protein, ur: NEGATIVE mg/dL
Specific Gravity, Urine: 1.017 (ref 1.005–1.030)
WBC, UA: >50 WBC/hpf — ABNORMAL HIGH (ref 0–5)
pH: 5 (ref 5.0–8.0)

## 2020-07-07 LAB — HIV ANTIBODY (ROUTINE TESTING W REFLEX): HIV Screen 4th Generation wRfx: NONREACTIVE

## 2020-07-07 LAB — PREPARE RBC (CROSSMATCH)

## 2020-07-07 LAB — HEMOGLOBIN: Hemoglobin: 7.9 g/dL — ABNORMAL LOW (ref 12.0–15.0)

## 2020-07-07 MED ORDER — SODIUM CHLORIDE 0.9% IV SOLUTION: Freq: Once | INTRAVENOUS | Status: AC

## 2020-07-07 MED ORDER — ACETAMINOPHEN 325 MG PO TABS
650.0000 mg | ORAL_TABLET | Freq: Four times a day (QID) | ORAL | Status: DC | PRN
  Administered 2020-07-08: 650 mg via ORAL
  Filled 2020-07-07: qty 2

## 2020-07-07 MED ORDER — ACETAMINOPHEN 500 MG PO TABS
1000.0000 mg | ORAL_TABLET | Freq: Once | ORAL | Status: AC
  Administered 2020-07-07: 1000 mg via ORAL
  Filled 2020-07-07: qty 2

## 2020-07-07 NOTE — Progress Notes (Addendum)
Paige Antigua, MD [address] [place], [place], [place], [address] [address], [place], [place], [place], [address] Phone: [phone]  Fax: [phone]   Subjective: Patient denies any bowel movements yesterday or today.  No abdominal pain.  No nausea or vomiting.   Objective: Exam: Vital signs in last 24 hours: Vitals:   07/06/20 1747 07/06/20 1902 07/06/20 2043 07/07/20 0539  BP: 113/69 109/72 102/61 105/65  Pulse: 88 90 93 96  Resp: 19 16 18 20   Temp:  97.9 F (36.6 C) 97.6 F (36.4 C) 98.3 F (36.8 C)  TempSrc:  Oral Oral Oral  SpO2: 100% 100% 100% 100%  Weight:      Height:       Weight change:   Intake/Output Summary (Last 24 hours) at 07/07/2020 0955 Last data filed at 07/07/2020 0536 Gross per 24 hour  Intake 100 ml  Output 800 ml  Net -700 ml    General: No acute distress, AAO x3 Abd: Soft, NT/ND, No HSM Skin: Warm, no rashes Neck: Supple, Trachea midline   Lab Results: Lab Results  Component Value Date   WBC 17.8 (H) 07/07/2020   HGB 7.0 (L) 07/07/2020   HCT 21.2 (L) 07/07/2020   MCV 92.6 07/07/2020   PLT 174 07/07/2020   Micro Results: Recent Results (from the past 240 hour(s))  SARS Coronavirus 2 by RT PCR (hospital order, performed in [place] hospital lab) Nasopharyngeal Nasopharyngeal Swab     Status: None   Collection Time: 07/06/20  2:43 PM   Specimen: Nasopharyngeal Swab  Result Value Ref Range Status   SARS Coronavirus 2 NEGATIVE NEGATIVE Final    Comment: (NOTE) SARS-CoV-2 target nucleic acids are NOT DETECTED.  The SARS-CoV-2 RNA is generally detectable in upper and lower respiratory specimens during the acute phase of infection. The lowest concentration of SARS-CoV-2 viral copies this assay can detect is 250 copies / mL. A negative result does not preclude SARS-CoV-2 infection and should not be used as the sole basis for treatment or other patient management decisions.  A negative result may occur  with improper specimen collection / handling, submission of specimen other than nasopharyngeal swab, presence of viral mutation(s) within the areas targeted by this assay, and inadequate number of viral copies (<250 copies / mL). A negative result must be combined with clinical observations, patient history, and epidemiological information.  Fact Sheet for Patients:   StrictlyIdeas.no  Fact Sheet for Healthcare Providers: BankingDealers.co.za  This test is not yet approved or  cleared by the [place] FDA and has been authorized for detection and/or diagnosis of SARS-CoV-2 by FDA under an Emergency Use Authorization (EUA).  This EUA will remain in effect (meaning this test can be used) for the duration of the COVID-19 declaration under Section 564(b)(1) of the Act, 21 U.S.C. section 360bbb-3(b)(1), unless the authorization is terminated or revoked sooner.  Performed at [facility], [address] [place], [place] [address]    Studies/Results: No results found. Medications:  Scheduled Meds: . sodium chloride   Intravenous Once  . pantoprazole (PROTONIX) IV  40 mg Intravenous Q12H   Continuous Infusions: PRN Meds:.   Assessment: Principal Problem:   GI bleed Active Problems:   Iron deficiency anemia   Acid reflux   Angiodysplasia of duodenum    Plan: Patient has not had any active GI bleeding since the procedure However, hemoglobin has dropped compared to yesterday and PRBC transfusion has been ordered by primary team  No indication  for repeat EGD at this time unless active bleeding occurs given that she has had full work-up recently including EGD, colonoscopy and capsule study  Would recommend rechecking iron panel and iron transfusion if indicated  However, if hemoglobin does not respond well to PRBC transfusion or continues to drop, repeat upper endoscopy may need to be considered  PPI IV twice  daily  Continue serial CBCs and transfuse PRN Avoid NSAIDs Maintain 2 large-bore IV lines Please page GI with any acute hemodynamic changes, or signs of active GI bleeding  We do not have a previous procedure reports.  These were done by [facility] clinic GI and are not available in her chart.  However, her PCP note, Dr. Tressia Miners is from May 2021 states "Celiac disease labs are negative. Seen by gastroenterologist and on oral Protonix. Denies any NSAID usage. -EGD, colonoscopy and capsule endoscopy were normal."     LOS: 1 day   Paige Antigua, MD 07/07/2020, 9:55 AM

## 2020-07-07 NOTE — Progress Notes (Signed)
PROGRESS NOTE    Paige Robertson  KDX:[id number] DOB: 03/16/1976 DOA: 07/06/2020 PCP: Benjaman Kindler, MD    Assessment & Plan:   Principal Problem:   GI bleed Active Problems:   Iron deficiency anemia   Acid reflux   Angiodysplasia of duodenum    Paige Robertson is a 44 y.o. female with medical history significant of  Anemia and GIB seen in ed for black stools. Pt states in march of this year pt had anemia found on labs at pcp and  Had egd and c-scopy and was told studies showed it was negative. Pt generally had taken ibuprofen and takes it for her periods. Has not taken it since march of this year.   This time pt has passed out and was light headed last night about 7 pm while running and errand.   # Melena 2/2 upper GI bleed # Angiodysplasia of stomach and duodenum --EGD yesterday, found non-bleeding angiodysplastic lesions, treated with APC.  No more melena seen since then. PLAN:  --GI following --IV PPI BID --avoid NSAIDs --keep on clear liquid diet  # Acute blood loss anemia from GI bleed --Hgb dropped to 7 today from 10.6. PLAN: --1u pRBC today --transfuse PRN to keep Hgb >7    DVT prophylaxis: SCD/Compression stockings Code Status: Full code  Family Communication: husband updated at bedside today Status is: inpatient Dispo:   The patient is from: home Anticipated d/c is to: home Anticipated d/c date is: 1-2 days Patient currently is not medically stable to d/c due to: dropping Hgb, requiring blood transfusion, ongoing GI workup.   Subjective and Interval History:  No BM since EGD, no melena.  No N/V or abdominal pain.  Complained of headache.  Hgb dropped to 7.  Getting 1u pRBC.   Objective: Vitals:   07/07/20 1137 07/07/20 1138 07/07/20 1220 07/07/20 1503  BP: (!) 95/58 98/62 96/67  93/60  Pulse: 94  100 87  Resp: 20  20 20   Temp: 98.3 F (36.8 C)  98 F (36.7 C) 98.5 F (36.9 C)  TempSrc: Oral  Oral Oral  SpO2: 100%  100%   Weight:        Height:        Intake/Output Summary (Last 24 hours) at 07/07/2020 1632 Last data filed at 07/07/2020 1503 Gross per 24 hour  Intake 610 ml  Output 1000 ml  Net -390 ml   Filed Weights   07/06/20 1205 07/06/20 1621  Weight: 61.2 kg 61.2 kg    Examination:   Constitutional: NAD, AAOx3 HEENT: conjunctivae and lids normal, EOMI CV: RRR tachycardic. Distal pulses +2.  No cyanosis.   RESP: CTA B/L, normal respiratory effort  GI: +BS, NTND, soft Extremities: No effusions, edema, or tenderness in BLE SKIN: warm, dry and intact Neuro: II - XII grossly intact.  Sensation intact Psych: Normal mood and affect.  Appropriate judgement and reason   Data Reviewed: I have personally reviewed following labs and imaging studies  CBC: Recent Labs  Lab 07/06/20 1212 07/07/20 0749 07/07/20 1558  WBC 12.1* 17.8*  --   HGB 10.6* 7.0* 7.9*  HCT 31.2* 21.2*  --   MCV 89.7 92.6  --   PLT 272 174  --    Basic Metabolic Panel: Recent Labs  Lab 07/06/20 1212 07/07/20 0749  NA 137 138  K 4.4 3.7  CL 105 108  CO2 23 23  GLUCOSE 144* 100*  BUN 33* 15  CREATININE 0.57 0.50  CALCIUM 9.5 8.7*  GFR: Estimated Creatinine Clearance: 78 mL/min (by C-G formula based on SCr of 0.5 mg/dL). Liver Function Tests: Recent Labs  Lab 07/06/20 1212 07/07/20 0749  AST 26 31  ALT 28 37  ALKPHOS 63 49  BILITOT 0.5 0.5  PROT 7.3 5.7*  ALBUMIN 4.4 3.5   No results for input(s): LIPASE, AMYLASE in the last 168 hours. No results for input(s): AMMONIA in the last 168 hours. Coagulation Profile: Recent Labs  Lab 07/06/20 1212  INR 0.8   Cardiac Enzymes: No results for input(s): CKTOTAL, CKMB, CKMBINDEX, TROPONINI in the last 168 hours. BNP (last 3 results) No results for input(s): PROBNP in the last 8760 hours. HbA1C: No results for input(s): HGBA1C in the last 72 hours. CBG: No results for input(s): GLUCAP in the last 168 hours. Lipid Profile: No results for input(s): CHOL, HDL, LDLCALC,  TRIG, CHOLHDL, LDLDIRECT in the last 72 hours. Thyroid Function Tests: Recent Labs    07/06/20 1212  TSH 3.531  FREET4 0.71   Anemia Panel: No results for input(s): VITAMINB12, FOLATE, FERRITIN, TIBC, IRON, RETICCTPCT in the last 72 hours. Sepsis Labs: No results for input(s): PROCALCITON, LATICACIDVEN in the last 168 hours.  Recent Results (from the past 240 hour(s))  SARS Coronavirus 2 by RT PCR (hospital order, performed in [facility] hospital lab) Nasopharyngeal Nasopharyngeal Swab     Status: None   Collection Time: 07/06/20  2:43 PM   Specimen: Nasopharyngeal Swab  Result Value Ref Range Status   SARS Coronavirus 2 NEGATIVE NEGATIVE Final    Comment: (NOTE) SARS-CoV-2 target nucleic acids are NOT DETECTED.  The SARS-CoV-2 RNA is generally detectable in upper and lower respiratory specimens during the acute phase of infection. The lowest concentration of SARS-CoV-2 viral copies this assay can detect is 250 copies / mL. A negative result does not preclude SARS-CoV-2 infection and should not be used as the sole basis for treatment or other patient management decisions.  A negative result may occur with improper specimen collection / handling, submission of specimen other than nasopharyngeal swab, presence of viral mutation(s) within the areas targeted by this assay, and inadequate number of viral copies (<250 copies / mL). A negative result must be combined with clinical observations, patient history, and epidemiological information.  Fact Sheet for Patients:   StrictlyIdeas.no  Fact Sheet for Healthcare Providers: BankingDealers.co.za  This test is not yet approved or  cleared by the [place] FDA and has been authorized for detection and/or diagnosis of SARS-CoV-2 by FDA under an Emergency Use Authorization (EUA).  This EUA will remain in effect (meaning this test can be used) for the duration of the COVID-19  declaration under Section 564(b)(1) of the Act, 21 U.S.C. section 360bbb-3(b)(1), unless the authorization is terminated or revoked sooner.  Performed at [facility], [address], [place], [place] [address]       Radiology Studies: No results found.   Scheduled Meds: . pantoprazole (PROTONIX) IV  40 mg Intravenous Q12H   Continuous Infusions:   LOS: 1 day     Enzo Bi, MD [facility] If 7PM-7AM, please contact night-coverage 07/07/2020, 4:32 PM

## 2020-07-08 ENCOUNTER — Encounter
Admission: EM | Disposition: A | Discharge status: Home / Self Care | Payer: Self-pay | Source: Home / Self Care | Attending: Hospitalist

## 2020-07-08 HISTORY — PX: GIVENS CAPSULE STUDY: SHX5432

## 2020-07-08 LAB — CBC
HCT: 21.8 % — ABNORMAL LOW (ref 36.0–46.0)
Hemoglobin: 7.1 g/dL — ABNORMAL LOW (ref 12.0–15.0)
MCH: 30.3 pg (ref 26.0–34.0)
MCHC: 32.6 g/dL (ref 30.0–36.0)
MCV: 93.2 fL (ref 80.0–100.0)
Platelets: 150 10*3/uL (ref 150–400)
RBC: 2.34 MIL/uL — ABNORMAL LOW (ref 3.87–5.11)
RDW: 14.6 % (ref 11.5–15.5)
WBC: 7.9 10*3/uL (ref 4.0–10.5)
nRBC: 0 % (ref 0.0–0.2)

## 2020-07-08 LAB — TYPE AND SCREEN
ABO/RH(D): O POS
Antibody Screen: NEGATIVE
Unit division: 0

## 2020-07-08 LAB — IRON AND TIBC
Iron: 16 ug/dL — ABNORMAL LOW (ref 28–170)
Saturation Ratios: 6 % — ABNORMAL LOW (ref 10.4–31.8)
TIBC: 290 ug/dL (ref 250–450)
UIBC: 274 ug/dL

## 2020-07-08 LAB — BASIC METABOLIC PANEL
Anion gap: 5 (ref 5–15)
BUN: 10 mg/dL (ref 6–20)
CO2: 26 mmol/L (ref 22–32)
Calcium: 8.7 mg/dL — ABNORMAL LOW (ref 8.9–10.3)
Chloride: 107 mmol/L (ref 98–111)
Creatinine, Ser: 0.64 mg/dL (ref 0.44–1.00)
GFR calc Af Amer: >60 mL/min (ref >60)
GFR calc non Af Amer: >60 mL/min (ref >60)
Glucose, Bld: 102 mg/dL — ABNORMAL HIGH (ref 70–99)
Potassium: 3.6 mmol/L (ref 3.5–5.1)
Sodium: 138 mmol/L (ref 135–145)

## 2020-07-08 LAB — BPAM RBC
Blood Product Expiration Date: 202109072359
ISSUE DATE / TIME: 202108071151
Unit Type and Rh: 5100

## 2020-07-08 LAB — FOLATE: Folate: 18.1 ng/mL (ref >5.9)

## 2020-07-08 LAB — MAGNESIUM: Magnesium: 2.3 mg/dL (ref 1.7–2.4)

## 2020-07-08 LAB — VITAMIN B12: Vitamin B-12: 264 pg/mL (ref 180–914)

## 2020-07-08 SURGERY — IMAGING PROCEDURE, GI TRACT, INTRALUMINAL, VIA CAPSULE

## 2020-07-08 MED ORDER — IRON DEXTRAN 50 MG/ML IJ SOLN: 100.0000 mg | Freq: Once | INTRAMUSCULAR | Status: DC

## 2020-07-08 MED ORDER — SODIUM CHLORIDE 0.9 % IV SOLN
200.0000 mg | Freq: Once | INTRAVENOUS | Status: AC
  Administered 2020-07-08: 200 mg via INTRAVENOUS
  Filled 2020-07-08: qty 10

## 2020-07-08 NOTE — Progress Notes (Signed)
PROGRESS NOTE    Paige Robertson  MOQ:[id number] DOB: 1976-07-09 DOA: 07/06/2020 PCP: Benjaman Kindler, MD    Assessment & Plan:   Principal Problem:   GI bleed Active Problems:   Iron deficiency anemia   Acid reflux   Angiodysplasia of duodenum    Paige Robertson is a 44 y.o. Caucasian female with medical history significant of  Anemia and GIB seen in ed for black stools. Pt states in march of this year pt had anemia found on labs at pcp and  Had egd and c-scopy and was told studies showed it was negative. Pt generally had taken ibuprofen and takes it for her periods. Has not taken it since march of this year.   This time pt had passed out and was light headed the night before while running and errand.   # Melena 2/2 upper GI bleed # Angiodysplasia of stomach and duodenum --EGD, found non-bleeding angiodysplastic lesions, treated with APC.  No more melena seen since then. --1u pRBC on 8/7 --Hgb dropped to pre-transfusion level this morning.   --GI looked into clinic record, and pt had not had a capsule study as her PCP had documented.   PLAN:  --capsule study tonight --IV PPI BID  # Acute blood loss anemia from GI bleed --Hgb dropped to 7 on 8/7 from 10.6, received 1u pRBC.  This morning, Hgb dropped back down to 7.1. PLAN: --transfuse PRN to keep Hgb >7   # Iron def anemia --anemia workup showed folate and vit B12 wnl, iron low. PLAN: --IV iron x1 today   DVT prophylaxis: SCD/Compression stockings Code Status: Full code  Family Communication: husband updated at bedside today, who was extremely belligerent  Status is: inpatient Dispo:   The patient is from: home Anticipated d/c is to: home Anticipated d/c date is: 2-3 days Patient currently is not medically stable to d/c due to: dropping Hgb, requiring blood transfusion, ongoing GI workup.   Subjective and Interval History:  No melena or bloody BM since yesterday, however, Hgb dropped to pre-transfusion level  this morning.    GI looked into clinic record, and pt had not had a capsule study as her PCP had documented.  Capsule study planned for tonight.   Objective: Vitals:   07/07/20 1503 07/07/20 1943 07/08/20 0420 07/08/20 1222  BP: 93/60 98/60 93/63  96/61  Pulse: 87 90 75 84  Resp: 20 16 16 14   Temp: 98.5 F (36.9 C) 98.2 F (36.8 C) 97.9 F (36.6 C) 98.4 F (36.9 C)  TempSrc: Oral  Oral Oral  SpO2:  100% 100% 100%  Weight:      Height:        Intake/Output Summary (Last 24 hours) at 07/08/2020 1457 Last data filed at 07/08/2020 1332 Gross per 24 hour  Intake 310 ml  Output 900 ml  Net -590 ml   Filed Weights   07/06/20 1205 07/06/20 1621  Weight: 61.2 kg 61.2 kg    Examination:   Constitutional: NAD, AAOx3 HEENT: conjunctivae and lids normal, EOMI CV: No cyanosis.   RESP: normal respiratory effort  MSK: normal ROM and strength, no joint enlargement or tenderness of both UE and LE Neuro: II - XII grossly intact.     Data Reviewed: I have personally reviewed following labs and imaging studies  CBC: Recent Labs  Lab 07/06/20 1212 07/07/20 0749 07/07/20 1558 07/08/20 0459  WBC 12.1* 17.8*  --  7.9  HGB 10.6* 7.0* 7.9* 7.1*  HCT 31.2* 21.2*  --  21.8*  MCV 89.7 92.6  --  93.2  PLT 272 174  --  419   Basic Metabolic Panel: Recent Labs  Lab 07/06/20 1212 07/07/20 0749 07/08/20 0459  NA 137 138 138  K 4.4 3.7 3.6  CL 105 108 107  CO2 23 23 26   GLUCOSE 144* 100* 102*  BUN 33* 15 10  CREATININE 0.57 0.50 0.64  CALCIUM 9.5 8.7* 8.7*  MG  --   --  2.3   GFR: Estimated Creatinine Clearance: 78 mL/min (by C-G formula based on SCr of 0.64 mg/dL). Liver Function Tests: Recent Labs  Lab 07/06/20 1212 07/07/20 0749  AST 26 31  ALT 28 37  ALKPHOS 63 49  BILITOT 0.5 0.5  PROT 7.3 5.7*  ALBUMIN 4.4 3.5   No results for input(s): LIPASE, AMYLASE in the last 168 hours. No results for input(s): AMMONIA in the last 168 hours. Coagulation Profile: Recent  Labs  Lab 07/06/20 1212  INR 0.8   Cardiac Enzymes: No results for input(s): CKTOTAL, CKMB, CKMBINDEX, TROPONINI in the last 168 hours. BNP (last 3 results) No results for input(s): PROBNP in the last 8760 hours. HbA1C: No results for input(s): HGBA1C in the last 72 hours. CBG: No results for input(s): GLUCAP in the last 168 hours. Lipid Profile: No results for input(s): CHOL, HDL, LDLCALC, TRIG, CHOLHDL, LDLDIRECT in the last 72 hours. Thyroid Function Tests: Recent Labs    07/06/20 1212  TSH 3.531  FREET4 0.71   Anemia Panel: Recent Labs    07/08/20 0459 07/08/20 1012  VITAMINB12  --  264  FOLATE 18.1  --   TIBC 290  --   IRON 16*  --    Sepsis Labs: No results for input(s): PROCALCITON, LATICACIDVEN in the last 168 hours.  Recent Results (from the past 240 hour(s))  SARS Coronavirus 2 by RT PCR (hospital order, performed in [facility] hospital lab) Nasopharyngeal Nasopharyngeal Swab     Status: None   Collection Time: 07/06/20  2:43 PM   Specimen: Nasopharyngeal Swab  Result Value Ref Range Status   SARS Coronavirus 2 NEGATIVE NEGATIVE Final    Comment: (NOTE) SARS-CoV-2 target nucleic acids are NOT DETECTED.  The SARS-CoV-2 RNA is generally detectable in upper and lower respiratory specimens during the acute phase of infection. The lowest concentration of SARS-CoV-2 viral copies this assay can detect is 250 copies / mL. A negative result does not preclude SARS-CoV-2 infection and should not be used as the sole basis for treatment or other patient management decisions.  A negative result may occur with improper specimen collection / handling, submission of specimen other than nasopharyngeal swab, presence of viral mutation(s) within the areas targeted by this assay, and inadequate number of viral copies (<250 copies / mL). A negative result must be combined with clinical observations, patient history, and epidemiological information.  Fact Sheet for  Patients:   StrictlyIdeas.no  Fact Sheet for Healthcare Providers: BankingDealers.co.za  This test is not yet approved or  cleared by the [place] FDA and has been authorized for detection and/or diagnosis of SARS-CoV-2 by FDA under an Emergency Use Authorization (EUA).  This EUA will remain in effect (meaning this test can be used) for the duration of the COVID-19 declaration under Section 564(b)(1) of the Act, 21 U.S.C. section 360bbb-3(b)(1), unless the authorization is terminated or revoked sooner.  Performed at [facility], [address] [place], [place] [address]       Radiology Studies: No results  found.   Scheduled Meds: . pantoprazole (PROTONIX) IV  40 mg Intravenous Q12H   Continuous Infusions:   LOS: 2 days     Enzo Bi, MD [facility] If 7PM-7AM, please contact night-coverage 07/08/2020, 2:57 PM

## 2020-07-08 NOTE — Progress Notes (Signed)
Even though PCP note stated that pt has had capsule study, pt denies ever having a capsule study done and Dr. Collie Siad note confirms the same. Due to Hemoglobin remaining low next step would be cpasule study. Pt had and finished clear liquids at 8 am this morning. ASGE guidelines recommends NPO status 12 hrs prior to capsule study. Therefore, capsule study planned for today evening. Pt to remain NPO until then and diet instructions post administration will be left in patient's room by endo staff administering the capsule today evening. Capsule study will be read tomorrow by GI on call staff. Pt denies any melena or active bleeding yesterday or today.

## 2020-07-08 NOTE — Progress Notes (Signed)
Vonda Antigua, MD [address], [place], [place], [place], [address] [address], [place], [place], [place], [address] Phone: [phone]  Fax: [phone]   Subjective: Patient denies any abdominal pain.  Had one bowel movement today that she states was more brown than black.  Husband at bedside and patient and family had several questions which were discussed and answered to their satisfaction   Objective: Exam: Vital signs in last 24 hours: Vitals:   07/07/20 1503 07/07/20 1943 07/08/20 0420 07/08/20 1222  BP: 93/60 98/60 93/63  96/61  Pulse: 87 90 75 84  Resp: 20 16 16 14   Temp: 98.5 F (36.9 C) 98.2 F (36.8 C) 97.9 F (36.6 C) 98.4 F (36.9 C)  TempSrc: Oral  Oral Oral  SpO2:  100% 100% 100%  Weight:      Height:       Weight change:   Intake/Output Summary (Last 24 hours) at 07/08/2020 1634 Last data filed at 07/08/2020 1332 Gross per 24 hour  Intake --  Output 900 ml  Net -900 ml    General: No acute distress, AAO x3 Abd: Soft, NT/ND, No HSM Skin: Warm, no rashes Neck: Supple, Trachea midline   Lab Results: Lab Results  Component Value Date   WBC 7.9 07/08/2020   HGB 7.1 (L) 07/08/2020   HCT 21.8 (L) 07/08/2020   MCV 93.2 07/08/2020   PLT 150 07/08/2020   Micro Results: Recent Results (from the past 240 hour(s))  SARS Coronavirus 2 by RT PCR (hospital order, performed in [place] hospital lab) Nasopharyngeal Nasopharyngeal Swab     Status: None   Collection Time: 07/06/20  2:43 PM   Specimen: Nasopharyngeal Swab  Result Value Ref Range Status   SARS Coronavirus 2 NEGATIVE NEGATIVE Final    Comment: (NOTE) SARS-CoV-2 target nucleic acids are NOT DETECTED.  The SARS-CoV-2 RNA is generally detectable in upper and lower respiratory specimens during the acute phase of infection. The lowest concentration of SARS-CoV-2 viral copies this assay can detect is 250 copies / mL. A negative result does not preclude SARS-CoV-2 infection and  should not be used as the sole basis for treatment or other patient management decisions.  A negative result may occur with improper specimen collection / handling, submission of specimen other than nasopharyngeal swab, presence of viral mutation(s) within the areas targeted by this assay, and inadequate number of viral copies (<250 copies / mL). A negative result must be combined with clinical observations, patient history, and epidemiological information.  Fact Sheet for Patients:   StrictlyIdeas.no  Fact Sheet for Healthcare Providers: BankingDealers.co.za  This test is not yet approved or  cleared by the [place] FDA and has been authorized for detection and/or diagnosis of SARS-CoV-2 by FDA under an Emergency Use Authorization (EUA).  This EUA will remain in effect (meaning this test can be used) for the duration of the COVID-19 declaration under Section 564(b)(1) of the Act, 21 U.S.C. section 360bbb-3(b)(1), unless the authorization is terminated or revoked sooner.  Performed at [facility], [address] [place], [place] [address]    Studies/Results: No results found. Medications:  Scheduled Meds: . pantoprazole (PROTONIX) IV  40 mg Intravenous Q12H   Continuous Infusions: PRN Meds:.acetaminophen   Assessment: Principal Problem:   GI bleed Active Problems:   Iron deficiency anemia   Acid reflux   Angiodysplasia of duodenum    Plan: "Cornelia Copa" or Gerarda Gunther, [facility] PA, was helpful enough to access patients call clinic  GI records and and states that patient did not have a capsule study.  EGD and colonoscopy impression as per below  "EGD/Colon 02/21/20 Normal esophagus stomach and duodenum Normal appearing TI and colon, no specimens collected in colon. Grade I internal hemorrhoids. Duodenal bx negative."  I do not have access to the procedure report itself  Since patient has not had  a capsule study done and continues to drop her hemoglobin, next it would be capsule study for further evaluation  Continue serial CBCs and transfuse as needed Avoid NSAIDs PPI twice daily  Capsule study will be administered today as detailed in my previous note  I have discussed alternative options, risks & benefits,  which include, but are not limited to, bleeding, infection, perforation,respiratory complication & drug reaction.  The patient agrees with this plan & written consent will be obtained.    In addition the above, the risks of small bowel capsule getting stuck in the GI tract were discussed. Need for surgery for removal of the capsule, if this occurs were discussed as well. This usually occurs if a stricture, mass or abnormality is present in the small bowel. Pt verbalized understanding and is agreeable to proceed with small bowel capsule study.   Pt remains hemodynamically stable  Given her low iron level, would recommend IV iron transfusion as an inpatient  Possible source may be underlying AVMs in the small bowel and maintaining good iron levels would be beneficial for avoiding anemia in the future as well   LOS: 2 days   Vonda Antigua, MD 07/08/2020, 4:34 PM

## 2020-07-09 ENCOUNTER — Inpatient Hospital Stay: Payer: BC Managed Care – PPO | Admitting: Family

## 2020-07-09 ENCOUNTER — Encounter: Payer: Self-pay | Admitting: Gastroenterology

## 2020-07-09 ENCOUNTER — Encounter
Admission: EM | Disposition: A | Discharge status: Home / Self Care | Payer: Self-pay | Source: Home / Self Care | Attending: Hospitalist

## 2020-07-09 ENCOUNTER — Other Ambulatory Visit: Payer: Self-pay

## 2020-07-09 DIAGNOSIS — K922 Gastrointestinal hemorrhage, unspecified: Secondary | ICD-10-CM

## 2020-07-09 DIAGNOSIS — K31819 Angiodysplasia of stomach and duodenum without bleeding: Secondary | ICD-10-CM

## 2020-07-09 DIAGNOSIS — D500 Iron deficiency anemia secondary to blood loss (chronic): Secondary | ICD-10-CM

## 2020-07-09 HISTORY — PX: ENTEROSCOPY: SHX5533

## 2020-07-09 LAB — CBC
HCT: 22.7 % — ABNORMAL LOW (ref 36.0–46.0)
Hemoglobin: 7.8 g/dL — ABNORMAL LOW (ref 12.0–15.0)
MCH: 31.5 pg (ref 26.0–34.0)
MCHC: 34.4 g/dL (ref 30.0–36.0)
MCV: 91.5 fL (ref 80.0–100.0)
Platelets: 184 10*3/uL (ref 150–400)
RBC: 2.48 MIL/uL — ABNORMAL LOW (ref 3.87–5.11)
RDW: 14.6 % (ref 11.5–15.5)
WBC: 6.9 10*3/uL (ref 4.0–10.5)
nRBC: 0.3 % — ABNORMAL HIGH (ref 0.0–0.2)

## 2020-07-09 LAB — BASIC METABOLIC PANEL
Anion gap: 5 (ref 5–15)
BUN: 9 mg/dL (ref 6–20)
CO2: 28 mmol/L (ref 22–32)
Calcium: 8.8 mg/dL — ABNORMAL LOW (ref 8.9–10.3)
Chloride: 105 mmol/L (ref 98–111)
Creatinine, Ser: 0.48 mg/dL (ref 0.44–1.00)
GFR calc Af Amer: >60 mL/min (ref >60)
GFR calc non Af Amer: >60 mL/min (ref >60)
Glucose, Bld: 97 mg/dL (ref 70–99)
Potassium: 3.7 mmol/L (ref 3.5–5.1)
Sodium: 138 mmol/L (ref 135–145)

## 2020-07-09 LAB — FERRITIN: Ferritin: 46 ng/mL (ref 11–307)

## 2020-07-09 LAB — MAGNESIUM: Magnesium: 2.5 mg/dL — ABNORMAL HIGH (ref 1.7–2.4)

## 2020-07-09 SURGERY — ENTEROSCOPY
Anesthesia: General

## 2020-07-09 MED ORDER — GLYCOPYRROLATE 0.2 MG/ML IJ SOLN
INTRAMUSCULAR | Status: AC
  Filled 2020-07-09: qty 1

## 2020-07-09 MED ORDER — PROPOFOL 10 MG/ML IV BOLUS
INTRAVENOUS | Status: AC
  Filled 2020-07-09: qty 20

## 2020-07-09 MED ORDER — SODIUM CHLORIDE 0.9 % IV SOLN: INTRAVENOUS | Status: DC | PRN

## 2020-07-09 MED ORDER — PROPOFOL 500 MG/50ML IV EMUL
INTRAVENOUS | Status: DC | PRN
  Administered 2020-07-09: 100 ug/kg/min via INTRAVENOUS

## 2020-07-09 MED ORDER — FERROUS SULFATE 325 (65 FE) MG PO TABS: 325.0000 mg | ORAL_TABLET | Freq: Every day | ORAL | 3 refills | Status: DC

## 2020-07-09 MED ORDER — CYANOCOBALAMIN 1000 MCG PO TABS: 1000.0000 ug | ORAL_TABLET | Freq: Every day | ORAL | Status: DC

## 2020-07-09 MED ORDER — LIDOCAINE HCL (PF) 2 % IJ SOLN
INTRAMUSCULAR | Status: AC
  Filled 2020-07-09: qty 5

## 2020-07-09 MED ORDER — VITAMIN B-12 1000 MCG PO TABS
1000.0000 ug | ORAL_TABLET | Freq: Every day | ORAL | Status: DC
  Administered 2020-07-09: 1000 ug via ORAL
  Filled 2020-07-09: qty 1

## 2020-07-09 MED ORDER — PROPOFOL 10 MG/ML IV BOLUS
INTRAVENOUS | Status: DC | PRN
  Administered 2020-07-09: 50 mg via INTRAVENOUS

## 2020-07-09 MED ORDER — SODIUM CHLORIDE 0.9 % IV SOLN
200.0000 mg | Freq: Once | INTRAVENOUS | Status: AC
  Administered 2020-07-09: 200 mg via INTRAVENOUS
  Filled 2020-07-09: qty 10

## 2020-07-09 MED ORDER — LIDOCAINE HCL (CARDIAC) PF 100 MG/5ML IV SOSY
PREFILLED_SYRINGE | INTRAVENOUS | Status: DC | PRN
  Administered 2020-07-09: 50 mg via INTRAVENOUS

## 2020-07-09 MED ORDER — GLYCOPYRROLATE 0.2 MG/ML IJ SOLN
INTRAMUSCULAR | Status: DC | PRN
  Administered 2020-07-09: .2 mg via INTRAVENOUS

## 2020-07-09 NOTE — Op Note (Signed)
[facility] Patient Name: Paige Robertson Procedure Date: 07/09/2020 2:44 PM MRN: [id number] Account #: [id number] Date of Birth: 05-Jun-1976 Admit Type: Outpatient Age: 44 Room: [facility] ENDO ROOM 4 Gender: Female Note Status: Finalized Procedure:             Small bowel enteroscopy Indications:           Iron deficiency anemia secondary to chronic blood                         loss, , Arteriovenous malformation in the stomach,                         Arteriovenous malformation in the small intestine                        Abnormal VCE Providers:             Lin Landsman MD, MD Medicines:             Monitored Anesthesia Care Complications:         No immediate complications. Estimated blood loss: None. Procedure:             Pre-Anesthesia Assessment:                        - Prior to the procedure, a History and Physical was                         performed, and patient medications and allergies were                         reviewed. The patient is competent. The risks and                         benefits of the procedure and the sedation options and                         risks were discussed with the patient. All questions                         were answered and informed consent was obtained.                         Patient identification and proposed procedure were                         verified by the physician, the nurse, the                         anesthesiologist, the anesthetist and the technician                         in the pre-procedure area in the procedure room in the                         endoscopy suite. Mental Status Examination: alert and                         oriented. Airway Examination: normal oropharyngeal  airway and neck mobility. Respiratory Examination:                         clear to auscultation. CV Examination: normal.                         Prophylactic Antibiotics: The patient  does not require                         prophylactic antibiotics. Prior Anticoagulants: The                         patient has taken no previous anticoagulant or                         antiplatelet agents. ASA Grade Assessment: II - A                         patient with mild systemic disease. After reviewing                         the risks and benefits, the patient was deemed in                         satisfactory condition to undergo the procedure. The                         anesthesia plan was to use monitored anesthesia care                         (MAC). Immediately prior to administration of                         medications, the patient was re-assessed for adequacy                         to receive sedatives. The heart rate, respiratory                         rate, oxygen saturations, blood pressure, adequacy of                         pulmonary ventilation, and response to care were                         monitored throughout the procedure. The physical                         status of the patient was re-assessed after the                         procedure.                        After obtaining informed consent, the endoscope was                         passed under direct vision. Throughout the procedure,  the patient's blood pressure, pulse, and oxygen                         saturations were monitored continuously. The                         Colonoscope was introduced through the mouth and                         advanced to the fourth part of duodenum. The small                         bowel enteroscopy was accomplished without difficulty.                         The patient tolerated the procedure well. Findings:      There was no evidence of significant pathology in the entire examined       duodenum. Ulcers at the site of recently treated AVMs are identified      The esophagus was normal.      The stomach was normal. Impression:             - Normal examined duodenum.                        - Normal esophagus.                        - Normal stomach.                        - No specimens collected. Recommendation:        - Return patient to hospital ward for possible                         discharge same day.                        - Resume regular diet.                        - No ibuprofen, naproxen, or other non-steroidal                         anti-inflammatory drugs.                        - Return to GI clinic in 1 month. Procedure Code(s):     --- Professional ---                        [phone], Small intestinal endoscopy, enteroscopy beyond                         second portion of duodenum, not including ileum;                         diagnostic, including collection of specimen(s) by                         brushing or washing, when performed (separate  procedure) Diagnosis Code(s):     --- Professional ---                        D50.0, Iron deficiency anemia secondary to blood loss                         (chronic)                        K31.819, Angiodysplasia of stomach and duodenum                         without bleeding CPT copyright 2019 American Medical Association. All rights reserved. The codes documented in this report are preliminary and upon coder review may  be revised to meet current compliance requirements. Dr. Ulyess Mort Lin Landsman MD, MD 07/09/2020 3:15:27 PM This report has been signed electronically. Number of Addenda: 0 Note Initiated On: 07/09/2020 2:44 PM Estimated Blood Loss:  Estimated blood loss: none.      [facility]

## 2020-07-09 NOTE — Anesthesia Postprocedure Evaluation (Signed)
Anesthesia Post Note  Patient: Paige Robertson  Procedure(s) Performed: ENTEROSCOPY (N/A )  Patient location during evaluation: Endoscopy Anesthesia Type: General Level of consciousness: awake and alert Pain management: pain level controlled Vital Signs Assessment: post-procedure vital signs reviewed and stable Respiratory status: spontaneous breathing and respiratory function stable Cardiovascular status: stable Anesthetic complications: no   No complications documented.   Last Vitals:  Vitals:   07/09/20 1540 07/09/20 1616  BP: 102/70 104/69  Pulse:  72  Resp:  16  Temp:  36.6 C  SpO2:  100%    Last Pain:  Vitals:   07/09/20 1616  TempSrc: Oral  PainSc:                  Avory Mimbs K

## 2020-07-09 NOTE — Progress Notes (Signed)
Paige Robertson  A and O x 4. VSS. Pt tolerating diet well. No complaints of pain or nausea. IV removed intact. Pt voiced understanding of discharge instructions with no further questions. Pt discharged via wheelchair.    Allergies as of 07/09/2020      Reactions   Sulfa Antibiotics       Medication List    STOP taking these medications   fluconazole 150 MG tablet Commonly known as: Diflucan     TAKE these medications   cetirizine 10 MG tablet Commonly known as: ZYRTEC Take 10 mg by mouth daily.   cyanocobalamin 1000 MCG tablet Take 1 tablet (1,000 mcg total) by mouth daily. Can get any over-the-counter Vitamin B-12 supplement. Start taking on: July 10, 2020   ferrous sulfate 325 (65 FE) MG tablet Take 1 tablet (325 mg total) by mouth daily with breakfast. Can get any over-the-counter iron supplement. What changed:   when to take this  additional instructions   One-A-Day Womens Formula Tabs Take 1 tablet by mouth daily.       Vitals:   07/09/20 1540 07/09/20 1616  BP: 102/70 104/69  Pulse:  72  Resp:  16  Temp:  97.9 F (36.6 C)  SpO2:  100%    Francesco Sor

## 2020-07-09 NOTE — Progress Notes (Signed)
VCE report: Studies complete, capsule reach cecum Normal small bowel transit Bleeding AVM identified in the duodenum  Discussed with patient about push enteroscopy and she prefers to undergo the procedure today Her last intake of clear liquids was at noon, will proceed with procedure today  Cephas Darby, MD [place]  [place], [place] [address]  Main: [phone]  Fax: [phone] Pager: [phone]

## 2020-07-09 NOTE — Transfer of Care (Signed)
Immediate Anesthesia Transfer of Care Note  Patient: Paige Robertson  Procedure(s) Performed: ENTEROSCOPY (N/A )  Patient Location: PACU  Anesthesia Type:General  Level of Consciousness: awake, alert  and oriented  Airway & Oxygen Therapy: Patient Spontanous Breathing  Post-op Assessment: Report given to RN  Post vital signs: Reviewed and stable  Last Vitals:  Vitals Value Taken Time  BP 99/61 07/09/20 1522  Temp    Pulse 94 07/09/20 1522  Resp 16 07/09/20 1522  SpO2 100 % 07/09/20 1522    Last Pain:  Vitals:   07/09/20 1449  TempSrc:   PainSc: 0-No pain         Complications: No complications documented.

## 2020-07-09 NOTE — Anesthesia Preprocedure Evaluation (Signed)
Anesthesia Evaluation  Patient identified by MRN, date of birth, ID band Patient awake    Reviewed: Allergy & Precautions, H&P , NPO status , Patient's Chart, lab work & pertinent test results  History of Anesthesia Complications Negative for: history of anesthetic complications  Airway Mallampati: II  TM Distance: >3 FB     Dental  (+) Teeth Intact   Pulmonary neg pulmonary ROS, neg sleep apnea, neg COPD,    breath sounds clear to auscultation       Cardiovascular (-) angina(-) Past MI and (-) Cardiac Stents negative cardio ROS  (-) dysrhythmias  Rhythm:regular Rate:Normal     Neuro/Psych negative neurological ROS  negative psych ROS   GI/Hepatic Neg liver ROS, GERD  ,GI bleeding   Endo/Other  negative endocrine ROS  Renal/GU negative Renal ROS  Female GU complaint     Musculoskeletal   Abdominal   Peds  Hematology  (+) Blood dyscrasia, anemia ,   Anesthesia Other Findings Past Medical History: No date: Acid reflux No date: Hematuria No date: Hyperlipemia No date: Perimenopausal symptoms No date: Vaginal discharge  Past Surgical History: No date: NO PAST SURGERIES  BMI    Body Mass Index: 24.69 kg/m      Reproductive/Obstetrics negative OB ROS                             Anesthesia Physical  Anesthesia Plan  ASA: II  Anesthesia Plan: General   Post-op Pain Management:    Induction:   PONV Risk Score and Plan: Propofol infusion and TIVA  Airway Management Planned: Nasal Cannula  Additional Equipment:   Intra-op Plan:   Post-operative Plan:   Informed Consent: I have reviewed the patients History and Physical, chart, labs and discussed the procedure including the risks, benefits and alternatives for the proposed anesthesia with the patient or authorized representative who has indicated his/her understanding and acceptance.     Dental Advisory  Given  Plan Discussed with: Anesthesiologist, CRNA and Surgeon  Anesthesia Plan Comments:         Anesthesia Quick Evaluation

## 2020-07-09 NOTE — Discharge Summary (Signed)
Physician Discharge Summary   Paige Robertson  female DOB: 30-Apr-1976  DVV:[id number]  PCP: Paige Kindler, MD  Admit date: 07/06/2020 Discharge date: 07/09/2020  Admitted From: home Disposition:  home CODE STATUS: Full code   Hospital Course:  For full details, please see H&P, progress notes, consult notes and ancillary notes.  Briefly,  Paige Robertson a 44 y.o.Caucasian femalewith medical history significant ofAnemia and GIBseen in ed for black stools. Pt states in march of this year pt had anemia found on labs at pcp and Had egd and c-scopy and was told studies showed it was negative. Pt generally had taken ibuprofen and takes it for her periods. Has not taken it since march of this year.   This time pt had passed out and was light headed the night before while running and errand.   # Melena 2/2 upper GI bleed # Angiodysplasia of stomach and duodenum EGD found non-bleeding angiodysplastic lesions, treated with APC.  Received 1u pRBC on 8/7, however, Hgb dropped to pre-transfusion level next morning, so GI performed a capsule study (inpatient GI looked into clinic record, and pt had not had a capsule study as her PCP had documented) followed by small bowel endoscopy which did not find any more bleeding lesion.  GI cleared pt for discharge to follow up with pt's outpatient GI provider.  # Acute blood loss anemia from GI bleed # Iron def anemia Hgb dropped to 7 on 8/7 from 10.6, received 1u pRBC.  Anemia workup showed folate and vit B12 wnl, iron low.  Pt also received IV iron for iron def.    Discharge Diagnoses:  Principal Problem:   GI bleed Active Problems:   Iron deficiency anemia   Acid reflux   Angiodysplasia of duodenum    Discharge Instructions:  Allergies as of 07/09/2020      Reactions   Sulfa Antibiotics       Medication List    STOP taking these medications   fluconazole 150 MG tablet Commonly known as: Diflucan     TAKE these medications    cetirizine 10 MG tablet Commonly known as: ZYRTEC Take 10 mg by mouth daily.   cyanocobalamin 1000 MCG tablet Take 1 tablet (1,000 mcg total) by mouth daily. Can get any over-the-counter Vitamin B-12 supplement. Start taking on: July 10, 2020   ferrous sulfate 325 (65 FE) MG tablet Take 1 tablet (325 mg total) by mouth daily with breakfast. Can get any over-the-counter iron supplement. What changed:   when to take this  additional instructions   One-A-Day Womens Formula Tabs Take 1 tablet by mouth daily.        Follow-up Paige Road, MD Follow up in 1 month(s).   Specialty: Gastroenterology Contact information: [place] [address] [phone]        Paige Kindler, MD. Schedule an appointment as soon as possible for a visit in 1 week(s).   Specialty: Obstetrics and Gynecology Contact information: [place] [address] [phone]               Allergies  Allergen Reactions  . Sulfa Antibiotics      The results of significant diagnostics from this hospitalization (including imaging, microbiology, ancillary and laboratory) are listed below for reference.   Consultations:   Procedures/Studies: No results found.    Labs: BNP (last 3 results) No results for input(s): BNP in the last 8760 hours. Basic Metabolic Panel: Recent Labs  Lab 07/06/20 1212 07/07/20 0749 07/08/20 0459 07/09/20 0502  NA 137 138 138 138  K 4.4 3.7 3.6 3.7  CL 105 108 107 105  CO2 23 23 26 28   GLUCOSE 144* 100* 102* 97  BUN 33* 15 10 9   CREATININE 0.57 0.50 0.64 0.48  CALCIUM 9.5 8.7* 8.7* 8.8*  MG  --   --  2.3 2.5*   Liver Function Tests: Recent Labs  Lab 07/06/20 1212 07/07/20 0749  AST 26 31  ALT 28 37  ALKPHOS 63 49  BILITOT 0.5 0.5  PROT 7.3 5.7*  ALBUMIN 4.4 3.5   No results for input(s): LIPASE, AMYLASE in the last 168 hours. No results for input(s): AMMONIA in the last 168  hours. CBC: Recent Labs  Lab 07/06/20 1212 07/07/20 0749 07/07/20 1558 07/08/20 0459 07/09/20 0502  WBC 12.1* 17.8*  --  7.9 6.9  HGB 10.6* 7.0* 7.9* 7.1* 7.8*  HCT 31.2* 21.2*  --  21.8* 22.7*  MCV 89.7 92.6  --  93.2 91.5  PLT 272 174  --  150 184   Cardiac Enzymes: No results for input(s): CKTOTAL, CKMB, CKMBINDEX, TROPONINI in the last 168 hours. BNP: Invalid input(s): POCBNP CBG: No results for input(s): GLUCAP in the last 168 hours. D-Dimer No results for input(s): DDIMER in the last 72 hours. Hgb A1c No results for input(s): HGBA1C in the last 72 hours. Lipid Profile No results for input(s): CHOL, HDL, LDLCALC, TRIG, CHOLHDL, LDLDIRECT in the last 72 hours. Thyroid function studies No results for input(s): TSH, T4TOTAL, T3FREE, THYROIDAB in the last 72 hours.  Invalid input(s): FREET3 Anemia work up Recent Labs    07/08/20 0459 07/08/20 1012 07/09/20 0502  VITAMINB12  --  264  --   FOLATE 18.1  --   --   FERRITIN  --   --  46  TIBC 290  --   --   IRON 16*  --   --    Urinalysis    Component Value Date/Time   COLORURINE YELLOW (A) 07/07/2020 0537   APPEARANCEUR CLOUDY (A) 07/07/2020 0537   LABSPEC 1.017 07/07/2020 0537   PHURINE 5.0 07/07/2020 0537   GLUCOSEU NEGATIVE 07/07/2020 0537   HGBUR SMALL (A) 07/07/2020 0537   BILIRUBINUR NEGATIVE 07/07/2020 0537   KETONESUR NEGATIVE 07/07/2020 0537   PROTEINUR NEGATIVE 07/07/2020 0537   NITRITE NEGATIVE 07/07/2020 0537   LEUKOCYTESUR LARGE (A) 07/07/2020 0537   Sepsis Labs Invalid input(s): PROCALCITONIN,  WBC,  LACTICIDVEN Microbiology Recent Results (from the past 240 hour(s))  SARS Coronavirus 2 by RT PCR (hospital order, performed in [place] hospital lab) Nasopharyngeal Nasopharyngeal Swab     Status: None   Collection Time: 07/06/20  2:43 PM   Specimen: Nasopharyngeal Swab  Result Value Ref Range Status   SARS Coronavirus 2 NEGATIVE NEGATIVE Final    Comment: (NOTE) SARS-CoV-2 target nucleic  acids are NOT DETECTED.  The SARS-CoV-2 RNA is generally detectable in upper and lower respiratory specimens during the acute phase of infection. The lowest concentration of SARS-CoV-2 viral copies this assay can detect is 250 copies / mL. A negative result does not preclude SARS-CoV-2 infection and should not be used as the sole basis for treatment or other patient management decisions.  A negative result may occur with improper specimen collection / handling, submission of specimen other than nasopharyngeal swab, presence of viral mutation(s) within the areas targeted by this assay, and inadequate number of viral copies (<250 copies / mL). A negative  result must be combined with clinical observations, patient history, and epidemiological information.  Fact Sheet for Patients:   StrictlyIdeas.no  Fact Sheet for Healthcare Providers: BankingDealers.co.za  This test is not yet approved or  cleared by the [place] FDA and has been authorized for detection and/or diagnosis of SARS-CoV-2 by FDA under an Emergency Use Authorization (EUA).  This EUA will remain in effect (meaning this test can be used) for the duration of the COVID-19 declaration under Section 564(b)(1) of the Act, 21 U.S.C. section 360bbb-3(b)(1), unless the authorization is terminated or revoked sooner.  Performed at [facility], [address] [place], [place] [address]      Total time spend on discharging this patient, including the last patient exam, discussing the hospital stay, instructions for ongoing care as it relates to all pertinent caregivers, as well as preparing the medical discharge records, prescriptions, and/or referrals as applicable, is 30 minutes.    Enzo Bi, MD  [facility] 07/09/2020, 3:35 PM  If 7PM-7AM, please contact night-coverage

## 2020-07-10 ENCOUNTER — Encounter: Payer: Self-pay | Admitting: Gastroenterology

## 2020-07-10 LAB — SURGICAL PATHOLOGY

## 2020-07-11 ENCOUNTER — Encounter: Payer: Self-pay | Admitting: Gastroenterology

## 2020-08-22 ENCOUNTER — Encounter: Payer: Self-pay | Admitting: Oncology

## 2020-08-27 ENCOUNTER — Other Ambulatory Visit: Payer: Self-pay

## 2020-08-27 ENCOUNTER — Inpatient Hospital Stay: Payer: BC Managed Care – PPO

## 2020-08-27 DIAGNOSIS — Z79899 Other long term (current) drug therapy: Secondary | ICD-10-CM | POA: Diagnosis not present

## 2020-08-27 DIAGNOSIS — D500 Iron deficiency anemia secondary to blood loss (chronic): Secondary | ICD-10-CM | POA: Insufficient documentation

## 2020-08-27 DIAGNOSIS — D508 Other iron deficiency anemias: Secondary | ICD-10-CM

## 2020-08-27 LAB — FERRITIN: Ferritin: 15 ng/mL (ref 11–307)

## 2020-08-28 ENCOUNTER — Inpatient Hospital Stay: Payer: BC Managed Care – PPO

## 2020-08-28 ENCOUNTER — Encounter: Payer: Self-pay | Admitting: Oncology

## 2020-08-28 ENCOUNTER — Inpatient Hospital Stay: Payer: BC Managed Care – PPO | Admitting: Oncology

## 2020-08-28 ENCOUNTER — Inpatient Hospital Stay: Payer: BC Managed Care – PPO | Attending: Oncology | Admitting: Oncology

## 2020-08-28 DIAGNOSIS — D509 Iron deficiency anemia, unspecified: Secondary | ICD-10-CM | POA: Diagnosis not present

## 2020-08-28 DIAGNOSIS — D500 Iron deficiency anemia secondary to blood loss (chronic): Secondary | ICD-10-CM | POA: Diagnosis not present

## 2020-08-28 NOTE — Progress Notes (Signed)
Pt contacted for Mychart visit. Pt reports having and endoscopy since last visit.

## 2020-08-28 NOTE — Progress Notes (Signed)
HEMATOLOGY-ONCOLOGY TeleHEALTH VISIT PROGRESS NOTE  I connected with Paige Robertson on 08/28/20 at  2:00 PM EDT by video enabled telemedicine visit and verified that I am speaking with the correct person using two identifiers. I discussed the limitations, risks, security and privacy concerns of performing an evaluation and management service by telemedicine and the availability of in-person appointments. I also discussed with the patient that there may be a patient responsible charge related to this service. The patient expressed understanding and agreed to proceed.   Other persons participating in the visit and their role in the encounter:  None  Patient's location: Home  Provider's location: office Chief Complaint: Iron deficiency anemia   INTERVAL HISTORY Paige Robertson is a 44 y.o. female who has above history reviewed by me today presents for follow up visit for management of iron deficiency anemia Problems and complaints are listed below:  Encounter was changed to telemedicine due to patient's daughter had a potential exposure to Covid.  Patient has no symptoms.  She has had IV Venofer treatments and the fatigue level has improved Patient was admitted from 07/06/2020-07/09/2020 due to GI bleeding and melena. 07/06/2020 EGD found nonbleeding angioplastic lesion treated with APC.  Patient received PRBC transfusion during her hospitalization.  Patient has a capsule study 07/09/2020 which did not find any more bleeding lesions. Today she denies any additional episodes of melena, bloody stool.  Denies any pain. Review of Systems  Constitutional: Negative for appetite change, chills, fatigue and fever.  HENT:   Negative for hearing loss and voice change.   Eyes: Negative for eye problems.  Respiratory: Negative for chest tightness and cough.   Cardiovascular: Negative for chest pain.  Gastrointestinal: Negative for abdominal distention, abdominal pain and blood in stool.  Endocrine: Negative for  hot flashes.  Genitourinary: Negative for difficulty urinating and frequency.   Musculoskeletal: Negative for arthralgias.  Skin: Negative for itching and rash.  Neurological: Negative for extremity weakness.  Hematological: Negative for adenopathy.  Psychiatric/Behavioral: Negative for confusion.    Past Medical History:  Diagnosis Date  . Acid reflux   . Hematuria   . Hyperlipemia   . IDA (iron deficiency anemia)   . Melena   . Perimenopausal symptoms   . Vaginal discharge    Past Surgical History:  Procedure Laterality Date  . COLONOSCOPY WITH ESOPHAGOGASTRODUODENOSCOPY (EGD)    . ENTEROSCOPY N/A 07/09/2020   Procedure: ENTEROSCOPY;  Surgeon: Lin Landsman, MD;  Location: [facility] ENDOSCOPY;  Service: Gastroenterology;  Laterality: N/A;  . ESOPHAGOGASTRODUODENOSCOPY (EGD) WITH PROPOFOL N/A 07/06/2020   Procedure: ESOPHAGOGASTRODUODENOSCOPY (EGD) WITH PROPOFOL;  Surgeon: Lucilla Lame, MD;  Location: [facility] ENDOSCOPY;  Service: Endoscopy;  Laterality: N/A;  . GIVENS CAPSULE STUDY N/A 07/08/2020   Procedure: GIVENS CAPSULE STUDY;  Surgeon: Virgel Manifold, MD;  Location: [facility] ENDOSCOPY;  Service: Endoscopy;  Laterality: N/A;  Administer between 6-8 pm today  . NO PAST SURGERIES      Family History  Problem Relation Age of Onset  . Diabetes Father   . Stroke Father   . Breast cancer Maternal Grandmother   . Breast cancer Paternal Aunt   . High Cholesterol Mother   . Colon cancer Neg Hx   . Ovarian cancer Neg Hx   . Heart disease Neg Hx     Social History   Socioeconomic History  . Marital status: Married    Spouse name: Not on file  . Number of children: Not on file  . Years of education: Not on  file  . Highest education level: Not on file  Occupational History  . Not on file  Tobacco Use  . Smoking status: Never Smoker  . Smokeless tobacco: Never Used  Vaping Use  . Vaping Use: Never used  Substance and Sexual Activity  . Alcohol use: No  . Drug use: No  .  Sexual activity: Yes    Comment: vasectomy  Other Topics Concern  . Not on file  Social History Narrative  . Not on file   Social Determinants of Health   Financial Resource Strain:   . Difficulty of Paying Living Expenses: Not on file  Food Insecurity:   . Worried About Charity fundraiser in the Last Year: Not on file  . Ran Out of Food in the Last Year: Not on file  Transportation Needs:   . Lack of Transportation (Medical): Not on file  . Lack of Transportation (Non-Medical): Not on file  Physical Activity:   . Days of Exercise per Week: Not on file  . Minutes of Exercise per Session: Not on file  Stress:   . Feeling of Stress : Not on file  Social Connections:   . Frequency of Communication with Friends and Family: Not on file  . Frequency of Social Gatherings with Friends and Family: Not on file  . Attends Religious Services: Not on file  . Active Member of Clubs or Organizations: Not on file  . Attends Archivist Meetings: Not on file  . Marital Status: Not on file  Intimate Partner Violence:   . Fear of Current or Ex-Partner: Not on file  . Emotionally Abused: Not on file  . Physically Abused: Not on file  . Sexually Abused: Not on file    Current Outpatient Medications on File Prior to Visit  Medication Sig Dispense Refill  . cetirizine (ZYRTEC) 10 MG tablet Take 10 mg by mouth daily.    . famotidine (PEPCID) 40 MG tablet Take by mouth.    . ferrous sulfate 325 (65 FE) MG tablet Take 1 tablet (325 mg total) by mouth daily with breakfast. Can get any over-the-counter iron supplement.  3  . Multiple Vitamins-Calcium (ONE-A-DAY WOMENS FORMULA) TABS Take 1 tablet by mouth daily.    . vitamin B-12 1000 MCG tablet Take 1 tablet (1,000 mcg total) by mouth daily. Can get any over-the-counter Vitamin B-12 supplement.     No current facility-administered medications on file prior to visit.    Allergies  Allergen Reactions  . Sulfa Antibiotics         Observations/Objective: Today's Vitals   08/28/20 1350  PainSc: 0-No pain   There is no height or weight on file to calculate BMI.  Physical Exam Neurological:     Mental Status: She is alert.     CBC    Component Value Date/Time   WBC 6.9 07/09/2020 0502   RBC 2.48 (L) 07/09/2020 0502   HGB 7.8 (L) 07/09/2020 0502   HGB 13.1 10/01/2017 1115   HCT 22.7 (L) 07/09/2020 0502   HCT 39.8 10/01/2017 1115   PLT 184 07/09/2020 0502   PLT 307 10/01/2017 1115   MCV 91.5 07/09/2020 0502   MCV 93 10/01/2017 1115   MCH 31.5 07/09/2020 0502   MCHC 34.4 07/09/2020 0502   RDW 14.6 07/09/2020 0502   RDW 13.7 10/01/2017 1115   LYMPHSABS 2.0 05/23/2020 1351   LYMPHSABS 2.1 10/01/2017 1115   MONOABS 0.4 05/23/2020 1351   EOSABS 0.2 05/23/2020 1351  EOSABS 0.1 10/01/2017 1115   BASOSABS 0.1 05/23/2020 1351   BASOSABS 0.0 10/01/2017 1115    CMP     Component Value Date/Time   NA 138 07/09/2020 0502   NA 141 10/01/2017 1115   K 3.7 07/09/2020 0502   CL 105 07/09/2020 0502   CO2 28 07/09/2020 0502   GLUCOSE 97 07/09/2020 0502   BUN 9 07/09/2020 0502   BUN 10 10/01/2017 1115   CREATININE 0.48 07/09/2020 0502   CALCIUM 8.8 (L) 07/09/2020 0502   PROT 5.7 (L) 07/07/2020 0749   PROT 7.0 10/01/2017 1115   ALBUMIN 3.5 07/07/2020 0749   ALBUMIN 4.4 10/01/2017 1115   AST 31 07/07/2020 0749   ALT 37 07/07/2020 0749   ALKPHOS 49 07/07/2020 0749   BILITOT 0.5 07/07/2020 0749   BILITOT 0.7 10/01/2017 1115   GFRNONAA >60 07/09/2020 0502   GFRAA >60 07/09/2020 0502     Assessment and Plan: 1. 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 Labs are reviewed and discussed with patient.  She had blood work done at [facility] clinic.  Hemoglobin has improved to 11.5, iron saturation showed iron saturation 10%, TIBC 417. Ferritin was done at our office which is 15. Recommend to proceed with  additional IV Venofer 200 mg weekly x2. She may not need to take additional oral iron supplementation, assuming the bleeding etiology in the GI tract has been treated.  Plan to repeat blood work in 3 months to ensure the stability of iron store and hemoglobin.  Advised patient to call if she develops additional episodes of melena/profound fatigue.  She may need to get the blood work done earlier than 3 months if indicated.  Follow Up Instructions: 3 months   I discussed the assessment and treatment plan with the patient. The patient was provided an opportunity to ask questions and all were answered. The patient agreed with the plan and demonstrated an understanding of the instructions.  The patient was advised to call back or seek an in-person evaluation if the symptoms worsen or if the condition fails to improve as anticipated.    Earlie Server, MD 08/28/2020 4:30 PM

## 2020-09-04 ENCOUNTER — Inpatient Hospital Stay: Payer: BC Managed Care – PPO | Attending: Oncology

## 2020-09-04 ENCOUNTER — Other Ambulatory Visit: Payer: Self-pay

## 2020-09-04 VITALS — BP 98/56 | HR 64 | Temp 98.9°F | Resp 18

## 2020-09-04 DIAGNOSIS — D509 Iron deficiency anemia, unspecified: Secondary | ICD-10-CM

## 2020-09-04 MED ORDER — SODIUM CHLORIDE 0.9 % IV SOLN
Freq: Once | INTRAVENOUS | Status: AC
  Filled 2020-09-04: qty 250

## 2020-09-04 MED ORDER — IRON SUCROSE 20 MG/ML IV SOLN
200.0000 mg | Freq: Once | INTRAVENOUS | Status: AC
  Administered 2020-09-04: 200 mg via INTRAVENOUS
  Filled 2020-09-04: qty 10

## 2020-11-14 ENCOUNTER — Encounter: Payer: Self-pay | Admitting: Oncology

## 2020-11-15 ENCOUNTER — Other Ambulatory Visit
Admission: RE | Admit: 2020-11-15 | Discharge: 2020-11-15 | Disposition: A | Discharge status: Home / Self Care | Payer: BC Managed Care – PPO | Source: Ambulatory Visit | Attending: Internal Medicine | Admitting: Internal Medicine

## 2020-11-15 ENCOUNTER — Other Ambulatory Visit: Payer: Self-pay

## 2020-11-15 DIAGNOSIS — Z20822 Contact with and (suspected) exposure to covid-19: Secondary | ICD-10-CM | POA: Insufficient documentation

## 2020-11-15 DIAGNOSIS — Z01812 Encounter for preprocedural laboratory examination: Secondary | ICD-10-CM | POA: Insufficient documentation

## 2020-11-15 LAB — SARS CORONAVIRUS 2 (TAT 6-24 HRS): SARS Coronavirus 2: NEGATIVE

## 2020-11-16 ENCOUNTER — Encounter: Payer: Self-pay | Admitting: Internal Medicine

## 2020-11-19 ENCOUNTER — Ambulatory Visit: Payer: BC Managed Care – PPO | Admitting: Certified Registered"

## 2020-11-19 ENCOUNTER — Encounter: Payer: Self-pay | Admitting: Internal Medicine

## 2020-11-19 ENCOUNTER — Ambulatory Visit
Admission: RE | Admit: 2020-11-19 | Discharge: 2020-11-19 | Disposition: A | Discharge status: Home / Self Care | Payer: BC Managed Care – PPO | Attending: Internal Medicine | Admitting: Internal Medicine

## 2020-11-19 ENCOUNTER — Other Ambulatory Visit: Payer: Self-pay

## 2020-11-19 ENCOUNTER — Encounter
Admission: RE | Disposition: A | Discharge status: Home / Self Care | Payer: Self-pay | Source: Home / Self Care | Attending: Internal Medicine

## 2020-11-19 DIAGNOSIS — D500 Iron deficiency anemia secondary to blood loss (chronic): Secondary | ICD-10-CM | POA: Insufficient documentation

## 2020-11-19 DIAGNOSIS — K31819 Angiodysplasia of stomach and duodenum without bleeding: Secondary | ICD-10-CM | POA: Diagnosis not present

## 2020-11-19 DIAGNOSIS — K921 Melena: Secondary | ICD-10-CM | POA: Insufficient documentation

## 2020-11-19 DIAGNOSIS — Z882 Allergy status to sulfonamides status: Secondary | ICD-10-CM | POA: Diagnosis not present

## 2020-11-19 HISTORY — PX: ESOPHAGOGASTRODUODENOSCOPY (EGD) WITH PROPOFOL: SHX5813

## 2020-11-19 LAB — POCT PREGNANCY, URINE: Preg Test, Ur: NEGATIVE

## 2020-11-19 SURGERY — ESOPHAGOGASTRODUODENOSCOPY (EGD) WITH PROPOFOL
Anesthesia: General

## 2020-11-19 MED ORDER — PROPOFOL 10 MG/ML IV BOLUS
INTRAVENOUS | Status: AC
  Filled 2020-11-19: qty 20

## 2020-11-19 MED ORDER — PROPOFOL 500 MG/50ML IV EMUL
INTRAVENOUS | Status: AC
  Filled 2020-11-19: qty 50

## 2020-11-19 MED ORDER — GLYCOPYRROLATE 0.2 MG/ML IJ SOLN
INTRAMUSCULAR | Status: DC | PRN
  Administered 2020-11-19: .2 mg via INTRAVENOUS

## 2020-11-19 MED ORDER — LIDOCAINE HCL (CARDIAC) PF 100 MG/5ML IV SOSY
PREFILLED_SYRINGE | INTRAVENOUS | Status: DC | PRN
  Administered 2020-11-19: 50 mg via INTRAVENOUS

## 2020-11-19 MED ORDER — PROPOFOL 10 MG/ML IV BOLUS
INTRAVENOUS | Status: DC | PRN
  Administered 2020-11-19 (×2): 50 mg via INTRAVENOUS
  Administered 2020-11-19: 100 mg via INTRAVENOUS
  Administered 2020-11-19: 50 mg via INTRAVENOUS

## 2020-11-19 MED ORDER — SODIUM CHLORIDE 0.9 % IV SOLN: INTRAVENOUS | Status: DC

## 2020-11-19 NOTE — Op Note (Signed)
[facility] Gastroenterology Patient Name: Paige Robertson Procedure Date: 11/19/2020 11:45 AM MRN: [id number] Account #: [id number] Date of Birth: 1976-03-10 Admit Type: Outpatient Age: 44 Room: [facility] ENDO ROOM 2 Gender: Female Note Status: Finalized Procedure:             Upper GI endoscopy Indications:           Iron deficiency anemia secondary to chronic blood                         loss, Melena, Duodenal angiectasia with bleeding,                         Follow-up of bleeding duodenal angioectasia, For                         therapy of bleeding duodenal angioectasia Providers:             Boykin Nearing. Saray Capasso MD, MD Medicines:             Propofol per Anesthesia Complications:         No immediate complications. Estimated blood loss: None. Procedure:             Pre-Anesthesia Assessment:                        - The risks and benefits of the procedure and the                         sedation options and risks were discussed with the                         patient. All questions were answered and informed                         consent was obtained.                        - Patient identification and proposed procedure were                         verified prior to the procedure by the nurse. The                         procedure was verified in the procedure room.                        - ASA Grade Assessment: III - A patient with severe                         systemic disease.                        - After reviewing the risks and benefits, the patient                         was deemed in satisfactory condition to undergo the                         procedure.  After obtaining informed consent, the endoscope was                         passed under direct vision. Throughout the procedure,                         the patient's blood pressure, pulse, and oxygen                         saturations were monitored continuously. The  Endoscope                         was introduced through the mouth, and advanced to the                         proximal jejunum. The upper GI endoscopy was                         accomplished without difficulty. The patient tolerated                         the procedure well. Findings:      The esophagus was normal.      Mild gastric antral vascular ectasia without bleeding was present in the       gastric body, in the gastric antrum, in the prepyloric region of the       stomach and in the pylorus. Coagulation for bleeding prevention using       argon plasma at 0.8 liters/minute and 30 watts was successful. Estimated       blood loss: none.      Multiple diminutive angioectasias without bleeding were found in the       second portion of the duodenum and in the fourth portion of the       duodenum. Coagulation for bleeding prevention using argon plasma at 0.5       liters/minute and 20 watts was successful.      The exam was otherwise without abnormality. Impression:            - Normal esophagus.                        - Gastric antral vascular ectasia without bleeding.                         Treated with argon plasma coagulation (APC).                        - Multiple non-bleeding angioectasias in the duodenum.                         Treated with argon plasma coagulation (APC).                        - The examination was otherwise normal.                        - No specimens collected. Recommendation:        - Patient has a contact number available for  emergencies. The signs and symptoms of potential                         delayed complications were discussed with the patient.                         Return to normal activities tomorrow. Written                         discharge instructions were provided to the patient.                        - Resume previous diet.                        - Continue present medications.                        - Check  hemoglobin weekly.                        - Return to my office PRN.                        - The findings and recommendations were discussed with                         the patient. Procedure Code(s):     --- Professional ---                        [phone], Esophagogastroduodenoscopy, flexible,                         transoral; with control of bleeding, any method Diagnosis Code(s):     --- Professional ---                        K31.811, Angiodysplasia of stomach and duodenum with                         bleeding                        K92.1, Melena (includes Hematochezia)                        D50.0, Iron deficiency anemia secondary to blood loss                         (chronic) CPT copyright 2019 American Medical Association. All rights reserved. The codes documented in this report are preliminary and upon coder review may  be revised to meet current compliance requirements. Stanton Kidney MD, MD 11/19/2020 12:18:42 PM This report has been signed electronically. Number of Addenda: 0 Note Initiated On: 11/19/2020 11:45 AM Estimated Blood Loss:  Estimated blood loss: none.      [facility]

## 2020-11-19 NOTE — Anesthesia Postprocedure Evaluation (Signed)
Anesthesia Post Note  Patient: Paige Robertson  Procedure(s) Performed: ESOPHAGOGASTRODUODENOSCOPY (EGD) WITH PROPOFOL (N/A )  Patient location during evaluation: PACU Anesthesia Type: General Level of consciousness: awake and alert Pain management: pain level controlled Vital Signs Assessment: post-procedure vital signs reviewed and stable Respiratory status: spontaneous breathing, nonlabored ventilation and respiratory function stable Cardiovascular status: blood pressure returned to baseline and stable Postop Assessment: no apparent nausea or vomiting Anesthetic complications: no   No complications documented.   Last Vitals:  Vitals:   11/19/20 1247 11/19/20 1257  BP: 101/72 110/77  Pulse: 63 (!) 56  Resp: 16 19  Temp:    SpO2: 100% 100%    Last Pain:  Vitals:   11/19/20 1257  TempSrc:   PainSc: 0-No pain                 Brett Canales [name]

## 2020-11-19 NOTE — Anesthesia Preprocedure Evaluation (Signed)
Anesthesia Evaluation  Patient identified by MRN, date of birth, ID band Patient awake    Reviewed: Allergy & Precautions, H&P , NPO status , Patient's Chart, lab work & pertinent test results  History of Anesthesia Complications Negative for: history of anesthetic complications  Airway Mallampati: II  TM Distance: >3 FB     Dental  (+) Teeth Intact   Pulmonary neg pulmonary ROS, neg sleep apnea, neg COPD,    breath sounds clear to auscultation       Cardiovascular (-) angina(-) Past MI and (-) Cardiac Stents negative cardio ROS  (-) dysrhythmias  Rhythm:regular Rate:Normal     Neuro/Psych negative neurological ROS  negative psych ROS   GI/Hepatic Neg liver ROS, GERD  Controlled,  Endo/Other  negative endocrine ROS  Renal/GU negative Renal ROS  negative genitourinary   Musculoskeletal   Abdominal   Peds  Hematology  (+) Blood dyscrasia, anemia , Hgb 7.8   Anesthesia Other Findings Past Medical History: No date: Acid reflux No date: Hematuria No date: Hyperlipemia No date: IDA (iron deficiency anemia) No date: Melena No date: Perimenopausal symptoms No date: Vaginal discharge  Past Surgical History: No date: COLONOSCOPY WITH ESOPHAGOGASTRODUODENOSCOPY (EGD) 07/09/2020: ENTEROSCOPY; N/A     Comment:  Procedure: ENTEROSCOPY;  Surgeon: Lin Landsman,               MD;  Location: [facility] ENDOSCOPY;  Service:               Gastroenterology;  Laterality: N/A; 07/06/2020: ESOPHAGOGASTRODUODENOSCOPY (EGD) WITH PROPOFOL; N/A     Comment:  Procedure: ESOPHAGOGASTRODUODENOSCOPY (EGD) WITH               PROPOFOL;  Surgeon: Lucilla Lame, MD;  Location: [facility]               ENDOSCOPY;  Service: Endoscopy;  Laterality: N/A; 07/08/2020: GIVENS CAPSULE STUDY; N/A     Comment:  Procedure: GIVENS CAPSULE STUDY;  Surgeon: Virgel Manifold, MD;  Location: [facility] ENDOSCOPY;  Service:               Endoscopy;   Laterality: N/A;  Administer between 6-8 pm               today No date: NO PAST SURGERIES  BMI    Body Mass Index: 24.33 kg/m      Reproductive/Obstetrics negative OB ROS                             Anesthesia Physical Anesthesia Plan  ASA: II  Anesthesia Plan: General   Post-op Pain Management:    Induction:   PONV Risk Score and Plan: Propofol infusion and TIVA  Airway Management Planned: Nasal Cannula  Additional Equipment:   Intra-op Plan:   Post-operative Plan:   Informed Consent: I have reviewed the patients History and Physical, chart, labs and discussed the procedure including the risks, benefits and alternatives for the proposed anesthesia with the patient or authorized representative who has indicated his/her understanding and acceptance.     Dental Advisory Given  Plan Discussed with: Anesthesiologist, CRNA and Surgeon  Anesthesia Plan Comments:         Anesthesia Quick Evaluation

## 2020-11-19 NOTE — H&P (Signed)
Outpatient short stay form Pre-procedure 11/19/2020 11:47 AM Paige Robertson, M.D.  Primary Physician: Paige Robertson, M.D.  Reason for visit:  Melena, anemia secondary to gastrointestinal bleeding, history of gastric and duodenal angiodysplasia.  History of present illness:  Patient presents for EGD for possible hemostasis given recent symptoms of melena and symptomatic anemia. Patient has undergone EGD with APC cautery hemostasis by Dr. Allen Robertson in Sep 2021 for GI bleeding. Patient had recurrent bleeding earlier this month for which she saw Paige Blazer, PA-C in our office who set up repeat EGD for today for evaluation and possible need for repeat cautery and/or injection hemostasis.    Current Facility-Administered Medications:  .  0.9 %  sodium chloride infusion, , Intravenous, Continuous, [place], Benay Pike, MD, Last Rate: 20 mL/hr at 11/19/20 1143, Continued from Pre-op at 11/19/20 1143  Medications Prior to Admission  Medication Sig Dispense Refill Last Dose  . cetirizine (ZYRTEC) 10 MG tablet Take 10 mg by mouth daily.   Past Week at Unknown time  . famotidine (PEPCID) 40 MG tablet Take by mouth.   Past Week at Unknown time  . ferrous sulfate 325 (65 FE) MG tablet Take 1 tablet (325 mg total) by mouth daily with breakfast. Can get any over-the-counter iron supplement.  3 Past Week at Unknown time  . Multiple Vitamins-Calcium (ONE-A-DAY WOMENS FORMULA) TABS Take 1 tablet by mouth daily.   Past Week at Unknown time  . vitamin B-12 1000 MCG tablet Take 1 tablet (1,000 mcg total) by mouth daily. Can get any over-the-counter Vitamin B-12 supplement.   Past Week at Unknown time     Allergies  Allergen Reactions  . Sulfa Antibiotics      Past Medical History:  Diagnosis Date  . Acid reflux   . Hematuria   . Hyperlipemia   . IDA (iron deficiency anemia)   . Melena   . Perimenopausal symptoms   . Vaginal discharge     Review of systems:  Otherwise negative.    Physical  Exam  Gen: Alert, oriented. Appears stated age.  HEENT: [place]/AT. PERRLA. Lungs: CTA, no wheezes. CV: RR nl S1, S2. Abd: soft, benign, no masses. BS+ Ext: No edema. Pulses 2+    Planned procedures: Proceed with EGD. The patient understands the nature of the planned procedure, indications, risks, alternatives and potential complications including but not limited to bleeding, infection, perforation, damage to internal organs and possible oversedation/side effects from anesthesia. The patient agrees and gives consent to proceed.  Please refer to procedure notes for findings, recommendations and patient disposition/instructions.     [name] K. Paige Robertson, M.D. Gastroenterology 11/19/2020  11:47 AM

## 2020-11-19 NOTE — Interval H&P Note (Signed)
History and Physical Interval Note:  11/19/2020 11:49 AM  Paige Robertson  has presented today for surgery, with the diagnosis of UPPER GI BLEED.  The various methods of treatment have been discussed with the patient and family. After consideration of risks, benefits and other options for treatment, the patient has consented to  Procedure(s): ESOPHAGOGASTRODUODENOSCOPY (EGD) WITH PROPOFOL (N/A) as a surgical intervention.  The patient's history has been reviewed, patient examined, no change in status, stable for surgery.  I have reviewed the patient's chart and labs.  Questions were answered to the patient's satisfaction.     [employer], [place]

## 2020-11-19 NOTE — Transfer of Care (Signed)
Immediate Anesthesia Transfer of Care Note  Patient: Paige Robertson  Procedure(s) Performed: ESOPHAGOGASTRODUODENOSCOPY (EGD) WITH PROPOFOL (N/A )  Patient Location: PACU and Endoscopy Unit  Anesthesia Type:General  Level of Consciousness: drowsy  Airway & Oxygen Therapy: Patient Spontanous Breathing  Post-op Assessment: Report given to RN  Post vital signs: stable  Last Vitals:  Vitals Value Taken Time  BP    Temp    Pulse    Resp    SpO2      Last Pain:  Vitals:   11/19/20 1038  TempSrc: Temporal  PainSc: 0-No pain         Complications: No complications documented.

## 2020-11-20 ENCOUNTER — Encounter: Payer: Self-pay | Admitting: Internal Medicine

## 2020-11-26 ENCOUNTER — Inpatient Hospital Stay: Payer: BC Managed Care – PPO | Attending: Oncology

## 2020-11-26 DIAGNOSIS — D500 Iron deficiency anemia secondary to blood loss (chronic): Secondary | ICD-10-CM | POA: Diagnosis present

## 2020-11-26 DIAGNOSIS — K219 Gastro-esophageal reflux disease without esophagitis: Secondary | ICD-10-CM | POA: Insufficient documentation

## 2020-11-26 DIAGNOSIS — Z79899 Other long term (current) drug therapy: Secondary | ICD-10-CM | POA: Insufficient documentation

## 2020-11-26 DIAGNOSIS — E785 Hyperlipidemia, unspecified: Secondary | ICD-10-CM | POA: Insufficient documentation

## 2020-11-26 DIAGNOSIS — D509 Iron deficiency anemia, unspecified: Secondary | ICD-10-CM

## 2020-11-26 LAB — CBC WITH DIFFERENTIAL/PLATELET
Abs Immature Granulocytes: 0.01 10*3/uL (ref 0.00–0.07)
Basophils Absolute: 0 10*3/uL (ref 0.0–0.1)
Basophils Relative: 1 %
Eosinophils Absolute: 0 10*3/uL (ref 0.0–0.5)
Eosinophils Relative: 1 %
HCT: 30.9 % — ABNORMAL LOW (ref 36.0–46.0)
Hemoglobin: 9.9 g/dL — ABNORMAL LOW (ref 12.0–15.0)
Immature Granulocytes: 0 %
Lymphocytes Relative: 30 %
Lymphs Abs: 1.6 10*3/uL (ref 0.7–4.0)
MCH: 29.7 pg (ref 26.0–34.0)
MCHC: 32 g/dL (ref 30.0–36.0)
MCV: 92.8 fL (ref 80.0–100.0)
Monocytes Absolute: 0.3 10*3/uL (ref 0.1–1.0)
Monocytes Relative: 7 %
Neutro Abs: 3.2 10*3/uL (ref 1.7–7.7)
Neutrophils Relative %: 61 %
Platelets: 311 10*3/uL (ref 150–400)
RBC: 3.33 MIL/uL — ABNORMAL LOW (ref 3.87–5.11)
RDW: 14.1 % (ref 11.5–15.5)
WBC: 5.2 10*3/uL (ref 4.0–10.5)
nRBC: 0 % (ref 0.0–0.2)

## 2020-11-26 LAB — IRON AND TIBC
Iron: 18 ug/dL — ABNORMAL LOW (ref 28–170)
Saturation Ratios: 5 % — ABNORMAL LOW (ref 10.4–31.8)
TIBC: 368 ug/dL (ref 250–450)
UIBC: 350 ug/dL

## 2020-11-26 LAB — FERRITIN: Ferritin: 39 ng/mL (ref 11–307)

## 2020-11-27 ENCOUNTER — Encounter: Payer: Self-pay | Admitting: Oncology

## 2020-11-27 ENCOUNTER — Inpatient Hospital Stay (HOSPITAL_BASED_OUTPATIENT_CLINIC_OR_DEPARTMENT_OTHER): Payer: BC Managed Care – PPO | Admitting: Oncology

## 2020-11-27 DIAGNOSIS — I998 Other disorder of circulatory system: Secondary | ICD-10-CM

## 2020-11-27 DIAGNOSIS — D509 Iron deficiency anemia, unspecified: Secondary | ICD-10-CM

## 2020-11-27 DIAGNOSIS — D500 Iron deficiency anemia secondary to blood loss (chronic): Secondary | ICD-10-CM | POA: Diagnosis not present

## 2020-11-27 NOTE — Progress Notes (Signed)
HEMATOLOGY-ONCOLOGY TeleHEALTH VISIT PROGRESS NOTE  I connected with Paige Robertson on 11/27/20 at  2:00 PM EST by video enabled telemedicine visit and verified that I am speaking with the correct person using two identifiers. I discussed the limitations, risks, security and privacy concerns of performing an evaluation and management service by telemedicine and the availability of in-person appointments. I also discussed with the patient that there may be a patient responsible charge related to this service. The patient expressed understanding and agreed to proceed.   Other persons participating in the visit and their role in the encounter:  None  Patient's location: Home  Provider's location: office Chief Complaint: Iron deficiency anemia   PERTINENT HEMATOLOGY HISTORY Patient was admitted from 07/06/2020-07/09/2020 due to GI bleeding and melena. 07/06/2020 EGD found nonbleeding angioplastic lesion treated with APC.  Patient received PRBC transfusion during her hospitalization.  Patient has a capsule study 07/09/2020 which did not find any more bleeding lesions. Today she denies any additional episodes of melena, bloody stool.  Denies any pain.  INTERVAL HISTORY Paige Robertson is a 44 y.o. female who has above history reviewed by me today presents for follow up visit for management of iron deficiency Problems and complaints are listed below: I attempted to connect the patient for visual enabled telehealth visit.  Due to the technical difficulties with video,  Patient was transitioned to audio only visit. During the interval, patient had drop of hemoglobin again. 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 Patient denies any bloody or black stool since endoscopy + Fatigue   Review of Systems  Constitutional: Positive for fatigue. Negative for appetite change, chills and fever.  HENT:    Negative for hearing loss and voice change.   Eyes: Negative for eye problems.  Respiratory: Negative for chest tightness and cough.   Cardiovascular: Negative for chest pain.  Gastrointestinal: Negative for abdominal distention, abdominal pain and blood in stool.  Endocrine: Negative for hot flashes.  Genitourinary: Negative for difficulty urinating and frequency.   Musculoskeletal: Negative for arthralgias.  Skin: Negative for itching and rash.  Neurological: Negative for extremity weakness.  Hematological: Negative for adenopathy.  Psychiatric/Behavioral: Negative for confusion.    Past Medical History:  Diagnosis Date  . Acid reflux   . Hematuria   . Hyperlipemia   . IDA (iron deficiency anemia)   . Melena   . Perimenopausal symptoms   . Vaginal discharge    Past Surgical History:  Procedure Laterality Date  . COLONOSCOPY WITH ESOPHAGOGASTRODUODENOSCOPY (EGD)    . ENTEROSCOPY N/A 07/09/2020   Procedure: ENTEROSCOPY;  Surgeon: Toney Reil, MD;  Location: [facility] ENDOSCOPY;  Service: Gastroenterology;  Laterality: N/A;  . ESOPHAGOGASTRODUODENOSCOPY (EGD) WITH PROPOFOL N/A 07/06/2020   Procedure: ESOPHAGOGASTRODUODENOSCOPY (EGD) WITH PROPOFOL;  Surgeon: Midge Minium, MD;  Location: [facility] ENDOSCOPY;  Service: Endoscopy;  Laterality: N/A;  . ESOPHAGOGASTRODUODENOSCOPY (EGD) WITH PROPOFOL N/A 11/19/2020   Procedure: ESOPHAGOGASTRODUODENOSCOPY (EGD) WITH PROPOFOL;  Surgeon: Toledo, Boykin Nearing, MD;  Location: [facility] ENDOSCOPY;  Service: Gastroenterology;  Laterality: N/A;  . GIVENS CAPSULE STUDY N/A 07/08/2020   Procedure: GIVENS CAPSULE STUDY;  Surgeon: Pasty Spillers, MD;  Location: [facility] ENDOSCOPY;  Service: Endoscopy;  Laterality: N/A;  Administer between 6-8 pm today  . NO PAST SURGERIES      Family History  Problem Relation Age of Onset  . Diabetes Father   . Stroke Father   . Breast cancer Maternal Grandmother   .  Breast cancer Paternal Aunt   . High Cholesterol Mother    . Colon cancer Neg Hx   . Ovarian cancer Neg Hx   . Heart disease Neg Hx     Social History   Socioeconomic History  . Marital status: Married    Spouse name: Not on file  . Number of children: Not on file  . Years of education: Not on file  . Highest education level: Not on file  Occupational History  . Not on file  Tobacco Use  . Smoking status: Never Smoker  . Smokeless tobacco: Never Used  Vaping Use  . Vaping Use: Never used  Substance and Sexual Activity  . Alcohol use: No  . Drug use: No  . Sexual activity: Yes    Comment: vasectomy  Other Topics Concern  . Not on file  Social History Narrative  . Not on file   Social Determinants of Health   Financial Resource Strain: Not on file  Food Insecurity: Not on file  Transportation Needs: Not on file  Physical Activity: Not on file  Stress: Not on file  Social Connections: Not on file  Intimate Partner Violence: Not on file    Current Outpatient Medications on File Prior to Visit  Medication Sig Dispense Refill  . cetirizine (ZYRTEC) 10 MG tablet Take 10 mg by mouth daily.    . famotidine (PEPCID) 40 MG tablet Take by mouth.    . ferrous sulfate 325 (65 FE) MG tablet Take 1 tablet (325 mg total) by mouth daily with breakfast. Can get any over-the-counter iron supplement.  3  . Multiple Vitamins-Calcium (ONE-A-DAY WOMENS FORMULA) TABS Take 1 tablet by mouth daily.    . vitamin B-12 1000 MCG tablet Take 1 tablet (1,000 mcg total) by mouth daily. Can get any over-the-counter Vitamin B-12 supplement.     No current facility-administered medications on file prior to visit.    Allergies  Allergen Reactions  . Sulfa Antibiotics        Observations/Objective: There were no vitals filed for this visit. There is no height or weight on file to calculate BMI.  Physical Exam Neurological:     Mental Status: She is alert.     CBC    Component Value Date/Time   WBC 5.2 11/26/2020 1055   RBC 3.33 (L) 11/26/2020  1055   HGB 9.9 (L) 11/26/2020 1055   HGB 13.1 10/01/2017 1115   HCT 30.9 (L) 11/26/2020 1055   HCT 39.8 10/01/2017 1115   PLT 311 11/26/2020 1055   PLT 307 10/01/2017 1115   MCV 92.8 11/26/2020 1055   MCV 93 10/01/2017 1115   MCH 29.7 11/26/2020 1055   MCHC 32.0 11/26/2020 1055   RDW 14.1 11/26/2020 1055   RDW 13.7 10/01/2017 1115   LYMPHSABS 1.6 11/26/2020 1055   LYMPHSABS 2.1 10/01/2017 1115   MONOABS 0.3 11/26/2020 1055   EOSABS 0.0 11/26/2020 1055   EOSABS 0.1 10/01/2017 1115   BASOSABS 0.0 11/26/2020 1055   BASOSABS 0.0 10/01/2017 1115    CMP     Component Value Date/Time   NA 138 07/09/2020 0502   NA 141 10/01/2017 1115   K 3.7 07/09/2020 0502   CL 105 07/09/2020 0502   CO2 28 07/09/2020 0502   GLUCOSE 97 07/09/2020 0502   BUN 9 07/09/2020 0502   BUN 10 10/01/2017 1115   CREATININE 0.48 07/09/2020 0502   CALCIUM 8.8 (L) 07/09/2020 0502   PROT 5.7 (L) 07/07/2020 [id number]  PROT 7.0 10/01/2017 1115   ALBUMIN 3.5 07/07/2020 0749   ALBUMIN 4.4 10/01/2017 1115   AST 31 07/07/2020 0749   ALT 37 07/07/2020 0749   ALKPHOS 49 07/07/2020 0749   BILITOT 0.5 07/07/2020 0749   BILITOT 0.7 10/01/2017 1115   GFRNONAA >60 07/09/2020 0502   GFRAA >60 07/09/2020 0502     Assessment and Plan: 1. Angiectasia   2. 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 Labs reviewed and discussed with patient. Hemoglobin is 9.9, decreased from last visit.  Improved from nadir during this interval. Iron panel is consistent with iron deficiency Recommend patient to proceed with IV Venofer weekly x4.  #Recurrent gastric angiectasia. Cussed with patient that many conditions may increase her risk of developing angiectasia. Patient has no baseline chronic heart failure, no heart murmur I will check von Willebrand panel, coags   Follow Up Instructions: 3 months   I discussed the  assessment and treatment plan with the patient. The patient was provided an opportunity to ask questions and all were answered. The patient agreed with the plan and demonstrated an understanding of the instructions.  The patient was advised to call back or seek an in-person evaluation if the symptoms worsen or if the condition fails to improve as anticipated.    Earlie Server, MD 11/27/2020 9:18 PM

## 2020-11-27 NOTE — Progress Notes (Signed)
Pt saw GI doctor recently due to having black stools and had and emergent endoscopy last Monday.

## 2020-12-06 ENCOUNTER — Inpatient Hospital Stay: Payer: BC Managed Care – PPO

## 2020-12-06 ENCOUNTER — Inpatient Hospital Stay: Payer: BC Managed Care – PPO | Attending: Oncology

## 2020-12-06 VITALS — BP 95/60 | HR 67 | Temp 97.0°F | Resp 18

## 2020-12-06 DIAGNOSIS — D509 Iron deficiency anemia, unspecified: Secondary | ICD-10-CM | POA: Diagnosis not present

## 2020-12-06 DIAGNOSIS — I998 Other disorder of circulatory system: Secondary | ICD-10-CM

## 2020-12-06 LAB — PROTIME-INR
INR: 1 (ref 0.8–1.2)
Prothrombin Time: 12.3 seconds (ref 11.4–15.2)

## 2020-12-06 LAB — APTT: aPTT: 29 seconds (ref 24–36)

## 2020-12-06 MED ORDER — IRON SUCROSE 20 MG/ML IV SOLN
200.0000 mg | Freq: Once | INTRAVENOUS | Status: AC
  Administered 2020-12-06: 200 mg via INTRAVENOUS
  Filled 2020-12-06: qty 10

## 2020-12-06 MED ORDER — SODIUM CHLORIDE 0.9 % IV SOLN
Freq: Once | INTRAVENOUS | Status: AC
  Filled 2020-12-06: qty 250

## 2020-12-06 NOTE — Progress Notes (Signed)
Pt tolerated infusion well with no signs of complications. VSS. Pt stable for discharge.   Paige Robertson  

## 2020-12-07 LAB — VON WILLEBRAND PANEL
Coagulation Factor VIII: 162 % — ABNORMAL HIGH (ref 56–140)
Ristocetin Co-factor, Plasma: 108 % (ref 50–200)
Von Willebrand Antigen, Plasma: 104 % (ref 50–200)

## 2020-12-07 LAB — COAG STUDIES INTERP REPORT

## 2020-12-13 ENCOUNTER — Inpatient Hospital Stay: Payer: BC Managed Care – PPO

## 2020-12-13 ENCOUNTER — Other Ambulatory Visit: Payer: Self-pay

## 2020-12-13 VITALS — BP 98/60 | HR 68 | Temp 97.7°F | Resp 16

## 2020-12-13 DIAGNOSIS — D509 Iron deficiency anemia, unspecified: Secondary | ICD-10-CM

## 2020-12-13 MED ORDER — IRON SUCROSE 20 MG/ML IV SOLN
200.0000 mg | Freq: Once | INTRAVENOUS | Status: AC
  Administered 2020-12-13: 200 mg via INTRAVENOUS
  Filled 2020-12-13: qty 10

## 2020-12-13 MED ORDER — SODIUM CHLORIDE 0.9 % IV SOLN
Freq: Once | INTRAVENOUS | Status: AC
  Filled 2020-12-13: qty 250

## 2020-12-13 NOTE — Progress Notes (Signed)
Patient tolerate infusion well. Patient and VSS. Discharged home.

## 2020-12-21 ENCOUNTER — Inpatient Hospital Stay: Payer: BC Managed Care – PPO

## 2020-12-28 ENCOUNTER — Inpatient Hospital Stay: Payer: BC Managed Care – PPO

## 2020-12-28 VITALS — BP 99/62 | HR 71 | Temp 99.3°F | Resp 16

## 2020-12-28 DIAGNOSIS — D509 Iron deficiency anemia, unspecified: Secondary | ICD-10-CM | POA: Diagnosis not present

## 2020-12-28 MED ORDER — IRON SUCROSE 20 MG/ML IV SOLN
200.0000 mg | Freq: Once | INTRAVENOUS | Status: AC
  Administered 2020-12-28: 200 mg via INTRAVENOUS
  Filled 2020-12-28: qty 10

## 2020-12-28 MED ORDER — SODIUM CHLORIDE 0.9 % IV SOLN
Freq: Once | INTRAVENOUS | Status: AC
  Filled 2020-12-28: qty 250

## 2020-12-28 NOTE — Progress Notes (Signed)
Patient tolerated infusion well. Patient and VSS. Discharged home  

## 2021-01-04 ENCOUNTER — Inpatient Hospital Stay: Payer: BC Managed Care – PPO | Attending: Oncology

## 2021-01-04 VITALS — BP 103/67 | HR 72 | Resp 18

## 2021-01-04 DIAGNOSIS — D509 Iron deficiency anemia, unspecified: Secondary | ICD-10-CM | POA: Diagnosis present

## 2021-01-04 DIAGNOSIS — D508 Other iron deficiency anemias: Secondary | ICD-10-CM

## 2021-01-04 MED ORDER — SODIUM CHLORIDE 0.9 % IV SOLN
INTRAVENOUS | Status: DC
  Filled 2021-01-04: qty 250

## 2021-01-04 MED ORDER — IRON SUCROSE 20 MG/ML IV SOLN
200.0000 mg | Freq: Once | INTRAVENOUS | Status: AC
  Administered 2021-01-04: 200 mg via INTRAVENOUS
  Filled 2021-01-04: qty 10

## 2021-01-04 NOTE — Progress Notes (Signed)
Pt received prescribed treatment in clinic, pt stable at d/c. 

## 2021-01-05 IMAGING — MG MM DIGITAL DIAGNOSTIC UNILAT*R* W/ TOMO W/ CAD
4 series · 4 of 12 positions shown · non-contrast
Comparison: Previous exam(s).

CLINICAL DATA: Recall from screening to evaluate a possible right
breast asymmetry.

EXAM:
DIGITAL DIAGNOSTIC UNILATERAL RIGHT MAMMOGRAM WITH CAD AND TOMO

[R CC synth-2D]
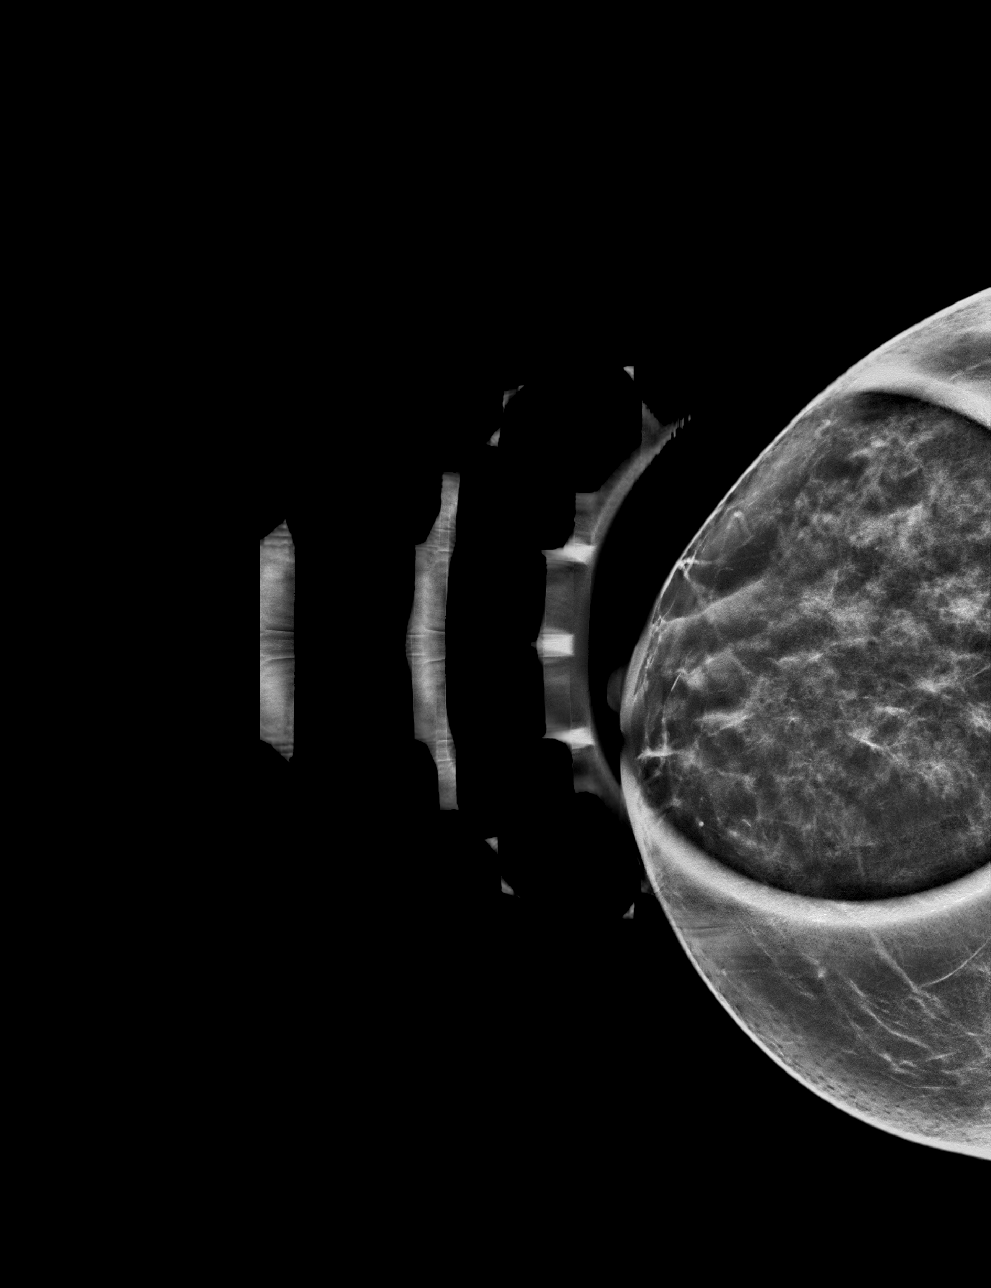

[R MLO synth-2D]
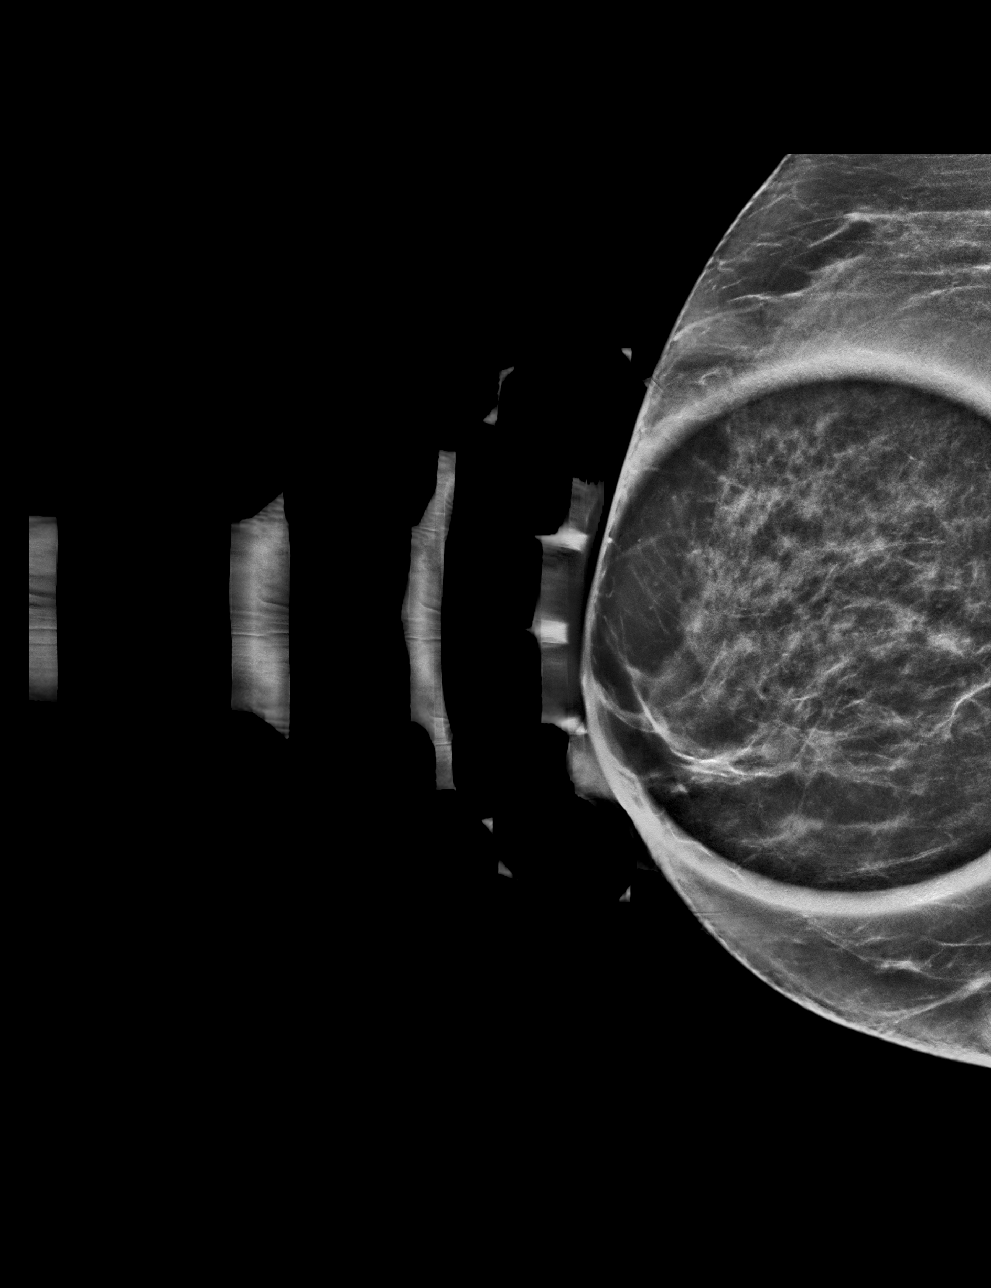

[R MLO tomo · tomo slice 33/66.0]
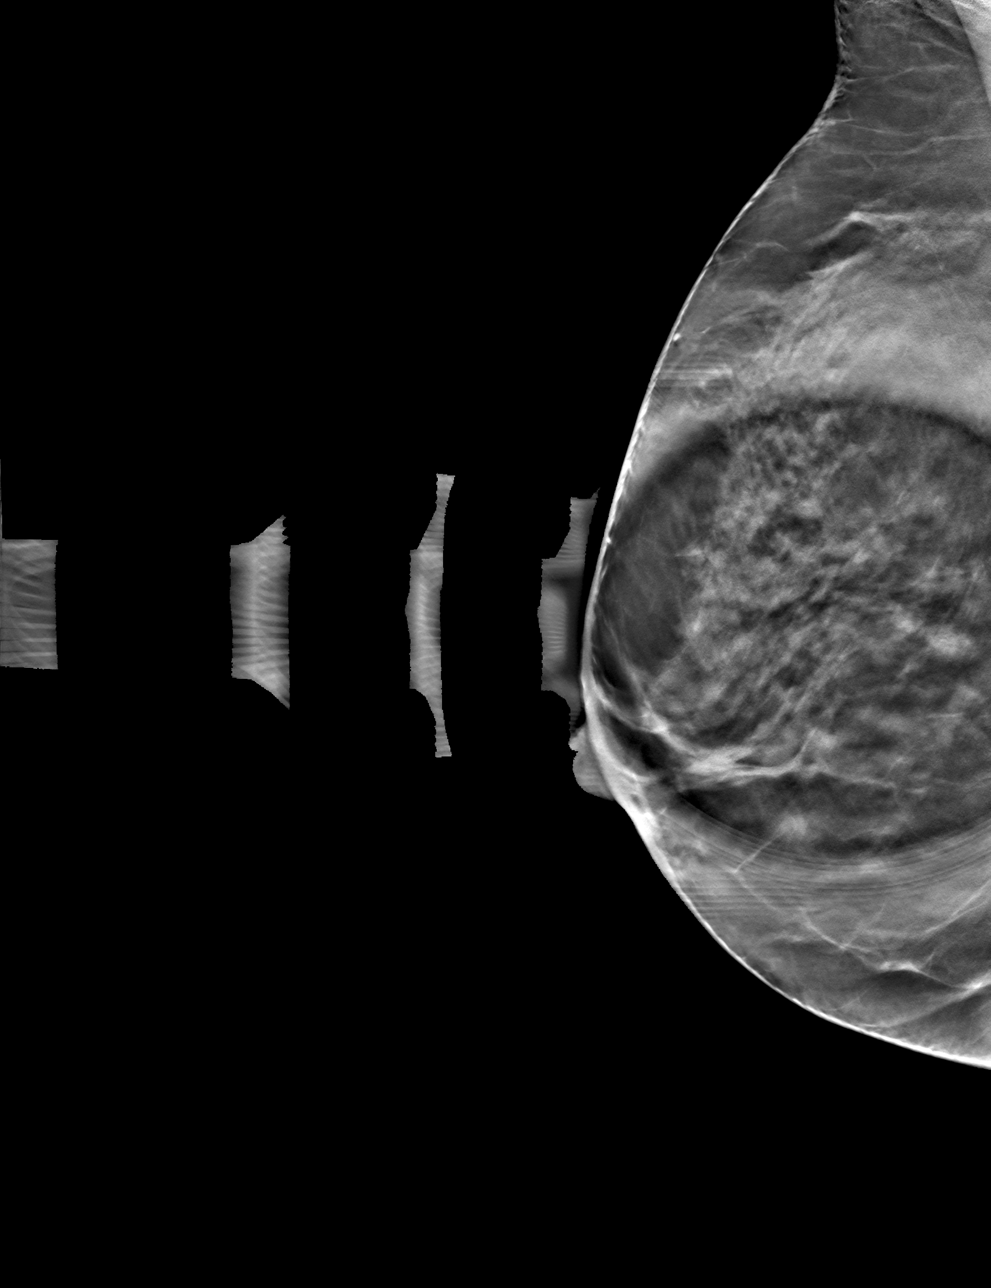

[R CC tomo · tomo slice 33/64.0]
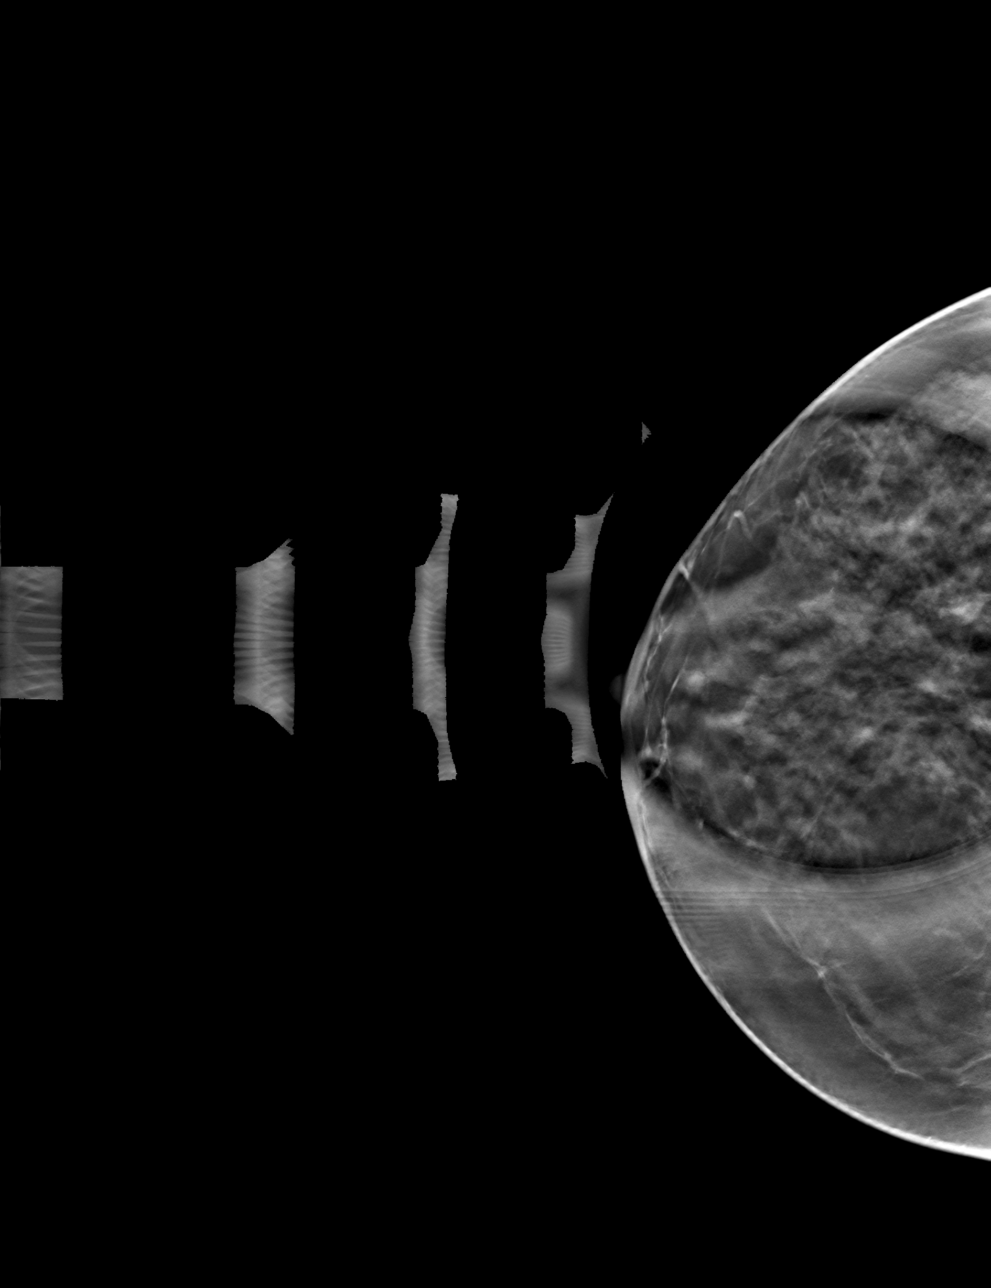

[4 of 12 positions shown; findings below may reference images not displayed]

ACR Breast Density Category c: The breast tissue is heterogeneously
dense, which may obscure small masses.
FINDINGS: Spot compression tomographic images demonstrate no focal abnormality
over the outer mid to upper right periareolar region as the
screening finding is due to overlapping dense fibroglandular tissue.

Mammographic images were processed with CAD.
IMPRESSION: No focal abnormality over the outer mid to upper right periareolar
region.

RECOMMENDATION:
Recommend continued annual bilateral screening mammographic
follow-up.

I have discussed the findings and recommendations with the patient.
If applicable, a reminder letter will be sent to the patient
regarding the next appointment.

BI-RADS CATEGORY  1: Negative.

## 2021-02-07 ENCOUNTER — Observation Stay: Payer: BC Managed Care – PPO | Admitting: Anesthesiology

## 2021-02-07 ENCOUNTER — Inpatient Hospital Stay
Admission: EM | Admit: 2021-02-07 | Discharge: 2021-02-09 | DRG: 378 | Disposition: A | Discharge status: Home / Self Care | Payer: BC Managed Care – PPO | Source: Ambulatory Visit | Attending: Internal Medicine | Admitting: Internal Medicine

## 2021-02-07 ENCOUNTER — Other Ambulatory Visit: Payer: Self-pay

## 2021-02-07 ENCOUNTER — Encounter
Admission: EM | Disposition: A | Discharge status: Home / Self Care | Payer: Self-pay | Source: Ambulatory Visit | Attending: Internal Medicine

## 2021-02-07 ENCOUNTER — Encounter: Payer: Self-pay | Admitting: Emergency Medicine

## 2021-02-07 DIAGNOSIS — Z888 Allergy status to other drugs, medicaments and biological substances status: Secondary | ICD-10-CM

## 2021-02-07 DIAGNOSIS — K31811 Angiodysplasia of stomach and duodenum with bleeding: Principal | ICD-10-CM | POA: Diagnosis present

## 2021-02-07 DIAGNOSIS — R42 Dizziness and giddiness: Secondary | ICD-10-CM

## 2021-02-07 DIAGNOSIS — I959 Hypotension, unspecified: Secondary | ICD-10-CM | POA: Diagnosis present

## 2021-02-07 DIAGNOSIS — Z803 Family history of malignant neoplasm of breast: Secondary | ICD-10-CM

## 2021-02-07 DIAGNOSIS — D72829 Elevated white blood cell count, unspecified: Secondary | ICD-10-CM

## 2021-02-07 DIAGNOSIS — D509 Iron deficiency anemia, unspecified: Secondary | ICD-10-CM

## 2021-02-07 DIAGNOSIS — D649 Anemia, unspecified: Secondary | ICD-10-CM | POA: Diagnosis not present

## 2021-02-07 DIAGNOSIS — D62 Acute posthemorrhagic anemia: Secondary | ICD-10-CM | POA: Diagnosis not present

## 2021-02-07 DIAGNOSIS — D500 Iron deficiency anemia secondary to blood loss (chronic): Secondary | ICD-10-CM | POA: Diagnosis not present

## 2021-02-07 DIAGNOSIS — Z833 Family history of diabetes mellitus: Secondary | ICD-10-CM

## 2021-02-07 DIAGNOSIS — Z882 Allergy status to sulfonamides status: Secondary | ICD-10-CM

## 2021-02-07 DIAGNOSIS — Z79899 Other long term (current) drug therapy: Secondary | ICD-10-CM

## 2021-02-07 DIAGNOSIS — Z20822 Contact with and (suspected) exposure to covid-19: Secondary | ICD-10-CM | POA: Diagnosis present

## 2021-02-07 DIAGNOSIS — K5521 Angiodysplasia of colon with hemorrhage: Secondary | ICD-10-CM

## 2021-02-07 DIAGNOSIS — K922 Gastrointestinal hemorrhage, unspecified: Secondary | ICD-10-CM | POA: Diagnosis present

## 2021-02-07 DIAGNOSIS — K219 Gastro-esophageal reflux disease without esophagitis: Secondary | ICD-10-CM | POA: Diagnosis present

## 2021-02-07 DIAGNOSIS — K31819 Angiodysplasia of stomach and duodenum without bleeding: Secondary | ICD-10-CM | POA: Diagnosis not present

## 2021-02-07 DIAGNOSIS — Z823 Family history of stroke: Secondary | ICD-10-CM

## 2021-02-07 DIAGNOSIS — Z83438 Family history of other disorder of lipoprotein metabolism and other lipidemia: Secondary | ICD-10-CM

## 2021-02-07 HISTORY — PX: ENTEROSCOPY: SHX5533

## 2021-02-07 LAB — CBC
HCT: 26.2 % — ABNORMAL LOW (ref 36.0–46.0)
HCT: 25 % — ABNORMAL LOW (ref 36.0–46.0)
HCT: 21.3 % — ABNORMAL LOW (ref 36.0–46.0)
Hemoglobin: 8.6 g/dL — ABNORMAL LOW (ref 12.0–15.0)
Hemoglobin: 8.2 g/dL — ABNORMAL LOW (ref 12.0–15.0)
Hemoglobin: 7 g/dL — ABNORMAL LOW (ref 12.0–15.0)
MCH: 30.2 pg (ref 26.0–34.0)
MCH: 29.3 pg (ref 26.0–34.0)
MCH: 29.7 pg (ref 26.0–34.0)
MCHC: 32.8 g/dL (ref 30.0–36.0)
MCHC: 32.8 g/dL (ref 30.0–36.0)
MCHC: 32.9 g/dL (ref 30.0–36.0)
MCV: 91.9 fL (ref 80.0–100.0)
MCV: 89.3 fL (ref 80.0–100.0)
MCV: 90.3 fL (ref 80.0–100.0)
Platelets: 281 10*3/uL (ref 150–400)
Platelets: 185 10*3/uL (ref 150–400)
Platelets: 143 10*3/uL — ABNORMAL LOW (ref 150–400)
RBC: 2.85 MIL/uL — ABNORMAL LOW (ref 3.87–5.11)
RBC: 2.8 MIL/uL — ABNORMAL LOW (ref 3.87–5.11)
RBC: 2.36 MIL/uL — ABNORMAL LOW (ref 3.87–5.11)
RDW: 16.1 % — ABNORMAL HIGH (ref 11.5–15.5)
RDW: 18 % — ABNORMAL HIGH (ref 11.5–15.5)
RDW: 18.6 % — ABNORMAL HIGH (ref 11.5–15.5)
WBC: 12.9 10*3/uL — ABNORMAL HIGH (ref 4.0–10.5)
WBC: 10.3 10*3/uL (ref 4.0–10.5)
WBC: 10.2 10*3/uL (ref 4.0–10.5)
nRBC: 0 % (ref 0.0–0.2)
nRBC: 0 % (ref 0.0–0.2)
nRBC: 0 % (ref 0.0–0.2)

## 2021-02-07 LAB — URINALYSIS, COMPLETE (UACMP) WITH MICROSCOPIC
Bacteria, UA: NONE SEEN
Bilirubin Urine: NEGATIVE
Glucose, UA: NEGATIVE mg/dL
Hgb urine dipstick: NEGATIVE
Ketones, ur: NEGATIVE mg/dL
Nitrite: NEGATIVE
Protein, ur: NEGATIVE mg/dL
Specific Gravity, Urine: 1.025 (ref 1.005–1.030)
pH: 5 (ref 5.0–8.0)

## 2021-02-07 LAB — COMPREHENSIVE METABOLIC PANEL
ALT: 51 U/L — ABNORMAL HIGH (ref 0–44)
AST: 35 U/L (ref 15–41)
Albumin: 4.3 g/dL (ref 3.5–5.0)
Alkaline Phosphatase: 56 U/L (ref 38–126)
Anion gap: 8 (ref 5–15)
BUN: 24 mg/dL — ABNORMAL HIGH (ref 6–20)
CO2: 23 mmol/L (ref 22–32)
Calcium: 9.2 mg/dL (ref 8.9–10.3)
Chloride: 105 mmol/L (ref 98–111)
Creatinine, Ser: 0.58 mg/dL (ref 0.44–1.00)
GFR, Estimated: >60 mL/min (ref >60)
Glucose, Bld: 161 mg/dL — ABNORMAL HIGH (ref 70–99)
Potassium: 3.7 mmol/L (ref 3.5–5.1)
Sodium: 136 mmol/L (ref 135–145)
Total Bilirubin: 0.4 mg/dL (ref 0.3–1.2)
Total Protein: 7 g/dL (ref 6.5–8.1)

## 2021-02-07 LAB — PROTIME-INR
INR: 1 (ref 0.8–1.2)
Prothrombin Time: 13.1 seconds (ref 11.4–15.2)

## 2021-02-07 LAB — FERRITIN: Ferritin: 50 ng/mL (ref 11–307)

## 2021-02-07 LAB — RESP PANEL BY RT-PCR (FLU A&B, COVID) ARPGX2
Influenza A by PCR: NEGATIVE
Influenza B by PCR: NEGATIVE
SARS Coronavirus 2 by RT PCR: NEGATIVE

## 2021-02-07 LAB — TROPONIN I (HIGH SENSITIVITY): Troponin I (High Sensitivity): <2 ng/L (ref <18)

## 2021-02-07 LAB — VITAMIN B12: Vitamin B-12: 246 pg/mL (ref 180–914)

## 2021-02-07 LAB — GLUCOSE, CAPILLARY: Glucose-Capillary: 145 mg/dL — ABNORMAL HIGH (ref 70–99)

## 2021-02-07 LAB — IRON AND TIBC
Iron: 91 ug/dL (ref 28–170)
Saturation Ratios: 24 % (ref 10.4–31.8)
TIBC: 382 ug/dL (ref 250–450)
UIBC: 291 ug/dL

## 2021-02-07 LAB — PREGNANCY, URINE: Preg Test, Ur: NEGATIVE

## 2021-02-07 LAB — FOLATE: Folate: 13.5 ng/mL (ref >5.9)

## 2021-02-07 LAB — PREPARE RBC (CROSSMATCH)

## 2021-02-07 LAB — APTT: aPTT: 26 seconds (ref 24–36)

## 2021-02-07 SURGERY — ENTEROSCOPY
Anesthesia: General

## 2021-02-07 MED ORDER — MEPERIDINE HCL 25 MG/ML IJ SOLN
25.0000 mg | Freq: Once | INTRAMUSCULAR | Status: AC
  Administered 2021-02-07: 25 mg via INTRAVENOUS
  Filled 2021-02-07: qty 1

## 2021-02-07 MED ORDER — METHYLPREDNISOLONE SODIUM SUCC 125 MG IJ SOLR: 125.0000 mg | Freq: Once | INTRAMUSCULAR | Status: AC

## 2021-02-07 MED ORDER — VITAMIN B-12 1000 MCG PO TABS
1000.0000 ug | ORAL_TABLET | Freq: Every day | ORAL | Status: DC
  Administered 2021-02-08 – 2021-02-09 (×2): 1000 ug via ORAL
  Filled 2021-02-07 (×3): qty 1

## 2021-02-07 MED ORDER — DIPHENHYDRAMINE HCL 50 MG/ML IJ SOLN
25.0000 mg | Freq: Once | INTRAMUSCULAR | Status: AC
  Administered 2021-02-07: 25 mg via INTRAVENOUS

## 2021-02-07 MED ORDER — SODIUM CHLORIDE 0.9 % IV SOLN
10.0000 mL/h | Freq: Once | INTRAVENOUS | Status: AC
  Administered 2021-02-07: 10 mL/h via INTRAVENOUS

## 2021-02-07 MED ORDER — PROPOFOL 10 MG/ML IV BOLUS
INTRAVENOUS | Status: DC | PRN
  Administered 2021-02-07: 70 mg via INTRAVENOUS

## 2021-02-07 MED ORDER — PROPOFOL 500 MG/50ML IV EMUL
INTRAVENOUS | Status: AC
  Filled 2021-02-07: qty 50

## 2021-02-07 MED ORDER — EPINEPHRINE 1 MG/10ML IJ SOSY
PREFILLED_SYRINGE | INTRAMUSCULAR | Status: AC
  Filled 2021-02-07: qty 10

## 2021-02-07 MED ORDER — DEXMEDETOMIDINE HCL 200 MCG/2ML IV SOLN
INTRAVENOUS | Status: DC | PRN
  Administered 2021-02-07: 8 ug via INTRAVENOUS
  Administered 2021-02-07: 12 ug via INTRAVENOUS

## 2021-02-07 MED ORDER — EPINEPHRINE 1 MG/10ML IJ SOSY
PREFILLED_SYRINGE | INTRAMUSCULAR | Status: DC | PRN
  Administered 2021-02-07: 0.3 mg via SUBCUTANEOUS

## 2021-02-07 MED ORDER — VITAMIN B-12 1000 MCG PO TABS: 1000.0000 ug | ORAL_TABLET | Freq: Every day | ORAL | Status: DC

## 2021-02-07 MED ORDER — HYDROXYZINE HCL 50 MG/ML IM SOLN
25.0000 mg | Freq: Four times a day (QID) | INTRAMUSCULAR | Status: DC | PRN
  Filled 2021-02-07: qty 0.5

## 2021-02-07 MED ORDER — FENTANYL CITRATE (PF) 100 MCG/2ML IJ SOLN: 25.0000 ug | Freq: Once | INTRAMUSCULAR | Status: AC

## 2021-02-07 MED ORDER — LORATADINE 10 MG PO TABS
10.0000 mg | ORAL_TABLET | Freq: Every day | ORAL | Status: DC
  Administered 2021-02-08 – 2021-02-09 (×2): 10 mg via ORAL
  Filled 2021-02-07 (×2): qty 1

## 2021-02-07 MED ORDER — METHYLPREDNISOLONE SODIUM SUCC 40 MG IJ SOLR
40.0000 mg | Freq: Two times a day (BID) | INTRAMUSCULAR | Status: DC
  Administered 2021-02-08: 40 mg via INTRAVENOUS
  Filled 2021-02-07: qty 1

## 2021-02-07 MED ORDER — FENTANYL CITRATE (PF) 100 MCG/2ML IJ SOLN
INTRAMUSCULAR | Status: AC
  Administered 2021-02-07: 25 ug via INTRAVENOUS
  Filled 2021-02-07: qty 2

## 2021-02-07 MED ORDER — ONE-A-DAY WOMENS FORMULA PO TABS: 1.0000 | ORAL_TABLET | Freq: Every day | ORAL | Status: DC

## 2021-02-07 MED ORDER — DIPHENHYDRAMINE HCL 50 MG/ML IJ SOLN
25.0000 mg | Freq: Two times a day (BID) | INTRAMUSCULAR | Status: DC
  Administered 2021-02-08: 25 mg via INTRAVENOUS
  Filled 2021-02-07: qty 1

## 2021-02-07 MED ORDER — SODIUM CHLORIDE 0.9 % IV BOLUS
1000.0000 mL | Freq: Once | INTRAVENOUS | Status: AC
  Administered 2021-02-07: 1000 mL via INTRAVENOUS

## 2021-02-07 MED ORDER — DIPHENHYDRAMINE HCL 50 MG/ML IJ SOLN
INTRAMUSCULAR | Status: AC
  Filled 2021-02-07: qty 1

## 2021-02-07 MED ORDER — LIDOCAINE HCL (CARDIAC) PF 100 MG/5ML IV SOSY
PREFILLED_SYRINGE | INTRAVENOUS | Status: DC | PRN
  Administered 2021-02-07: 100 mg via INTRAVENOUS

## 2021-02-07 MED ORDER — SODIUM CHLORIDE 0.9 % IV SOLN
80.0000 mg | Freq: Once | INTRAVENOUS | Status: AC
  Administered 2021-02-07: 11:00:00 80 mg via INTRAVENOUS
  Filled 2021-02-07: qty 80

## 2021-02-07 MED ORDER — SODIUM CHLORIDE 0.9 % IV SOLN
8.0000 mg/h | INTRAVENOUS | Status: DC
  Administered 2021-02-07 – 2021-02-09 (×5): 8 mg/h via INTRAVENOUS
  Filled 2021-02-07 (×4): qty 80

## 2021-02-07 MED ORDER — EPINEPHRINE 0.3 MG/0.3ML IJ SOAJ
0.3000 mg | INTRAMUSCULAR | Status: DC | PRN
  Filled 2021-02-07: qty 0.3

## 2021-02-07 MED ORDER — SODIUM CHLORIDE 0.9 % IV SOLN: INTRAVENOUS | Status: DC

## 2021-02-07 MED ORDER — FERROUS SULFATE 325 (65 FE) MG PO TABS
325.0000 mg | ORAL_TABLET | Freq: Every day | ORAL | Status: DC
  Administered 2021-02-08 – 2021-02-09 (×2): 325 mg via ORAL
  Filled 2021-02-07 (×2): qty 1

## 2021-02-07 MED ORDER — SODIUM CHLORIDE 0.9 % IV SOLN
INTRAVENOUS | Status: DC
  Administered 2021-02-07: 1000 mL via INTRAVENOUS

## 2021-02-07 MED ORDER — ACETAMINOPHEN 325 MG PO TABS
650.0000 mg | ORAL_TABLET | Freq: Four times a day (QID) | ORAL | Status: DC | PRN
  Administered 2021-02-08: 650 mg via ORAL
  Filled 2021-02-07: qty 2

## 2021-02-07 MED ORDER — SODIUM CHLORIDE 0.9 % IV BOLUS
500.0000 mL | Freq: Once | INTRAVENOUS | Status: AC
  Administered 2021-02-07: 500 mL via INTRAVENOUS

## 2021-02-07 MED ORDER — PROPOFOL 500 MG/50ML IV EMUL
INTRAVENOUS | Status: DC | PRN
  Administered 2021-02-07: 140 ug/kg/min via INTRAVENOUS

## 2021-02-07 MED ORDER — LIDOCAINE HCL (PF) 2 % IJ SOLN
INTRAMUSCULAR | Status: AC
  Filled 2021-02-07: qty 5

## 2021-02-07 MED ORDER — ADULT MULTIVITAMIN W/MINERALS CH
1.0000 | ORAL_TABLET | Freq: Every day | ORAL | Status: DC
  Administered 2021-02-08 – 2021-02-09 (×2): 1 via ORAL
  Filled 2021-02-07 (×2): qty 1

## 2021-02-07 MED ORDER — SODIUM CHLORIDE 0.9 % IV SOLN
510.0000 mg | Freq: Once | INTRAVENOUS | Status: AC
  Administered 2021-02-07: 510 mg via INTRAVENOUS
  Filled 2021-02-07: qty 17

## 2021-02-07 MED ORDER — FAMOTIDINE IN NACL 20-0.9 MG/50ML-% IV SOLN
20.0000 mg | Freq: Once | INTRAVENOUS | Status: AC
  Administered 2021-02-07: 20 mg via INTRAVENOUS
  Filled 2021-02-07: qty 50

## 2021-02-07 MED ORDER — FAMOTIDINE IN NACL 20-0.9 MG/50ML-% IV SOLN
20.0000 mg | Freq: Two times a day (BID) | INTRAVENOUS | Status: DC
  Administered 2021-02-08: 20 mg via INTRAVENOUS
  Filled 2021-02-07 (×2): qty 50

## 2021-02-07 MED ORDER — METHYLPREDNISOLONE SODIUM SUCC 125 MG IJ SOLR
INTRAMUSCULAR | Status: AC
  Administered 2021-02-07: 125 mg via INTRAVENOUS
  Filled 2021-02-07: qty 2

## 2021-02-07 NOTE — Transfer of Care (Signed)
Immediate Anesthesia Transfer of Care Note  Patient: Paige Robertson  Procedure(s) Performed: ENTEROSCOPY (N/A )  Patient Location: PACU  Anesthesia Type:General  Level of Consciousness: awake, alert  and oriented  Airway & Oxygen Therapy: Patient Spontanous Breathing  Post-op Assessment: Report given to RN and Post -op Vital signs reviewed and stable  Post vital signs: Reviewed and stable  Last Vitals:  Vitals Value Taken Time  BP 111/99 02/07/21 1552  Temp    Pulse 94 02/07/21 1552  Resp 11 02/07/21 1552  SpO2 100 % 02/07/21 1552  Vitals shown include unvalidated device data.  Last Pain:  Vitals:   02/07/21 1445  TempSrc:   PainSc: 0-No pain         Complications: No complications documented.

## 2021-02-07 NOTE — ED Notes (Signed)
Unit of blood initiated at 1135 @  150cc/hr.  First 15 minutes complete.  Pt tolerated well.  No s/sx of reaction.  Will continue to monitor.

## 2021-02-07 NOTE — Op Note (Addendum)
[facility] Patient Name: Paige Robertson Procedure Date: 02/07/2021 3:02 PM MRN: [id number] Account #: [id number] Date of Birth: 1976/05/10 Admit Type: Inpatient Age: 45 Room: [facility] ENDO ROOM 4 Gender: Female Note Status: Finalized Procedure:             Small bowel enteroscopy Indications:           Melena, GI bleeding source not documented by previous                         EGD and colonoscopy, Recurrent bleeding in the small                         bowel, Arteriovenous malformation in the small                         intestine Providers:             Lin Landsman MD, MD Referring MD:          Gladstone Lighter, MD (Referring MD) Medicines:             General Anesthesia Complications:         No immediate complications. Estimated blood loss:                         Minimal. Procedure:             Pre-Anesthesia Assessment:                        - Prior to the procedure, a History and Physical was                         performed, and patient medications and allergies were                         reviewed. The patient is competent. The risks and                         benefits of the procedure and the sedation options and                         risks were discussed with the patient. All questions                         were answered and informed consent was obtained.                         Patient identification and proposed procedure were                         verified by the physician, the nurse, the                         anesthesiologist, the anesthetist and the technician                         in the pre-procedure area in the procedure room in the  endoscopy suite. Mental Status Examination: alert and                         oriented. Airway Examination: normal oropharyngeal                         airway and neck mobility. Respiratory Examination:                         clear to auscultation. CV  Examination: normal.                         Prophylactic Antibiotics: The patient does not require                         prophylactic antibiotics. Prior Anticoagulants: The                         patient has taken no previous anticoagulant or                         antiplatelet agents. ASA Grade Assessment: II - A                         patient with mild systemic disease. After reviewing                         the risks and benefits, the patient was deemed in                         satisfactory condition to undergo the procedure. The                         anesthesia plan was to use general anesthesia.                         Immediately prior to administration of medications,                         the patient was re-assessed for adequacy to receive                         sedatives. The heart rate, respiratory rate, oxygen                         saturations, blood pressure, adequacy of pulmonary                         ventilation, and response to care were monitored                         throughout the procedure. The physical status of the                         patient was re-assessed after the procedure.                        After obtaining informed consent, the endoscope was  passed under direct vision. Throughout the procedure,                         the patient's blood pressure, pulse, and oxygen                         saturations were monitored continuously. The                         Colonoscope was introduced through the mouth and                         advanced to the proximal jejunum. The small bowel                         enteroscopy was accomplished without difficulty. The                         patient tolerated the procedure well. Findings:      Pediatric colonoscope was used to perform push enteroscopy. Proximal to       mid jejunum was reached, extent reached was marked with tattoo. While       withdrawing the scope, at least  3 diminutive AVMs were identified which       were oozing, these were treated successfully with APC. However, one       medium size AVM noticed to have pulsatile bleeding after treating it       with APC, the area was therefore injected with 3 mL epinephrine which       did not stop the bleeding. A clip was placed on the pulsatile blood       vessel. There was no bleeding after the hemostatic clip placement. This       area was marked with an ink      A few angiodysplastic lesions with bleeding were found in the proximal       jejunum. Coagulation for hemostasis using argon plasma was successful.      Another medium size AVM, Area was unsuccessfully injected with 3 mL of a       1:10,000 solution of epinephrine for hemostasis. For hemostasis, one       hemostatic clip was successfully placed. There was no bleeding at the       end of the procedure. Estimated blood loss was minimal. Area was       tattooed with an injection of Spot (carbon black).      There was no evidence of significant pathology in the entire examined       duodenum.      The esophagus was normal.      The stomach was normal. Impression:            - A few bleeding angiodysplastic lesions in the                         duodenum. Treatment successful. Treated with argon                         plasma coagulation (APC). Clip was placed. Treatment                         successful. Tattooed.                        -  Normal examined duodenum.                        - Normal esophagus.                        - Normal stomach.                        - No specimens collected. Recommendation:        - Return patient to hospital ward for ongoing care.                        - Clear liquid diet today.                        - CBC every 8hrs, transfuse to maintain Hb >7                        - IV iron                        - PPI BID Procedure Code(s):     --- Professional ---                        [phone], Small intestinal  endoscopy, enteroscopy beyond                         second portion of duodenum, not including ileum; with                         control of bleeding (eg, injection, bipolar cautery,                         unipolar cautery, laser, heater probe, stapler, plasma                         coagulator)                        44799, Unlisted procedure, small intestine Diagnosis Code(s):     --- Professional ---                        K31.811, Angiodysplasia of stomach and duodenum with                         bleeding                        K92.1, Melena (includes Hematochezia)                        K92.2, Gastrointestinal hemorrhage, unspecified CPT copyright 2019 [employer]. All rights reserved. The codes documented in this report are preliminary and upon coder review may  be revised to meet current compliance requirements. Dr. Ulyess Mort Lin Landsman MD, MD 02/07/2021 4:38:27 PM This report has been signed electronically. Number of Addenda: 0 Note Initiated On: 02/07/2021 3:02 PM Estimated Blood Loss:  Estimated blood loss: 50 mL.      [facility]

## 2021-02-07 NOTE — ED Provider Notes (Signed)
[facility] Emergency Department Provider Note  Time seen: 8:58 AM  I have reviewed the triage vital signs and the nursing notes.   HISTORY  Chief Complaint Dizziness   HPI Paige Robertson is a 45 y.o. female with a past medical history of gastric reflux, hyperlipidemia, iron deficiency anemia, prior upper GI bleed, presents to the emergency department for low blood count.  According to the patient over the past several days she has begun feeling weak and dizzy at times.  Had lab work performed at her doctor and was told that the hemoglobin has dropped to 8.4.  Per record review this is nearly a four-point drop in less than 1 month.  Patient denies any recent black or bloody stool.  Denies any bloody vomitus.  Denies any heavy vaginal bleeding.   Past Medical History:  Diagnosis Date  . Acid reflux   . Hematuria   . Hyperlipemia   . IDA (iron deficiency anemia)   . Melena   . Perimenopausal symptoms   . Vaginal discharge     Patient Active Problem List   Diagnosis Date Noted  . GI bleed 07/06/2020  . Acid reflux   . Angiodysplasia of duodenum   . Iron deficiency anemia 01/05/2020  . Hypopigmentation 05/26/2018  . SUI (stress urinary incontinence, female) 05/06/2017    Past Surgical History:  Procedure Laterality Date  . COLONOSCOPY WITH ESOPHAGOGASTRODUODENOSCOPY (EGD)    . ENTEROSCOPY N/A 07/09/2020   Procedure: ENTEROSCOPY;  Surgeon: Lin Landsman, MD;  Location: [facility] ENDOSCOPY;  Service: Gastroenterology;  Laterality: N/A;  . ESOPHAGOGASTRODUODENOSCOPY (EGD) WITH PROPOFOL N/A 07/06/2020   Procedure: ESOPHAGOGASTRODUODENOSCOPY (EGD) WITH PROPOFOL;  Surgeon: Lucilla Lame, MD;  Location: [facility] ENDOSCOPY;  Service: Endoscopy;  Laterality: N/A;  . ESOPHAGOGASTRODUODENOSCOPY (EGD) WITH PROPOFOL N/A 11/19/2020   Procedure: ESOPHAGOGASTRODUODENOSCOPY (EGD) WITH PROPOFOL;  Surgeon: Toledo, Benay Pike, MD;  Location: [facility] ENDOSCOPY;  Service:  Gastroenterology;  Laterality: N/A;  . GIVENS CAPSULE STUDY N/A 07/08/2020   Procedure: GIVENS CAPSULE STUDY;  Surgeon: Virgel Manifold, MD;  Location: [facility] ENDOSCOPY;  Service: Endoscopy;  Laterality: N/A;  Administer between 6-8 pm today  . NO PAST SURGERIES      Prior to Admission medications   Medication Sig Start Date End Date Taking? Authorizing Provider  cetirizine (ZYRTEC) 10 MG tablet Take 10 mg by mouth daily.    [provider]  famotidine (PEPCID) 40 MG tablet Take by mouth. 08/15/20   [provider]  ferrous sulfate 325 (65 FE) MG tablet Take 1 tablet (325 mg total) by mouth daily with breakfast. Can get any over-the-counter iron supplement. 07/09/20   Enzo Bi, MD  Multiple Vitamins-Calcium (ONE-A-DAY WOMENS FORMULA) TABS Take 1 tablet by mouth daily. 01/31/20   [provider]  vitamin B-12 1000 MCG tablet Take 1 tablet (1,000 mcg total) by mouth daily. Can get any over-the-counter Vitamin B-12 supplement. 07/10/20   Enzo Bi, MD    Allergies  Allergen Reactions  . Sulfa Antibiotics     Family History  Problem Relation Age of Onset  . Diabetes Father   . Stroke Father   . Breast cancer Maternal Grandmother   . Breast cancer Paternal Aunt   . High Cholesterol Mother   . Colon cancer Neg Hx   . Ovarian cancer Neg Hx   . Heart disease Neg Hx     Social History Social History   Tobacco Use  . Smoking status: Never Smoker  . Smokeless tobacco: Never Used  Vaping Use  . Vaping Use: Never used  Substance Use Topics  . Alcohol use: No  . Drug use: No    Review of Systems Constitutional: Negative for fever Cardiovascular: Negative for chest pain. Respiratory: Negative for shortness of breath. Gastrointestinal: Negative for abdominal pain.  Negative for black or bloody stool. Genitourinary: Negative for urinary compaints Musculoskeletal: Negative for musculoskeletal complaints Neurological: Negative for headache All other ROS  negative  ____________________________________________   PHYSICAL EXAM:  VITAL SIGNS: ED Triage Vitals  Enc Vitals Group     BP 02/07/21 0829 (!) 82/53     Pulse Rate 02/07/21 0829 (!) 131     Resp 02/07/21 0829 20     Temp 02/07/21 0829 97.7 F (36.5 C)     Temp Source 02/07/21 0829 Oral     SpO2 02/07/21 0829 100 %     Weight 02/07/21 0830 128 lb (58.1 kg)     Height 02/07/21 0830 5\' 2"  (1.575 m)     Head Circumference --      Peak Flow --      Pain Score 02/07/21 0830 0     Pain Loc --      Pain Edu? --      Excl. in [place]? --    Constitutional: Alert and oriented. Well appearing and in no distress. Eyes: Normal exam ENT      Head: Normocephalic and atraumatic.      Mouth/Throat: Mucous membranes are moist. Cardiovascular: Regular rhythm rate around 130 bpm.  No murmur. Respiratory: Normal respiratory effort without tachypnea nor retractions. Breath sounds are clear  Gastrointestinal: Soft and nontender. No distention.  Rectal examination shows loose black stool consistent with melena Musculoskeletal: Nontender with normal range of motion in all extremities. No lower extremity tenderness or edema. Neurologic:  Normal speech and language. No gross focal neurologic deficits Skin:  Skin is warm, dry and intact.  Psychiatric: Mood and affect are normal.   ____________________________________________    EKG  EKG viewed and interpreted by myself shows sinus tachycardia 138 bpm with a narrow QRS, normal axis, normal intervals, nonspecific ST changes.  ____________________________________________    INITIAL IMPRESSION / ASSESSMENT AND PLAN / ED COURSE  Pertinent labs & imaging results that were available during my care of the patient were reviewed by me and considered in my medical decision making (see chart for details).   Patient presents to the emergency department for low blood counts dizziness and weakness over the past several days.  Patient's hemoglobin is dropped  4 points in less than 1 month, rectal examination consistent with melena.  Patient states a history of several GI bleeds in the past involving her duodenum.  Patient follows up with Dr. Alice Reichert.  We will attempt to contact Dr. Alice Reichert.  We will repeat lab work including a type and screen.  I reviewed the patient's lab work in care everywhere patient will require blood transfusion of 1 unit given the precipitous drop in melena on exam.  Patient will require admission to the hospital for further work-up and treatment with GI consultation.  Discussed patient with Dr. Marius Ditch who will scope later today.  Patient emitted to the hospital service.  Miia Blanks was evaluated in Emergency Department on 02/07/2021 for the symptoms described in the history of present illness. She was evaluated in the context of the global COVID-19 pandemic, which necessitated consideration that the patient might be at risk for infection with the SARS-CoV-2 virus that causes COVID-19. Institutional protocols and  algorithms that pertain to the evaluation of patients at risk for COVID-19 are in a state of rapid change based on information released by regulatory bodies including the CDC and federal and state organizations. These policies and algorithms were followed during the patient's care in the ED.  CRITICAL CARE Performed by: Harvest Dark   Total critical care time: 30 minutes  Critical care time was exclusive of separately billable procedures and treating other patients.  Critical care was necessary to treat or prevent imminent or life-threatening deterioration.  Critical care was time spent personally by me on the following activities: development of treatment plan with patient and/or surrogate as well as nursing, discussions with consultants, evaluation of patient's response to treatment, examination of patient, obtaining history from patient or surrogate, ordering and performing treatments and interventions, ordering  and review of laboratory studies, ordering and review of radiographic studies, pulse oximetry and re-evaluation of patient's condition.  ____________________________________________   FINAL CLINICAL IMPRESSION(S) / ED DIAGNOSES  GI bleed   Harvest Dark, MD 02/07/21 1526

## 2021-02-07 NOTE — Progress Notes (Signed)
Rapid Response Event Note   Reason for Call :   - Ferre-heme reaction  Initial Focused Assessment:   - Patient is AA+Ox4. - Mild SOB. - Chest and face are flushed.  Interventions:   - At time of arrival, patient has already received Dexa and Benadryl. - Patient also received IV Pepcid. - Dr. Blaine Hamper on phone with bedside RN placing orders.  - No stridor. VS stable. - IVF bolus initiated.  Plan of Care:   - Patient to remain in room for now.  Event Summary:   MD Notified:  Dr. Blaine Hamper Call Time: 5456 hrs Arrival Time: 1847 hrs End Time: 1905 hrs  Jesse Sans, RN    Follow-up: 1945 hrs: patient is asleep in bed. No acute distress. Arousable to voice. Denies questions or concerns at this time.

## 2021-02-07 NOTE — Progress Notes (Signed)
RN notified of pt having reaction following IV feraheme being administered. Rapid Response called for pt. Dr. Blaine Hamper notified. Pt received IV benadryl,  Solu- medrol, pepcid and demerol. Pts MEWs a yellow following medication reaction with a HR of 112.

## 2021-02-07 NOTE — Consult Note (Signed)
Cephas Darby, MD [address]  [place]  [place], [place] [address]  Main: [phone]  Fax: [phone] Pager: [phone]   Consultation  Referring Provider:     No ref. provider found Primary Care Physician:  Gladstone Lighter, MD Primary Gastroenterologist: [facility], Dr. Alice Reichert         Reason for Consultation:     Anemia, syncope, melena  Date of Admission:  02/07/2021 Date of Consultation:  02/07/2021         HPI:   Shakeisha Horine is a 45 y.o. 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  She has been receiving parenteral iron therapy in January 2022.  Her hemoglobin was 12 in February 2022.  She started experiencing dizziness, lightheadedness for last 3 days and had a near syncope.  She had a stat hemoglobin at clinic this morning through her PCP, her hemoglobin dropped to 8.4 with mild leukocytosis and therefore she was advised to go to ER.  She was mildly hypotensive in the ER, received IV fluids, started on pantoprazole drip and GI is consulted for further management.  She had elevated BUN/creatinine ratio 24/0.58, was normal in February 2022.  When interviewed the patient, she already received 1 unit of PRBCs and she reports feeling better.  She does not report noticing any black stools, abdominal pain, rectal bleeding.  However, in the ER, she was reported to have black stool on exam.  She takes oral iron daily.  Patient does not smoke or drink alcohol NSAIDs: None  Antiplts/Anticoagulants/Anti thrombotics: None  GI Procedures:  Upper endoscopy 11/19/2020 as outpatient Duodenal AVMs, treated with APC  Video capsule endoscopy 07/09/2020 Study complete, capsule reach cecum Normal small bowel transit Bleeding AVM identified in the duodenum  Small bowel endoscopy 07/09/2020, previously treated AVMs were detected, no active bleeding  identified  Upper endoscopy 07/06/2020 - Normal esophagus. - Two non-bleeding angiodysplastic lesions in the stomach. Treated with argon plasma coagulation (APC). - Duodenal erosions without bleeding. Biopsied. - A few non-bleeding angiodysplastic lesions in the duodenum. Treated with argon plasma coagulation (APC).  Colonoscopy 3/21 at [facility] Normal terminal ileum, normal colon   Past Medical History:  Diagnosis Date  . Acid reflux   . Hematuria   . Hyperlipemia   . IDA (iron deficiency anemia)   . Melena   . Perimenopausal symptoms   . Vaginal discharge     Past Surgical History:  Procedure Laterality Date  . COLONOSCOPY WITH ESOPHAGOGASTRODUODENOSCOPY (EGD)    . ENTEROSCOPY N/A 07/09/2020   Procedure: ENTEROSCOPY;  Surgeon: Lin Landsman, MD;  Location: [facility] ENDOSCOPY;  Service: Gastroenterology;  Laterality: N/A;  . ESOPHAGOGASTRODUODENOSCOPY (EGD) WITH PROPOFOL N/A 07/06/2020   Procedure: ESOPHAGOGASTRODUODENOSCOPY (EGD) WITH PROPOFOL;  Surgeon: Lucilla Lame, MD;  Location: [facility] ENDOSCOPY;  Service: Endoscopy;  Laterality: N/A;  . ESOPHAGOGASTRODUODENOSCOPY (EGD) WITH PROPOFOL N/A 11/19/2020   Procedure: ESOPHAGOGASTRODUODENOSCOPY (EGD) WITH PROPOFOL;  Surgeon: Toledo, Benay Pike, MD;  Location: [facility] ENDOSCOPY;  Service: Gastroenterology;  Laterality: N/A;  . GIVENS CAPSULE STUDY N/A 07/08/2020   Procedure: GIVENS CAPSULE STUDY;  Surgeon: Virgel Manifold, MD;  Location: [facility] ENDOSCOPY;  Service: Endoscopy;  Laterality: N/A;  Administer between 6-8 pm today  . NO PAST SURGERIES      Prior to Admission medications   Medication Sig Start Date End Date Taking? Authorizing Provider  cetirizine (ZYRTEC)  10 MG tablet Take 10 mg by mouth daily.    [provider]  famotidine (PEPCID) 40 MG tablet Take 40 mg by mouth daily. 08/15/20   [provider]  ferrous sulfate 325 (65 FE) MG tablet Take 1 tablet (325 mg total) by mouth daily with breakfast. Can  get any over-the-counter iron supplement. 07/09/20   Enzo Bi, MD  Multiple Vitamins-Calcium (ONE-A-DAY WOMENS FORMULA) TABS Take 1 tablet by mouth daily. 01/31/20   [provider]  vitamin B-12 1000 MCG tablet Take 1 tablet (1,000 mcg total) by mouth daily. Can get any over-the-counter Vitamin B-12 supplement. 07/10/20   Enzo Bi, MD    Current Facility-Administered Medications:  .  0.9 %  sodium chloride infusion, 10 mL/hr, Intravenous, Once, Ivor Costa, MD .  0.9 %  sodium chloride infusion, , Intravenous, Continuous, Ivor Costa, MD, Last Rate: 100 mL/hr at 02/07/21 1500, Continued from Pre-op at 02/07/21 1500 .  acetaminophen (TYLENOL) tablet 650 mg, 650 mg, Oral, Q6H PRN, Ivor Costa, MD .  EPINEPHrine (ADRENALIN) 1 MG/10ML injection, , , ,  .  [START ON 02/08/2021] ferrous sulfate tablet 325 mg, 325 mg, Oral, Q breakfast, Ivor Costa, MD .  ferumoxytol [facility]) 510 mg in sodium chloride 0.9 % 100 mL IVPB, 510 mg, Intravenous, Once, Guerin Lashomb, Tally Due, MD .  hydrOXYzine (VISTARIL) injection 25 mg, 25 mg, Intramuscular, Q6H PRN, Ivor Costa, MD .  loratadine (CLARITIN) tablet 10 mg, 10 mg, Oral, Daily, Ivor Costa, MD .  Derrill Memo ON 02/08/2021] multivitamin with minerals tablet 1 tablet, 1 tablet, Oral, Daily, Ivor Costa, MD .  pantoprazole (PROTONIX) 80 mg in sodium chloride 0.9 % 100 mL (0.8 mg/mL) infusion, 8 mg/hr, Intravenous, Continuous, Ivor Costa, MD, Last Rate: 10 mL/hr at 02/07/21 1712, 8 mg/hr at 02/07/21 1712 .  [START ON 02/08/2021] vitamin B-12 (CYANOCOBALAMIN) tablet 1,000 mcg, 1,000 mcg, Oral, Daily, Tashera Montalvo, Tally Due, MD  Family History  Problem Relation Age of Onset  . Diabetes Father   . Stroke Father   . Breast cancer Maternal Grandmother   . Breast cancer Paternal Aunt   . High Cholesterol Mother   . Colon cancer Neg Hx   . Ovarian cancer Neg Hx   . Heart disease Neg Hx      Social History   Tobacco Use  . Smoking status: Never Smoker  . Smokeless  tobacco: Never Used  Vaping Use  . Vaping Use: Never used  Substance Use Topics  . Alcohol use: No  . Drug use: No    Allergies as of 02/07/2021 - Review Complete 02/07/2021  Allergen Reaction Noted  . Sulfa antibiotics Other (See Comments) 04/18/2016    Review of Systems:    All systems reviewed and negative except where noted in HPI.   Physical Exam:  Vital signs in last 24 hours: Temp:  [97.7 F (36.5 C)-98.5 F (36.9 C)] 97.8 F (36.6 C) (03/10 1553) Pulse Rate:  [82-131] 82 (03/10 1642) Resp:  [12-31] 20 (03/10 1642) BP: (82-111)/(48-84) 92/58 (03/10 1642) SpO2:  [92 %-100 %] 100 % (03/10 1642) Weight:  [58.1 kg] 58.1 kg (03/10 1445)   General:   Pleasant, cooperative in NAD Head:  Normocephalic and atraumatic. Eyes:   No icterus.   Conjunctiva pink. PERRLA. Ears:  Normal auditory acuity. Neck:  Supple; no masses or thyroidomegaly Lungs: Respirations even and unlabored. Lungs clear to auscultation bilaterally.   No wheezes, crackles, or rhonchi.  Heart:  Regular rate and rhythm;  Without murmur,  clicks, rubs or gallops Abdomen:  Soft, nondistended, nontender. Normal bowel sounds. No appreciable masses or hepatomegaly.  No rebound or guarding.  Rectal:  Not performed. Msk:  Symmetrical without gross deformities.  Strength Generalized weakness  Extremities:  Without edema, cyanosis or clubbing. Neurologic:  Alert and oriented x3;  grossly normal neurologically. Skin:  Intact without significant lesions or rashes. Cervical Nodes:  No significant cervical adenopathy. Psych:  Alert and cooperative. Normal affect.  LAB RESULTS: CBC Latest Ref Rng & Units 02/07/2021 02/07/2021 11/26/2020  WBC 4.0 - 10.5 K/uL 10.3 12.9(H) 5.2  Hemoglobin 12.0 - 15.0 g/dL 8.2(L) 8.6(L) 9.9(L)  Hematocrit 36.0 - 46.0 % 25.0(L) 26.2(L) 30.9(L)  Platelets 150 - 400 K/uL 185 281 311    BMET BMP Latest Ref Rng & Units 02/07/2021 07/09/2020 07/08/2020  Glucose 70 - 99 mg/dL 161(H) 97 102(H)  BUN  6 - 20 mg/dL 24(H) 9 10  Creatinine 0.44 - 1.00 mg/dL 0.58 0.48 0.64  BUN/Creat Ratio 9 - 23 - - -  Sodium 135 - 145 mmol/L 136 138 138  Potassium 3.5 - 5.1 mmol/L 3.7 3.7 3.6  Chloride 98 - 111 mmol/L 105 105 107  CO2 22 - 32 mmol/L 23 28 26   Calcium 8.9 - 10.3 mg/dL 9.2 8.8(L) 8.7(L)    LFT Hepatic Function Latest Ref Rng & Units 02/07/2021 07/07/2020 07/06/2020  Total Protein 6.5 - 8.1 g/dL 7.0 5.7(L) 7.3  Albumin 3.5 - 5.0 g/dL 4.3 3.5 4.4  AST 15 - 41 U/L 35 31 26  ALT 0 - 44 U/L 51(H) 37 28  Alk Phosphatase 38 - 126 U/L 56 49 63  Total Bilirubin 0.3 - 1.2 mg/dL 0.4 0.5 0.5     STUDIES: No results found.    Impression / Plan:   Diedra Sinor is a 45 y.o. female with no signal past medical history, history of chronic iron deficiency anemia secondary to intermittent bleeding from small bowel AVMs, treated in the past, presents with recurrence of anemia, presyncope, elevated BUN/creatinine ratio Concern for recurrent bleed from small bowel AVMs Discussed about push enteroscopy today and patient is agreeable  Patient does not have chronic medical conditions to explain small bowel AVMs.  She will benefit from outpatient octreotide subcutaneous injections to prevent recurrent bleeding from AVMs  I have discussed alternative options, risks & benefits,  which include, but are not limited to, bleeding, infection, perforation,respiratory complication & drug reaction.  The patient agrees with this plan & written consent will be obtained.     Thank you for involving me in the care of this patient.      LOS: 0 days   Sherri Sear, MD  02/07/2021, 5:12 PM   Note: This dictation was prepared with Dragon dictation along with smaller phrase technology. Any transcriptional errors that result from this process are unintentional.

## 2021-02-07 NOTE — ED Triage Notes (Signed)
Patient to ER from [facility] for c/o light headedness. Patient has h/o GI bleed. Had Hgb 12.1 3-4 weeks ago. Stat Hgb at [facility] this am was 8.4. Patient had near syncopal episode while at [facility]. Patient denies noticing any blood in stool at this time.

## 2021-02-07 NOTE — Progress Notes (Signed)
Malfunction of the software occurred during the procedure and part of the images could not be captured.  Attached is the image of the actively bleeding, pulsatile AVM/Dieulafoy's  Rest of the images are uploaded into patient's report  Cephas Darby, MD [address]  [place]  [place], [place] [address]  Main: [phone]  Fax: [phone] Pager: [phone]

## 2021-02-07 NOTE — Anesthesia Postprocedure Evaluation (Signed)
Anesthesia Post Note  Patient: Paige Robertson  Procedure(s) Performed: ENTEROSCOPY (N/A )  Patient location during evaluation: PACU Anesthesia Type: General Level of consciousness: awake and alert Pain management: pain level controlled Vital Signs Assessment: post-procedure vital signs reviewed and stable Respiratory status: spontaneous breathing, nonlabored ventilation and respiratory function stable Cardiovascular status: blood pressure returned to baseline and stable Postop Assessment: no apparent nausea or vomiting Anesthetic complications: no   No complications documented.   Last Vitals:  Vitals:   02/07/21 1901 02/07/21 1919  BP: (!) 107/57 121/78  Pulse: 100 (!) 112  Resp:    Temp: 36.6 C   SpO2: 100% 100%    Last Pain:  Vitals:   02/07/21 1901  TempSrc: Oral  PainSc:                  Tera Mater

## 2021-02-07 NOTE — OR Nursing (Signed)
On awaking Patient in extreme pain.  Notified Dr Marius Ditch and after assessing patient abdomen and talking to her, she ordered Fentanyl 25 mcg IV.   Patient continued to pass a large amount of gas and settled down.  Sleeping at time of note.

## 2021-02-07 NOTE — Progress Notes (Signed)
1842: pt begins to cough, and reports feeling "hot", flushing to the face and neck noted.  Feraheme stopped and primary nurse calling rapid response and MD.  Pt reports difficulty breathing. Staff noted pt to have labored breathing.  AC and Rapid response nurse at bedside.  After medication was administered per protocol pt states SOB has improved but now states "I am shaking". Rigors noted. MD ordered Pepcid and Demerol at this time.  Post Demerol Rigors resolved, pt experiencing nausea.  1925: Pt reports all symptoms resolved but "I feel I have a lump in my throat". Pt denies SOB. Pt and VS stable at this time. Report given to primary nurse.   ** see MAR for medications administered and see flow sheets for VS**

## 2021-02-07 NOTE — Progress Notes (Signed)
   02/07/21 1846  Clinical Encounter Type  Visited With Patient  Visit Type Initial;Spiritual support  Referral From Nurse  Consult/Referral To Chaplain  Spiritual Encounters  Spiritual Needs Prayer;Emotional  Chaplain Jalal Rauch responded to a RR in room 223, Pt Paige Robertson. Pt reported she had difficulties breathing. Medical team checked Pt. Pt is stable. I provided a ministry of presence and prayer.

## 2021-02-07 NOTE — Anesthesia Preprocedure Evaluation (Signed)
Anesthesia Evaluation  Patient identified by MRN, date of birth, ID band Patient awake    Reviewed: Allergy & Precautions, H&P , NPO status , Patient's Chart, lab work & pertinent test results  History of Anesthesia Complications Negative for: history of anesthetic complications  Airway Mallampati: II  TM Distance: >3 FB     Dental  (+) Teeth Intact, Dental Advidsory Given   Pulmonary neg pulmonary ROS, neg shortness of breath, neg sleep apnea, neg COPD, neg recent URI,    breath sounds clear to auscultation       Cardiovascular (-) angina(-) Past MI and (-) Cardiac Stents negative cardio ROS  (-) dysrhythmias  Rhythm:regular Rate:Normal     Neuro/Psych negative neurological ROS  negative psych ROS   GI/Hepatic Neg liver ROS, GERD  Controlled,  Endo/Other  negative endocrine ROS  Renal/GU negative Renal ROS  negative genitourinary   Musculoskeletal   Abdominal   Peds  Hematology  (+) Blood dyscrasia, anemia , Hgb 7.8   Anesthesia Other Findings Past Medical History: No date: Acid reflux No date: Hematuria No date: Hyperlipemia No date: IDA (iron deficiency anemia) No date: Melena No date: Perimenopausal symptoms No date: Vaginal discharge  Past Surgical History: No date: COLONOSCOPY WITH ESOPHAGOGASTRODUODENOSCOPY (EGD) 07/09/2020: ENTEROSCOPY; N/A     Comment:  Procedure: ENTEROSCOPY;  Surgeon: Lin Landsman,               MD;  Location: [facility] ENDOSCOPY;  Service:               Gastroenterology;  Laterality: N/A; 07/06/2020: ESOPHAGOGASTRODUODENOSCOPY (EGD) WITH PROPOFOL; N/A     Comment:  Procedure: ESOPHAGOGASTRODUODENOSCOPY (EGD) WITH               PROPOFOL;  Surgeon: Lucilla Lame, MD;  Location: [facility]               ENDOSCOPY;  Service: Endoscopy;  Laterality: N/A; 07/08/2020: GIVENS CAPSULE STUDY; N/A     Comment:  Procedure: GIVENS CAPSULE STUDY;  Surgeon: Virgel Manifold, MD;   Location: [facility] ENDOSCOPY;  Service:               Endoscopy;  Laterality: N/A;  Administer between 6-8 pm               today No date: NO PAST SURGERIES  BMI    Body Mass Index: 24.33 kg/m      Reproductive/Obstetrics negative OB ROS                             Anesthesia Physical  Anesthesia Plan  ASA: II  Anesthesia Plan: General   Post-op Pain Management:    Induction: Intravenous  PONV Risk Score and Plan: Propofol infusion and TIVA  Airway Management Planned: Nasal Cannula and Natural Airway  Additional Equipment:   Intra-op Plan:   Post-operative Plan:   Informed Consent: I have reviewed the patients History and Physical, chart, labs and discussed the procedure including the risks, benefits and alternatives for the proposed anesthesia with the patient or authorized representative who has indicated his/her understanding and acceptance.     Dental Advisory Given  Plan Discussed with: Anesthesiologist, CRNA and Surgeon  Anesthesia Plan Comments:         Anesthesia Quick Evaluation

## 2021-02-07 NOTE — H&P (Addendum)
History and Physical    Paige Robertson ZOX:[id number] DOB: 09-12-1976 DOA: 02/07/2021  Referring MD/NP/PA:   PCP: Gladstone Lighter, MD   Patient coming from:  The patient is coming from home.  At baseline, pt is independent for most of ADL.        Chief Complaint: Lightheadedness  HPI: Paige Robertson is a 45 y.o. female with medical history significant of GIB, angiodysplasia of duodenum, HLD, GERD, IDA, presents with lightheadedness.  Patient states that she has been having lightheadedness and dizziness in the past 3 days, which has been worsening.  She states that she had a near syncope episode, but did not pass out.  No unilateral numbness or tingling to extremities.  No facial droop or slurred speech.  Patient denies dark stool, rectal bleeding, vaginal bleeding.  No nausea, vomiting, diarrhea or abdominal pain.  Denies chest pain, shortness breath, cough, fever or chills.  Per ED physician, patient has dark melena stool by rectal exam.  Patient was initially hypotensive with a blood pressure 82/53, which improved to 111/78 after giving 1 L normal saline 80.   ED Course: pt was found to have hemoglobin 8.6 (13.4 on 02/07/2021), WBC 12.9, troponin level less than 2, pending COVID-19 PCR, electrolytes renal function okay, temperature normal, tachycardia with heart rate of 131, RR 20, oxygen saturation 100% on room air.  Patient is placed on MedSurg bed for position, Dr. Marius Ditch of GI is consulted  Review of Systems:   General: no fevers, chills, no body weight gain, has fatigue HEENT: no blurry vision, hearing changes or sore throat Respiratory: no dyspnea, coughing, wheezing CV: no chest pain, no palpitations GI: no nausea, vomiting, abdominal pain, diarrhea, constipation GU: no dysuria, burning on urination, increased urinary frequency, hematuria  Ext: no leg edema Neuro: no unilateral weakness, numbness, or tingling, no vision change or hearing loss.  Has dizziness and  lightheadedness Skin: no rash, no skin tear. MSK: No muscle spasm, no deformity, no limitation of range of movement in spin Heme: No easy bruising.  Travel history: No recent long distant travel.  Allergy:  Allergies  Allergen Reactions  . Sulfa Antibiotics Other (See Comments)    Childhood allergy, unknown reaction    Past Medical History:  Diagnosis Date  . Acid reflux   . Hematuria   . Hyperlipemia   . IDA (iron deficiency anemia)   . Melena   . Perimenopausal symptoms   . Vaginal discharge     Past Surgical History:  Procedure Laterality Date  . COLONOSCOPY WITH ESOPHAGOGASTRODUODENOSCOPY (EGD)    . ENTEROSCOPY N/A 07/09/2020   Procedure: ENTEROSCOPY;  Surgeon: Lin Landsman, MD;  Location: [facility] ENDOSCOPY;  Service: Gastroenterology;  Laterality: N/A;  . ESOPHAGOGASTRODUODENOSCOPY (EGD) WITH PROPOFOL N/A 07/06/2020   Procedure: ESOPHAGOGASTRODUODENOSCOPY (EGD) WITH PROPOFOL;  Surgeon: Lucilla Lame, MD;  Location: [facility] ENDOSCOPY;  Service: Endoscopy;  Laterality: N/A;  . ESOPHAGOGASTRODUODENOSCOPY (EGD) WITH PROPOFOL N/A 11/19/2020   Procedure: ESOPHAGOGASTRODUODENOSCOPY (EGD) WITH PROPOFOL;  Surgeon: [place], Benay Pike, MD;  Location: [facility] ENDOSCOPY;  Service: Gastroenterology;  Laterality: N/A;  . GIVENS CAPSULE STUDY N/A 07/08/2020   Procedure: GIVENS CAPSULE STUDY;  Surgeon: Virgel Manifold, MD;  Location: [facility] ENDOSCOPY;  Service: Endoscopy;  Laterality: N/A;  Administer between 6-8 pm today  . NO PAST SURGERIES      Social History:  reports that she has never smoked. She has never used smokeless tobacco. She reports that she does not drink alcohol and does not use drugs.  Family History:  Family History  Problem Relation Age of Onset  . Diabetes Father   . Stroke Father   . Breast cancer Maternal Grandmother   . Breast cancer Paternal Aunt   . High Cholesterol Mother   . Colon cancer Neg Hx   . Ovarian cancer Neg Hx   . Heart disease Neg Hx      Prior  to Admission medications   Medication Sig Start Date End Date Taking? Authorizing Provider  cetirizine (ZYRTEC) 10 MG tablet Take 10 mg by mouth daily.    [provider]  famotidine (PEPCID) 40 MG tablet Take by mouth. 08/15/20   [provider]  ferrous sulfate 325 (65 FE) MG tablet Take 1 tablet (325 mg total) by mouth daily with breakfast. Can get any over-the-counter iron supplement. 07/09/20   Enzo Bi, MD  Multiple Vitamins-Calcium (ONE-A-DAY WOMENS FORMULA) TABS Take 1 tablet by mouth daily. 01/31/20   [provider]  vitamin B-12 1000 MCG tablet Take 1 tablet (1,000 mcg total) by mouth daily. Can get any over-the-counter Vitamin B-12 supplement. 07/10/20   Enzo Bi, MD    Physical Exam: Vitals:   02/07/21 1129 02/07/21 1130 02/07/21 1149 02/07/21 1200  BP: 97/65 97/65 97/77  92/67  Pulse: 96 100 100 97  Resp: 16 17 16 18   Temp: 98 F (36.7 C)  97.9 F (36.6 C)   TempSrc: Oral  Oral   SpO2:  100% 100% 92%  Weight:      Height:       General: Not in acute distress.  Pale looking HEENT:       Eyes: PERRL, EOMI, no scleral icterus.       ENT: No discharge from the ears and nose, no pharynx injection, no tonsillar enlargement.        Neck: No JVD, no bruit, no mass felt. Heme: No neck lymph node enlargement. Cardiac: S1/S2, RRR, No murmurs, No gallops or rubs. Respiratory: No rales, wheezing, rhonchi or rubs. GI: Soft, nondistended, nontender, no rebound pain, no organomegaly, BS present. GU: No hematuria Ext: No pitting leg edema bilaterally. 1+DP/PT pulse bilaterally. Musculoskeletal: No joint deformities, No joint redness or warmth, no limitation of ROM in spin. Skin: No rashes.  Neuro: Alert, oriented X3, cranial nerves II-XII grossly intact, moves all extremities normally.  Psych: Patient is not psychotic, no suicidal or hemocidal ideation.  Labs on Admission: I have personally reviewed following labs and imaging studies  CBC: Recent Labs   Lab 02/07/21 0847  WBC 12.9*  HGB 8.6*  HCT 26.2*  MCV 91.9  PLT 161   Basic Metabolic Panel: Recent Labs  Lab 02/07/21 0847  NA 136  K 3.7  CL 105  CO2 23  GLUCOSE 161*  BUN 24*  CREATININE 0.58  CALCIUM 9.2   GFR: Estimated Creatinine Clearance: 71 mL/min (by C-G formula based on SCr of 0.58 mg/dL). Liver Function Tests: Recent Labs  Lab 02/07/21 0847  AST 35  ALT 51*  ALKPHOS 56  BILITOT 0.4  PROT 7.0  ALBUMIN 4.3   No results for input(s): LIPASE, AMYLASE in the last 168 hours. No results for input(s): AMMONIA in the last 168 hours. Coagulation Profile: No results for input(s): INR, PROTIME in the last 168 hours. Cardiac Enzymes: No results for input(s): CKTOTAL, CKMB, CKMBINDEX, TROPONINI in the last 168 hours. BNP (last 3 results) No results for input(s): PROBNP in the last 8760 hours. HbA1C: No results for input(s): HGBA1C in the last 72  hours. CBG: No results for input(s): GLUCAP in the last 168 hours. Lipid Profile: No results for input(s): CHOL, HDL, LDLCALC, TRIG, CHOLHDL, LDLDIRECT in the last 72 hours. Thyroid Function Tests: No results for input(s): TSH, T4TOTAL, FREET4, T3FREE, THYROIDAB in the last 72 hours. Anemia Panel: No results for input(s): VITAMINB12, FOLATE, FERRITIN, TIBC, IRON, RETICCTPCT in the last 72 hours. Urine analysis:    Component Value Date/Time   COLORURINE YELLOW (A) 02/07/2021 0847   APPEARANCEUR HAZY (A) 02/07/2021 0847   LABSPEC 1.025 02/07/2021 0847   PHURINE 5.0 02/07/2021 [place] 02/07/2021 0847   HGBUR NEGATIVE 02/07/2021 0847   BILIRUBINUR NEGATIVE 02/07/2021 0847   KETONESUR NEGATIVE 02/07/2021 0847   PROTEINUR NEGATIVE 02/07/2021 0847   NITRITE NEGATIVE 02/07/2021 0847   LEUKOCYTESUR TRACE (A) 02/07/2021 0847   Sepsis Labs: @LABRCNTIP (procalcitonin:4,lacticidven:4) ) Recent Results (from the past 240 hour(s))  Resp Panel by RT-PCR (Flu A&B, Covid) Nasopharyngeal Swab     Status:  None   Collection Time: 02/07/21  8:47 AM   Specimen: Nasopharyngeal Swab; Nasopharyngeal(NP) swabs in vial transport medium  Result Value Ref Range Status   SARS Coronavirus 2 by RT PCR NEGATIVE NEGATIVE Final    Comment: (NOTE) SARS-CoV-2 target nucleic acids are NOT DETECTED.  The SARS-CoV-2 RNA is generally detectable in upper respiratory specimens during the acute phase of infection. The lowest concentration of SARS-CoV-2 viral copies this assay can detect is 138 copies/mL. A negative result does not preclude SARS-Cov-2 infection and should not be used as the sole basis for treatment or other patient management decisions. A negative result may occur with  improper specimen collection/handling, submission of specimen other than nasopharyngeal swab, presence of viral mutation(s) within the areas targeted by this assay, and inadequate number of viral copies(<138 copies/mL). A negative result must be combined with clinical observations, patient history, and epidemiological information. The expected result is Negative.  Fact Sheet for Patients:  EntrepreneurPulse.com.au  Fact Sheet for Healthcare Providers:  IncredibleEmployment.be  This test is no t yet approved or cleared by the [place] FDA and  has been authorized for detection and/or diagnosis of SARS-CoV-2 by FDA under an Emergency Use Authorization (EUA). This EUA will remain  in effect (meaning this test can be used) for the duration of the COVID-19 declaration under Section 564(b)(1) of the Act, 21 U.S.C.section 360bbb-3(b)(1), unless the authorization is terminated  or revoked sooner.       Influenza A by PCR NEGATIVE NEGATIVE Final   Influenza B by PCR NEGATIVE NEGATIVE Final    Comment: (NOTE) The Xpert Xpress SARS-CoV-2/FLU/RSV plus assay is intended as an aid in the diagnosis of influenza from Nasopharyngeal swab specimens and should not be used as a sole basis for  treatment. Nasal washings and aspirates are unacceptable for Xpert Xpress SARS-CoV-2/FLU/RSV testing.  Fact Sheet for Patients: EntrepreneurPulse.com.au  Fact Sheet for Healthcare Providers: IncredibleEmployment.be  This test is not yet approved or cleared by the [place] FDA and has been authorized for detection and/or diagnosis of SARS-CoV-2 by FDA under an Emergency Use Authorization (EUA). This EUA will remain in effect (meaning this test can be used) for the duration of the COVID-19 declaration under Section 564(b)(1) of the Act, 21 U.S.C. section 360bbb-3(b)(1), unless the authorization is terminated or revoked.  Performed at [facility], [address], [place], [place] [address]      Radiological Exams on Admission: No results found.   EKG: I have personally reviewed.  Sinus rhythm,  QTC 548, tachycardia, low voltage, LAE, nonspecific T wave change  Assessment/Plan Principal Problem:   GI bleeding Active Problems:   Iron deficiency anemia   Acid reflux   Angiodysplasia of duodenum   Symptomatic anemia   Leukocytosis  Addendum: RN reported that pt developed allergic reaction when getting IV iron infusion -treated with IV solumedrol 125 mg and then 40 mg bid -Pepcid 25 mg bid IV -Benadryl 25 mg bid -prn Epi    GI bleeding, hx of angiodysplasia of duodenum and symptomatic anemia: Hgb 13.4 -->8.6. Dr.Vanga of GI is consulted.  - will place in med-surg bed obs - transfuse 1 units of blood now - IVF: 1.5L NS bolus, then at 100 mL/hr - Start IV pantoprazole gtt - Zofran IV for nausea - Avoid NSAIDs and SQ heparin - Maintain IV access (2 large bore IVs if possible). - Monitor closely and follow q6h cbc, transfuse as necessary, if Hgb<7.0 - LaB: INR, PTT and type screen  Iron deficiency anemia -continue iron supplement  Acid reflux -On Protonix drip currently  Leukocytosis: WBC 12.9, no source of  infection identified, likely reactive. -Follow-up by CBC         DVT ppx: SCD Code Status: Full code Family Communication: not done, no family member is at bed side.   Disposition Plan:  Anticipate discharge back to previous environment Consults called: Dr. Marius Ditch of GI Admission status and Level of care: Med-Surg:  for obs   Status is: Observation  The patient remains OBS appropriate and will d/c before 2 midnights.  Dispo: The patient is from: Home              Anticipated d/c is to: Home              Patient currently is not medically stable to d/c.   Difficult to place patient No          Date of Service 02/07/2021    [facility]   If 7PM-7AM, please contact night-coverage [email or web address] 02/07/2021, 12:30 PM

## 2021-02-08 ENCOUNTER — Encounter: Payer: Self-pay | Admitting: Gastroenterology

## 2021-02-08 DIAGNOSIS — K31819 Angiodysplasia of stomach and duodenum without bleeding: Secondary | ICD-10-CM | POA: Diagnosis not present

## 2021-02-08 DIAGNOSIS — K219 Gastro-esophageal reflux disease without esophagitis: Secondary | ICD-10-CM | POA: Diagnosis present

## 2021-02-08 DIAGNOSIS — D62 Acute posthemorrhagic anemia: Secondary | ICD-10-CM | POA: Diagnosis present

## 2021-02-08 DIAGNOSIS — K31811 Angiodysplasia of stomach and duodenum with bleeding: Secondary | ICD-10-CM | POA: Diagnosis present

## 2021-02-08 DIAGNOSIS — Z20822 Contact with and (suspected) exposure to covid-19: Secondary | ICD-10-CM | POA: Diagnosis present

## 2021-02-08 DIAGNOSIS — Z882 Allergy status to sulfonamides status: Secondary | ICD-10-CM | POA: Diagnosis not present

## 2021-02-08 DIAGNOSIS — Z823 Family history of stroke: Secondary | ICD-10-CM | POA: Diagnosis not present

## 2021-02-08 DIAGNOSIS — D500 Iron deficiency anemia secondary to blood loss (chronic): Secondary | ICD-10-CM | POA: Diagnosis not present

## 2021-02-08 DIAGNOSIS — Z803 Family history of malignant neoplasm of breast: Secondary | ICD-10-CM | POA: Diagnosis not present

## 2021-02-08 DIAGNOSIS — Z79899 Other long term (current) drug therapy: Secondary | ICD-10-CM | POA: Diagnosis not present

## 2021-02-08 DIAGNOSIS — Z888 Allergy status to other drugs, medicaments and biological substances status: Secondary | ICD-10-CM | POA: Diagnosis not present

## 2021-02-08 DIAGNOSIS — Z833 Family history of diabetes mellitus: Secondary | ICD-10-CM | POA: Diagnosis not present

## 2021-02-08 DIAGNOSIS — I959 Hypotension, unspecified: Secondary | ICD-10-CM | POA: Diagnosis present

## 2021-02-08 DIAGNOSIS — Z83438 Family history of other disorder of lipoprotein metabolism and other lipidemia: Secondary | ICD-10-CM | POA: Diagnosis not present

## 2021-02-08 DIAGNOSIS — D72829 Elevated white blood cell count, unspecified: Secondary | ICD-10-CM | POA: Diagnosis present

## 2021-02-08 LAB — CBC
HCT: 22.5 % — ABNORMAL LOW (ref 36.0–46.0)
HCT: 23.4 % — ABNORMAL LOW (ref 36.0–46.0)
Hemoglobin: 7.3 g/dL — ABNORMAL LOW (ref 12.0–15.0)
Hemoglobin: 7.7 g/dL — ABNORMAL LOW (ref 12.0–15.0)
MCH: 29.2 pg (ref 26.0–34.0)
MCH: 29.5 pg (ref 26.0–34.0)
MCHC: 32.4 g/dL (ref 30.0–36.0)
MCHC: 32.9 g/dL (ref 30.0–36.0)
MCV: 90 fL (ref 80.0–100.0)
MCV: 89.7 fL (ref 80.0–100.0)
Platelets: 155 10*3/uL (ref 150–400)
Platelets: 163 10*3/uL (ref 150–400)
RBC: 2.5 MIL/uL — ABNORMAL LOW (ref 3.87–5.11)
RBC: 2.61 MIL/uL — ABNORMAL LOW (ref 3.87–5.11)
RDW: 18.9 % — ABNORMAL HIGH (ref 11.5–15.5)
RDW: 19.1 % — ABNORMAL HIGH (ref 11.5–15.5)
WBC: 14.7 10*3/uL — ABNORMAL HIGH (ref 4.0–10.5)
WBC: 14.8 10*3/uL — ABNORMAL HIGH (ref 4.0–10.5)
nRBC: 0 % (ref 0.0–0.2)
nRBC: 0.1 % (ref 0.0–0.2)

## 2021-02-08 LAB — BASIC METABOLIC PANEL
Anion gap: 5 (ref 5–15)
BUN: 9 mg/dL (ref 6–20)
CO2: 22 mmol/L (ref 22–32)
Calcium: 8.7 mg/dL — ABNORMAL LOW (ref 8.9–10.3)
Chloride: 113 mmol/L — ABNORMAL HIGH (ref 98–111)
Creatinine, Ser: 0.52 mg/dL (ref 0.44–1.00)
GFR, Estimated: >60 mL/min (ref >60)
Glucose, Bld: 125 mg/dL — ABNORMAL HIGH (ref 70–99)
Potassium: 3.9 mmol/L (ref 3.5–5.1)
Sodium: 140 mmol/L (ref 135–145)

## 2021-02-08 LAB — HEMOGLOBIN AND HEMATOCRIT, BLOOD
HCT: 25.4 % — ABNORMAL LOW (ref 36.0–46.0)
Hemoglobin: 8.3 g/dL — ABNORMAL LOW (ref 12.0–15.0)

## 2021-02-08 LAB — IRON AND TIBC
Iron: 49 ug/dL (ref 28–170)
Saturation Ratios: 17 % (ref 10.4–31.8)
TIBC: 297 ug/dL (ref 250–450)
UIBC: 248 ug/dL

## 2021-02-08 LAB — PREPARE RBC (CROSSMATCH)

## 2021-02-08 LAB — GLUCOSE, CAPILLARY: Glucose-Capillary: 99 mg/dL (ref 70–99)

## 2021-02-08 LAB — VITAMIN B12: Vitamin B-12: 255 pg/mL (ref 180–914)

## 2021-02-08 MED ORDER — SODIUM CHLORIDE 0.9% IV SOLUTION: Freq: Once | INTRAVENOUS | Status: AC

## 2021-02-08 NOTE — Progress Notes (Signed)
PROGRESS NOTE    Paige Robertson  NLG:[id number] DOB: 12-Jan-1976 DOA: 02/07/2021 PCP: Gladstone Lighter, MD   Chief complaint.  Melena. Brief Narrative:  Paige Robertson is a 45 y.o. female with medical history significant of GIB, angiodysplasia of duodenum, HLD, GERD, IDA, presents with lightheadedness.  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  She was hypotensive when she arrived in the hospital, she received fluids bolus.  She also received blood transfusion.  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  Her bleeding seems to be stopped.  However, due to high risk of recurrence of bleeding, she will be kept in the hospital for observation for another day.   Assessment & Plan:   Principal Problem:   GI bleeding Active Problems:   Iron deficiency anemia   Acid reflux   Angiodysplasia of duodenum   Symptomatic anemia   Leukocytosis   Acute blood loss anemia  #1. Acute blood loss anemia secondary to upper GI bleed. 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 Iron deficient anemia. Status post EGD and argon laser and clip treatment. Received another unit of PRBC today, no additional black stool.  Will check CBC tomorrow. If her hemoglobin stable tomorrow, she will be discharged home.  2.  Allergic reaction to Feraheme. Condition had improved.  Patient states that she had a multiple iron infusion with iron sucrose previously, never had a reaction to it.  Currently, patient does not need another iron infusion as her iron level is normal.      DVT prophylaxis: SCDs Code Status: Full Family Communication: Husband at bedside updated Disposition Plan:  .   Status is: Inpatient  Remains inpatient appropriate because:Inpatient level of care appropriate due to severity of illness   Dispo: The patient is from: Home              Anticipated d/c is to: Home              Patient currently is not medically stable to d/c.   Difficult to place  patient No        I/O last 3 completed shifts: In: 2157 [I.V.:1271.8; Blood:760; IV Piggyback:125.2] Out: 0  Total I/O In: 850 [P.O.:480; Blood:370] Out: -      Consultants:   GI  Procedures: EGD with push endoscopy.  Antimicrobials: None  Subjective: Patient doing well today, no additional black stool.  No nausea vomiting.  No abdominal pain. Denies any short of breath or cough. No fever or chills. No dysuria or hematuria.  Objective: Vitals:   02/08/21 0949 02/08/21 1017 02/08/21 1053 02/08/21 1327  BP: (!) 91/46 (!) 89/51 (!) 87/52 (!) 83/49  Pulse: 80 80 72 84  Resp: 16 16 16    Temp: 98.1 F (36.7 C) 97.8 F (36.6 C) 98.4 F (36.9 C)   TempSrc: Oral Oral Oral   SpO2: 100% 100% 98% 100%  Weight:      Height:        Intake/Output Summary (Last 24 hours) at 02/08/2021 1400 Last data filed at 02/08/2021 1315 Gross per 24 hour  Intake 2147.13 ml  Output 0 ml  Net 2147.13 ml   Filed Weights   02/07/21 0830 02/07/21 1445  Weight: 58.1 kg 58.1 kg    Examination:  General exam: Appears calm and comfortable  Respiratory system: Clear to auscultation. Respiratory effort normal. Cardiovascular system: S1 & S2 heard, RRR. No JVD, murmurs, rubs, gallops or clicks. No pedal edema. Gastrointestinal  system: Abdomen is nondistended, soft and nontender. No organomegaly or masses felt. Normal bowel sounds heard. Central nervous system: Alert and oriented. No focal neurological deficits. Extremities: Symmetric 5 x 5 power. Skin: No rashes, lesions or ulcers Psychiatry: Judgement and insight appear normal. Mood & affect appropriate.     Data Reviewed: I have personally reviewed following labs and imaging studies  CBC: Recent Labs  Lab 02/07/21 0847 02/07/21 1427 02/07/21 2144 02/08/21 0352 02/08/21 0922  WBC 12.9* 10.3 10.2 14.7* 14.8*  HGB 8.6* 8.2* 7.0* 7.3* 7.7*  HCT 26.2* 25.0* 21.3* 22.5* 23.4*  MCV 91.9 89.3 90.3 90.0 89.7  PLT 281 185 143* 155  151   Basic Metabolic Panel: Recent Labs  Lab 02/07/21 0847 02/08/21 0352  NA 136 140  K 3.7 3.9  CL 105 113*  CO2 23 22  GLUCOSE 161* 125*  BUN 24* 9  CREATININE 0.58 0.52  CALCIUM 9.2 8.7*   GFR: Estimated Creatinine Clearance: 71 mL/min (by C-G formula based on SCr of 0.52 mg/dL). Liver Function Tests: Recent Labs  Lab 02/07/21 0847  AST 35  ALT 51*  ALKPHOS 56  BILITOT 0.4  PROT 7.0  ALBUMIN 4.3   No results for input(s): LIPASE, AMYLASE in the last 168 hours. No results for input(s): AMMONIA in the last 168 hours. Coagulation Profile: Recent Labs  Lab 02/07/21 1427  INR 1.0   Cardiac Enzymes: No results for input(s): CKTOTAL, CKMB, CKMBINDEX, TROPONINI in the last 168 hours. BNP (last 3 results) No results for input(s): PROBNP in the last 8760 hours. HbA1C: No results for input(s): HGBA1C in the last 72 hours. CBG: Recent Labs  Lab 02/07/21 1846 02/08/21 0754  GLUCAP 145* 99   Lipid Profile: No results for input(s): CHOL, HDL, LDLCALC, TRIG, CHOLHDL, LDLDIRECT in the last 72 hours. Thyroid Function Tests: No results for input(s): TSH, T4TOTAL, FREET4, T3FREE, THYROIDAB in the last 72 hours. Anemia Panel: Recent Labs    02/07/21 0847 02/07/21 1421 02/08/21 0742 02/08/21 0922  VITAMINB12  --  246 255  --   FOLATE 13.5  --   --   --   FERRITIN 50  --   --   --   TIBC 382  --   --  297  IRON 91  --   --  49   Sepsis Labs: No results for input(s): PROCALCITON, LATICACIDVEN in the last 168 hours.  Recent Results (from the past 240 hour(s))  Resp Panel by RT-PCR (Flu A&B, Covid) Nasopharyngeal Swab     Status: None   Collection Time: 02/07/21  8:47 AM   Specimen: Nasopharyngeal Swab; Nasopharyngeal(NP) swabs in vial transport medium  Result Value Ref Range Status   SARS Coronavirus 2 by RT PCR NEGATIVE NEGATIVE Final    Comment: (NOTE) SARS-CoV-2 target nucleic acids are NOT DETECTED.  The SARS-CoV-2 RNA is generally detectable in upper  respiratory specimens during the acute phase of infection. The lowest concentration of SARS-CoV-2 viral copies this assay can detect is 138 copies/mL. A negative result does not preclude SARS-Cov-2 infection and should not be used as the sole basis for treatment or other patient management decisions. A negative result may occur with  improper specimen collection/handling, submission of specimen other than nasopharyngeal swab, presence of viral mutation(s) within the areas targeted by this assay, and inadequate number of viral copies(<138 copies/mL). A negative result must be combined with clinical observations, patient history, and epidemiological information. The expected result is Negative.  Fact Sheet for Patients:  EntrepreneurPulse.com.au  Fact Sheet for Healthcare Providers:  IncredibleEmployment.be  This test is no t yet approved or cleared by the [place] FDA and  has been authorized for detection and/or diagnosis of SARS-CoV-2 by FDA under an Emergency Use Authorization (EUA). This EUA will remain  in effect (meaning this test can be used) for the duration of the COVID-19 declaration under Section 564(b)(1) of the Act, 21 U.S.C.section 360bbb-3(b)(1), unless the authorization is terminated  or revoked sooner.       Influenza A by PCR NEGATIVE NEGATIVE Final   Influenza B by PCR NEGATIVE NEGATIVE Final    Comment: (NOTE) The Xpert Xpress SARS-CoV-2/FLU/RSV plus assay is intended as an aid in the diagnosis of influenza from Nasopharyngeal swab specimens and should not be used as a sole basis for treatment. Nasal washings and aspirates are unacceptable for Xpert Xpress SARS-CoV-2/FLU/RSV testing.  Fact Sheet for Patients: EntrepreneurPulse.com.au  Fact Sheet for Healthcare Providers: IncredibleEmployment.be  This test is not yet approved or cleared by the [place] FDA and has been  authorized for detection and/or diagnosis of SARS-CoV-2 by FDA under an Emergency Use Authorization (EUA). This EUA will remain in effect (meaning this test can be used) for the duration of the COVID-19 declaration under Section 564(b)(1) of the Act, 21 U.S.C. section 360bbb-3(b)(1), unless the authorization is terminated or revoked.  Performed at [facility], [address] [place], [place] [address]          Radiology Studies: No results found.      Scheduled Meds: . diphenhydrAMINE  25 mg Intravenous BID  . ferrous sulfate  325 mg Oral Q breakfast  . loratadine  10 mg Oral Daily  . multivitamin with minerals  1 tablet Oral Daily  . vitamin B-12  1,000 mcg Oral Daily   Continuous Infusions: . famotidine (PEPCID) IV 20 mg (02/08/21 0755)  . pantoprozole (PROTONIX) infusion 8 mg/hr (02/08/21 0515)     LOS: 0 days    Time spent: 28 minutes    Sharen Hones, MD [facility]   To contact the attending provider between 7A-7P or the covering provider during after hours 7P-7A, please log into the web site [email or web address] and access using universal [place] password for that web site. If you do not have the password, please call the hospital operator.  02/08/2021, 2:00 PM

## 2021-02-08 NOTE — Progress Notes (Signed)
Cephas Darby, MD [address]  [place], [place] [address]  Main: [phone]  Fax: [phone] Pager: [phone]   Subjective: Patient had 1 episode of black tarry stool after the procedure yesterday, denies any abdominal pain today she denies any abdominal distention, tolerating clear liquids well.  She denies any lightheadedness.  She does report generalized weakness.  She is receiving blood transfusion.  She had a reaction to Feraheme last night and it was stopped.  Patient's husband is bedside  Objective: Vital signs in last 24 hours: Vitals:   02/08/21 0949 02/08/21 1017 02/08/21 1053 02/08/21 1327  BP: (!) 91/46 (!) 89/51 (!) 87/52 (!) 83/49  Pulse: 80 80 72 84  Resp: 16 16 16    Temp: 98.1 F (36.7 C) 97.8 F (36.6 C) 98.4 F (36.9 C)   TempSrc: Oral Oral Oral   SpO2: 100% 100% 98% 100%  Weight:      Height:       Weight change:   Intake/Output Summary (Last 24 hours) at 02/08/2021 1348 Last data filed at 02/08/2021 1315 Gross per 24 hour  Intake 2147.13 ml  Output 0 ml  Net 2147.13 ml     Exam: Heart:: Regular rate and rhythm, S1S2 present or without murmur or extra heart sounds Lungs: normal and clear to auscultation Abdomen: soft, nontender, normal bowel sounds   Lab Results: CBC Latest Ref Rng & Units 02/08/2021 02/08/2021 02/07/2021  WBC 4.0 - 10.5 K/uL 14.8(H) 14.7(H) 10.2  Hemoglobin 12.0 - 15.0 g/dL 7.7(L) 7.3(L) 7.0(L)  Hematocrit 36.0 - 46.0 % 23.4(L) 22.5(L) 21.3(L)  Platelets 150 - 400 K/uL 163 155 143(L)   CMP Latest Ref Rng & Units 02/08/2021 02/07/2021 07/09/2020  Glucose 70 - 99 mg/dL 125(H) 161(H) 97  BUN 6 - 20 mg/dL 9 24(H) 9  Creatinine 0.44 - 1.00 mg/dL 0.52 0.58 0.48  Sodium 135 - 145 mmol/L 140 136 138  Potassium 3.5 - 5.1 mmol/L 3.9 3.7 3.7  Chloride 98 - 111 mmol/L 113(H) 105 105  CO2 22 - 32 mmol/L 22 23 28   Calcium 8.9 - 10.3 mg/dL 8.7(L) 9.2 8.8(L)  Total Protein 6.5 - 8.1 g/dL - 7.0 -  Total Bilirubin  0.3 - 1.2 mg/dL - 0.4 -  Alkaline Phos 38 - 126 U/L - 56 -  AST 15 - 41 U/L - 35 -  ALT 0 - 44 U/L - 51(H) -    Micro Results: Recent Results (from the past 240 hour(s))  Resp Panel by RT-PCR (Flu A&B, Covid) Nasopharyngeal Swab     Status: None   Collection Time: 02/07/21  8:47 AM   Specimen: Nasopharyngeal Swab; Nasopharyngeal(NP) swabs in vial transport medium  Result Value Ref Range Status   SARS Coronavirus 2 by RT PCR NEGATIVE NEGATIVE Final    Comment: (NOTE) SARS-CoV-2 target nucleic acids are NOT DETECTED.  The SARS-CoV-2 RNA is generally detectable in upper respiratory specimens during the acute phase of infection. The lowest concentration of SARS-CoV-2 viral copies this assay can detect is 138 copies/mL. A negative result does not preclude SARS-Cov-2 infection and should not be used as the sole basis for treatment or other patient management decisions. A negative result may occur with  improper specimen collection/handling, submission of specimen other than nasopharyngeal swab, presence of viral mutation(s) within the areas targeted by this assay, and inadequate number of viral copies(<138 copies/mL). A negative result must be combined with clinical observations, patient history, and epidemiological information. The expected result  is Negative.  Fact Sheet for Patients:  EntrepreneurPulse.com.au  Fact Sheet for Healthcare Providers:  IncredibleEmployment.be  This test is no t yet approved or cleared by the [place] FDA and  has been authorized for detection and/or diagnosis of SARS-CoV-2 by FDA under an Emergency Use Authorization (EUA). This EUA will remain  in effect (meaning this test can be used) for the duration of the COVID-19 declaration under Section 564(b)(1) of the Act, 21 U.S.C.section 360bbb-3(b)(1), unless the authorization is terminated  or revoked sooner.       Influenza A by PCR NEGATIVE NEGATIVE Final    Influenza B by PCR NEGATIVE NEGATIVE Final    Comment: (NOTE) The Xpert Xpress SARS-CoV-2/FLU/RSV plus assay is intended as an aid in the diagnosis of influenza from Nasopharyngeal swab specimens and should not be used as a sole basis for treatment. Nasal washings and aspirates are unacceptable for Xpert Xpress SARS-CoV-2/FLU/RSV testing.  Fact Sheet for Patients: EntrepreneurPulse.com.au  Fact Sheet for Healthcare Providers: IncredibleEmployment.be  This test is not yet approved or cleared by the [place] FDA and has been authorized for detection and/or diagnosis of SARS-CoV-2 by FDA under an Emergency Use Authorization (EUA). This EUA will remain in effect (meaning this test can be used) for the duration of the COVID-19 declaration under Section 564(b)(1) of the Act, 21 U.S.C. section 360bbb-3(b)(1), unless the authorization is terminated or revoked.  Performed at [facility], [address], [place], [place] [address]    Studies/Results: No results found. Medications:  I have reviewed the patient's current medications. Prior to Admission:  Medications Prior to Admission  Medication Sig Dispense Refill Last Dose  . cetirizine (ZYRTEC) 10 MG tablet Take 10 mg by mouth daily.   02/06/2021 at 0630  . famotidine (PEPCID) 40 MG tablet Take 40 mg by mouth daily.   02/06/2021 at 0630  . ferrous sulfate 325 (65 FE) MG tablet Take 1 tablet (325 mg total) by mouth daily with breakfast. Can get any over-the-counter iron supplement.  3 02/06/2021 at 0630  . Multiple Vitamins-Calcium (ONE-A-DAY WOMENS FORMULA) TABS Take 1 tablet by mouth daily.   Past Week at Unknown time  . vitamin B-12 1000 MCG tablet Take 1 tablet (1,000 mcg total) by mouth daily. Can get any over-the-counter Vitamin B-12 supplement.   02/06/2021 at 0630   Scheduled: . diphenhydrAMINE  25 mg Intravenous BID  . ferrous sulfate  325 mg Oral Q breakfast  . loratadine  10 mg  Oral Daily  . multivitamin with minerals  1 tablet Oral Daily  . vitamin B-12  1,000 mcg Oral Daily   Continuous: . famotidine (PEPCID) IV 20 mg (02/08/21 0755)  . pantoprozole (PROTONIX) infusion 8 mg/hr (02/08/21 0515)   XKG:YJEHUDJSHFWYO, EPINEPHrine, hydrOXYzine Anti-infectives (From admission, onward)   None     Scheduled Meds: . diphenhydrAMINE  25 mg Intravenous BID  . ferrous sulfate  325 mg Oral Q breakfast  . loratadine  10 mg Oral Daily  . multivitamin with minerals  1 tablet Oral Daily  . vitamin B-12  1,000 mcg Oral Daily   Continuous Infusions: . famotidine (PEPCID) IV 20 mg (02/08/21 0755)  . 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  Bleeding stopped by the end of procedure BUN has normalized today Monitor CBC closely and for recurrence of GI bleed Continue full liquid diet Patient can go home tomorrow if hemoglobin remains stable Expect melena today as she had bleeding AVM detected on endoscopy yesterday  Dr. Vicente Males will cover from tomorrow     LOS: 0 days   Rohini Vanga 02/08/2021, 1:48 PM

## 2021-02-09 DIAGNOSIS — D62 Acute posthemorrhagic anemia: Secondary | ICD-10-CM

## 2021-02-09 DIAGNOSIS — K31819 Angiodysplasia of stomach and duodenum without bleeding: Secondary | ICD-10-CM

## 2021-02-09 LAB — BASIC METABOLIC PANEL
Anion gap: 5 (ref 5–15)
BUN: 20 mg/dL (ref 6–20)
CO2: 23 mmol/L (ref 22–32)
Calcium: 8.5 mg/dL — ABNORMAL LOW (ref 8.9–10.3)
Chloride: 109 mmol/L (ref 98–111)
Creatinine, Ser: 0.6 mg/dL (ref 0.44–1.00)
GFR, Estimated: >60 mL/min (ref >60)
Glucose, Bld: 91 mg/dL (ref 70–99)
Potassium: 3.6 mmol/L (ref 3.5–5.1)
Sodium: 137 mmol/L (ref 135–145)

## 2021-02-09 LAB — TYPE AND SCREEN
ABO/RH(D): O POS
Antibody Screen: NEGATIVE
Unit division: 0
Unit division: 0

## 2021-02-09 LAB — MAGNESIUM: Magnesium: 2.1 mg/dL (ref 1.7–2.4)

## 2021-02-09 LAB — GLUCOSE, CAPILLARY
Glucose-Capillary: 86 mg/dL (ref 70–99)
Glucose-Capillary: 84 mg/dL (ref 70–99)

## 2021-02-09 LAB — HEMOGLOBIN
Hemoglobin: 7.7 g/dL — ABNORMAL LOW (ref 12.0–15.0)
Hemoglobin: 7.5 g/dL — ABNORMAL LOW (ref 12.0–15.0)

## 2021-02-09 LAB — CBC
HCT: 22.5 % — ABNORMAL LOW (ref 36.0–46.0)
Hemoglobin: 7.2 g/dL — ABNORMAL LOW (ref 12.0–15.0)
MCH: 29.1 pg (ref 26.0–34.0)
MCHC: 32 g/dL (ref 30.0–36.0)
MCV: 91.1 fL (ref 80.0–100.0)
Platelets: 141 10*3/uL — ABNORMAL LOW (ref 150–400)
RBC: 2.47 MIL/uL — ABNORMAL LOW (ref 3.87–5.11)
RDW: 18.5 % — ABNORMAL HIGH (ref 11.5–15.5)
WBC: 11.7 10*3/uL — ABNORMAL HIGH (ref 4.0–10.5)
nRBC: 0.3 % — ABNORMAL HIGH (ref 0.0–0.2)

## 2021-02-09 LAB — BPAM RBC
Blood Product Expiration Date: 202204112359
Blood Product Expiration Date: 202204112359
ISSUE DATE / TIME: 202203101125
ISSUE DATE / TIME: 202203110954
Unit Type and Rh: 5100
Unit Type and Rh: 5100

## 2021-02-09 LAB — COPPER, SERUM: Copper: 64 ug/dL — ABNORMAL LOW (ref 80–158)

## 2021-02-09 MED ORDER — SODIUM CHLORIDE 0.9 % IV SOLN
300.0000 mg | Freq: Once | INTRAVENOUS | Status: AC
  Administered 2021-02-09: 300 mg via INTRAVENOUS
  Filled 2021-02-09: qty 15

## 2021-02-09 MED ORDER — PANTOPRAZOLE SODIUM 40 MG PO TBEC: 40.0000 mg | DELAYED_RELEASE_TABLET | Freq: Two times a day (BID) | ORAL | 0 refills | Status: DC

## 2021-02-09 NOTE — Progress Notes (Signed)
   02/07/21 1857  Assess: MEWS Score  Temp 97.9 F (36.6 C)  BP 98/62  Pulse Rate (!) 102  SpO2 100 %  Assess: MEWS Score  MEWS Temp 0  MEWS Systolic 1  MEWS Pulse 1  MEWS RR 0  MEWS LOC 0  MEWS Score 2  MEWS Score Color Yellow  Assess: if the MEWS score is Yellow or Red  Were vital signs taken at a resting state? Yes  Focused Assessment Change from prior assessment (see assessment flowsheet)  Early Detection of Sepsis Score *See Row Information* Low  MEWS guidelines implemented *See Row Information* Yes  Treat  MEWS Interventions Administered prn meds/treatments  Pain Scale 0-10  Pain Score 0  Take Vital Signs  Increase Vital Sign Frequency  Yellow: Q 2hr X 2 then Q 4hr X 2, if remains yellow, continue Q 4hrs  Escalate  MEWS: Escalate Yellow: discuss with charge nurse/RN and consider discussing with provider and RRT  Notify: Charge Nurse/RN  Name of Charge Nurse/RN Notified olivia rogers  Date Charge Nurse/RN Notified 02/07/21  Time Charge Nurse/RN Notified 1857  Notify: Provider  Provider Name/Title niu  Date Provider Notified 02/07/21  Time Provider Notified 1857  Notification Type Call  Notification Reason Change in status  Provider response See new orders  Date of Provider Response 02/07/21  Time of Provider Response 1857  Notify: Rapid Response  Name of Rapid Response RN Notified Dylan  Date Rapid Response Notified 02/07/21  Time Rapid Response Notified 1959  Document  Patient Outcome Stabilized after interventions  Progress note created (see row info) Yes  Inserted for [place] RN

## 2021-02-09 NOTE — Progress Notes (Signed)
Jonathon Bellows , MD [address] [place], [place], [place], [address] [address] [place], [place], [place], [address] Phone: [phone]  Fax: [phone]   Andriana Casa is being followed for GI bleed status post small bowel enteroscopy  Subjective: No bowel movements, no complaints, wants to go home .    Objective: Vital signs in last 24 hours: Vitals:   02/08/21 1555 02/08/21 2258 02/09/21 0510 02/09/21 0827  BP: (!) 84/53 (!) 82/42 (!) 84/47 (!) 89/48  Pulse: 79 79 66 74  Resp: 19 16 16    Temp: 98.1 F (36.7 C) 98.7 F (37.1 C) 98.2 F (36.8 C) 98.1 F (36.7 C)  TempSrc: Oral Oral  Oral  SpO2: 99% 99% 99% 99%  Weight:      Height:       Weight change:   Intake/Output Summary (Last 24 hours) at 02/09/2021 5284 Last data filed at 02/08/2021 1909 Gross per 24 hour  Intake 2074.34 ml  Output --  Net 2074.34 ml     Exam:  Abdomen: soft, nontender, normal bowel sounds   Lab Results: @LABTEST2 @ Micro Results: Recent Results (from the past 240 hour(s))  Resp Panel by RT-PCR (Flu A&B, Covid) Nasopharyngeal Swab     Status: None   Collection Time: 02/07/21  8:47 AM   Specimen: Nasopharyngeal Swab; Nasopharyngeal(NP) swabs in vial transport medium  Result Value Ref Range Status   SARS Coronavirus 2 by RT PCR NEGATIVE NEGATIVE Final    Comment: (NOTE) SARS-CoV-2 target nucleic acids are NOT DETECTED.  The SARS-CoV-2 RNA is generally detectable in upper respiratory specimens during the acute phase of infection. The lowest concentration of SARS-CoV-2 viral copies this assay can detect is 138 copies/mL. A negative result does not preclude SARS-Cov-2 infection and should not be used as the sole basis for treatment or other patient management decisions. A negative result may occur with  improper specimen collection/handling, submission of specimen other than nasopharyngeal swab, presence of viral mutation(s) within the areas targeted by this assay, and  inadequate number of viral copies(<138 copies/mL). A negative result must be combined with clinical observations, patient history, and epidemiological information. The expected result is Negative.  Fact Sheet for Patients:  EntrepreneurPulse.com.au  Fact Sheet for Healthcare Providers:  IncredibleEmployment.be  This test is no t yet approved or cleared by the [place] FDA and  has been authorized for detection and/or diagnosis of SARS-CoV-2 by FDA under an Emergency Use Authorization (EUA). This EUA will remain  in effect (meaning this test can be used) for the duration of the COVID-19 declaration under Section 564(b)(1) of the Act, 21 U.S.C.section 360bbb-3(b)(1), unless the authorization is terminated  or revoked sooner.       Influenza A by PCR NEGATIVE NEGATIVE Final   Influenza B by PCR NEGATIVE NEGATIVE Final    Comment: (NOTE) The Xpert Xpress SARS-CoV-2/FLU/RSV plus assay is intended as an aid in the diagnosis of influenza from Nasopharyngeal swab specimens and should not be used as a sole basis for treatment. Nasal washings and aspirates are unacceptable for Xpert Xpress SARS-CoV-2/FLU/RSV testing.  Fact Sheet for Patients: EntrepreneurPulse.com.au  Fact Sheet for Healthcare Providers: IncredibleEmployment.be  This test is not yet approved or cleared by the [place] FDA and has been authorized for detection and/or diagnosis of SARS-CoV-2 by FDA under an Emergency Use Authorization (EUA). This EUA will remain in effect (meaning this test can be used) for the duration of the COVID-19 declaration under Section 564(b)(1) of the Act,  21 U.S.C. section 360bbb-3(b)(1), unless the authorization is terminated or revoked.  Performed at [facility], [address] [place], [place] [address]    Studies/Results: No results found. Medications: I have reviewed the patient's current  medications. Scheduled Meds: . ferrous sulfate  325 mg Oral Q breakfast  . loratadine  10 mg Oral Daily  . multivitamin with minerals  1 tablet Oral Daily  . vitamin B-12  1,000 mcg Oral Daily   Continuous Infusions: . pantoprozole (PROTONIX) infusion 8 mg/hr (02/08/21 2302)   PRN Meds:.acetaminophen, EPINEPHrine, hydrOXYzine   Assessment: Principal Problem:   GI bleeding Active Problems:   Iron deficiency anemia   Acid reflux   Angiodysplasia of duodenum   Symptomatic anemia   Leukocytosis   Acute blood loss anemia  Muntaha Vermette 45 y.o. female underwent small bowel push enteroscopy on 02/08/2021 with APC of AVMs.  Hemoglobin this morning is 7.2 g down from 8.3 g but stable when rechecked, not had a bowel movement today , no other complaints.  No elevation in BUN/creatinine ratio.  Plan: 1.  Monitor CBC and transfuse if needed.Recheck Hb at 4 pm if stable home today and follow up with Gladstone Lighter, MD and have repeat Hb checked in a couple of days and then follow up with Dr Marius Ditch, would need repeat push enteroscopy in a few weeks to confirm eradication of AVM's and could consider repeat capsule in a few months to rule out any AVMS in the other areas of the small bowel  2.  If there is a further drop in hemoglobin would need a capsule study of the small bowel to evaluate if there are other AVMs which are deeper down which could be bleeding which can be done tomorrow. 3.  Her B12 level is borderline normal I would suggest replacing it with IM shots. 4. Full liquid diet till 4 pm     LOS: 1 day   Jonathon Bellows, MD 02/09/2021, 8:51 AM

## 2021-02-09 NOTE — Discharge Summary (Signed)
Physician Discharge Summary  Patient ID: Paige Robertson MRN: [id number] DOB/AGE: May 21, 1976 45 y.o.  Admit date: 02/07/2021 Discharge date: 02/09/2021  Admission Diagnoses:  Discharge Diagnoses:  Principal Problem:   GI bleeding Active Problems:   Iron deficiency anemia   Acid reflux   Angiodysplasia of duodenum   Symptomatic anemia   Leukocytosis   Acute blood loss anemia   Discharged Condition: good  Hospital Course:  Paige Robertson a 45 y.o.femalewith medical history significant ofGIB,angiodysplasia of duodenum,HLD, GERD, IDA, presents withlightheadedness.Patient had a recurrent upper GI bleed from AVM. She was hypotensive when she arrived in the hospital, she received fluids bolus. She also received blood transfusion. 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 Her bleeding seems to be stopped. However, due to high risk of recurrence of bleeding, she will be kept in the hospital for observation for another day.  #1. Acute blood loss anemia secondary to upper GI bleed. 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 Iron deficient anemia. Status post EGD and argon laser and clip treatment. Patient was given 1 unit PRBC yesterday, she also received IV iron today with iron sucrose, he did not have any reaction to it. Discussed with Dr. Vicente Males, 4pm hemoglobin was stable, she is medically stable to discharge.  2.  Allergic reaction to Feraheme. No additional allergy to IV iron.   Consults: GI  Significant Diagnostic Studies:   - A few bleeding angiodysplastic lesions in the duodenum. Treatment successful. Treated with argon plasma coagulation (APC). Clip was placed. Treatment successful. Tattooed. - Normal examined duodenum. - Normal esophagus. - Normal stomach. - No specimens collected. Impression: - Return patient to hospital ward for ongoing care. - Clear liquid diet today. - CBC every 8hrs, transfuse to maintain Hb >7 - IV  iron - PPI BID  Treatments: Endoscopy, transfusion, IV Iron, PPI  Discharge Exam: Blood pressure (!) 89/57, pulse 76, temperature 98.5 F (36.9 C), resp. rate 20, height 5\' 2"  (1.575 m), weight 58.1 kg, last menstrual period 01/24/2021, SpO2 99 %. General appearance: alert and cooperative Resp: clear to auscultation bilaterally Cardio: regular rate and rhythm, S1, S2 normal, no murmur, click, rub or gallop GI: soft, non-tender; bowel sounds normal; no masses,  no organomegaly Extremities: extremities normal, atraumatic, no cyanosis or edema  Disposition: Discharge disposition: 01-Home or Self Care       Discharge Instructions    Diet - low sodium heart healthy   Complete by: As directed    Increase activity slowly   Complete by: As directed      Allergies as of 02/09/2021      Reactions   Feraheme [ferumoxytol] Anaphylaxis   Sulfa Antibiotics Other (See Comments)   Childhood allergy, unknown reaction      Medication List    TAKE these medications   cetirizine 10 MG tablet Commonly known as: ZYRTEC Take 10 mg by mouth daily.   cyanocobalamin 1000 MCG tablet Take 1 tablet (1,000 mcg total) by mouth daily. Can get any over-the-counter Vitamin B-12 supplement.   famotidine 40 MG tablet Commonly known as: PEPCID Take 40 mg by mouth daily.   ferrous sulfate 325 (65 FE) MG tablet Take 1 tablet (325 mg total) by mouth daily with breakfast. Can get any over-the-counter iron supplement.   One-A-Day Womens Formula Tabs Take 1 tablet by mouth daily.   pantoprazole 40 MG tablet Commonly known as: Protonix Take 1 tablet (40 mg total) by mouth 2 (two) times daily.  Follow-up Information    Gladstone Lighter, MD Follow up in 1 week(s).   Specialty: Internal Medicine Contact information: [place] [address] [phone]        Lin Landsman, MD Follow up in 1 week(s).   Specialty: Gastroenterology Contact information: [place] [address] [phone]               Signed: Sharen Hones 02/09/2021, 5:33 PM

## 2021-02-09 NOTE — Progress Notes (Signed)
Paige Robertson to be D/C'd home per MD order.  Discussed prescriptions and follow up appointments with the patient. Prescriptions given to patient, medication list explained in detail. Pt verbalized understanding.  Allergies as of 02/09/2021       Reactions   Feraheme [ferumoxytol] Anaphylaxis   Sulfa Antibiotics Other (See Comments)   Childhood allergy, unknown reaction        Medication List     TAKE these medications    cetirizine 10 MG tablet Commonly known as: ZYRTEC Take 10 mg by mouth daily.   cyanocobalamin 1000 MCG tablet Take 1 tablet (1,000 mcg total) by mouth daily. Can get any over-the-counter Vitamin B-12 supplement.   famotidine 40 MG tablet Commonly known as: PEPCID Take 40 mg by mouth daily.   ferrous sulfate 325 (65 FE) MG tablet Take 1 tablet (325 mg total) by mouth daily with breakfast. Can get any over-the-counter iron supplement.   One-A-Day Womens Formula Tabs Take 1 tablet by mouth daily.   pantoprazole 40 MG tablet Commonly known as: Protonix Take 1 tablet (40 mg total) by mouth 2 (two) times daily.        Vitals:   02/09/21 1400 02/09/21 1614  BP: (!) 86/52 (!) 89/57  Pulse: 82 76  Resp: 20 20  Temp: 98 F (36.7 C) 98.5 F (36.9 C)  SpO2: 98% 99%    Skin clean, dry and intact without evidence of skin break down, no evidence of skin tears noted. IV catheter discontinued intact. Site without signs and symptoms of complications. Dressing and pressure applied. Pt denies pain at this time. No complaints noted.  An After Visit Summary was printed and given to the patient. Patient escorted via [place], and D/C home via private auto.  [place] A Domingos Riggi

## 2021-02-09 NOTE — Progress Notes (Signed)
PROGRESS NOTE    Paige Robertson  RCV:[id number] DOB: [birth year] DOA: 02/07/2021 PCP: Gladstone Lighter, MD    Brief Narrative:  Paige Robertson a 45 y.o.femalewith medical history significant ofGIB,angiodysplasia of duodenum,HLD, GERD, IDA, presents withlightheadedness.  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  She was hypotensive when she arrived in the hospital, she received fluids bolus.  She also received blood transfusion.  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  Her bleeding seems to be stopped.  However, due to high risk of recurrence of bleeding, she will be kept in the hospital for observation for another day.   Assessment & Plan:   Principal Problem:   GI bleeding Active Problems:   Iron deficiency anemia   Acid reflux   Angiodysplasia of duodenum   Symptomatic anemia   Leukocytosis   Acute blood loss anemia  #1. Acute blood loss anemia secondary to upper GI bleed. 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 Iron deficient anemia. Status post EGD and argon laser and clip treatment. Patient was given 1 unit PRBC yesterday, she also received IV iron today with iron sucrose, he did not have any reaction to it. Discussed with GI, patient hemoglobin was still not going up, will keep patient overnight for capsule endoscopy tomorrow.  Recheck a CBC tomorrow. Patient is also on oral B12 for borderline B12 level.  2.  Allergic reaction to Feraheme. No additional allergy to IV iron.    DVT prophylaxis: SCDs Code Status: Full Family Communication: Husband updated at bedside Disposition Plan:  .   Status is: Inpatient  Remains inpatient appropriate because:Inpatient level of care appropriate due to severity of illness   Dispo: The patient is from: Home              Anticipated d/c is to: Home              Patient currently is not medically stable to d/c.   Difficult to place patient No        I/O last 3  completed shifts: In: 3294.3 [P.O.:1580; I.V.:1344.3; Blood:370] Out: 0  Total I/O In: 63 [P.O.:740] Out: -      Consultants:   GI  Procedures: EGD  Antimicrobials: None  Subjective: Patient feels well today, no additional black stools.  No nausea vomiting or abdominal pain. No short of breath or cough. No chest pain or palpitation. No fever or chills.  Objective: Vitals:   02/09/21 0827 02/09/21 1155 02/09/21 1400 02/09/21 1614  BP: (!) 89/48 (!) 91/53 (!) 86/52 (!) 89/57  Pulse: 74 84 82 76  Resp:   20 20  Temp: 98.1 F (36.7 C) 98.8 F (37.1 C) 98 F (36.7 C) 98.5 F (36.9 C)  TempSrc: Oral     SpO2: 99% 99% 98% 99%  Weight:      Height:        Intake/Output Summary (Last 24 hours) at 02/09/2021 1623 Last data filed at 02/09/2021 1415 Gross per 24 hour  Intake 1260 ml  Output --  Net 1260 ml   Filed Weights   02/07/21 0830 02/07/21 1445  Weight: 58.1 kg 58.1 kg    Examination:  General exam: Appears calm and comfortable  Respiratory system: Clear to auscultation. Respiratory effort normal. Cardiovascular system: S1 & S2 heard, RRR. No JVD, murmurs, rubs, gallops or clicks. No pedal edema. Gastrointestinal system: Abdomen is nondistended, soft and nontender. No organomegaly or masses felt. Normal bowel sounds heard.  Central nervous system: Alert and oriented. No focal neurological deficits. Extremities: Symmetric 5 x 5 power. Skin: No rashes, lesions or ulcers Psychiatry: Judgement and insight appear normal. Mood & affect appropriate.     Data Reviewed: I have personally reviewed following labs and imaging studies  CBC: Recent Labs  Lab 02/07/21 1427 02/07/21 2144 02/08/21 0352 02/08/21 0922 02/08/21 1837 02/09/21 0453 02/09/21 0858 02/09/21 1554  WBC 10.3 10.2 14.7* 14.8*  --  11.7*  --   --   HGB 8.2* 7.0* 7.3* 7.7* 8.3* 7.2* 7.7* 7.5*  HCT 25.0* 21.3* 22.5* 23.4* 25.4* 22.5*  --   --   MCV 89.3 90.3 90.0 89.7  --  91.1  --   --    PLT 185 143* 155 163  --  141*  --   --    Basic Metabolic Panel: Recent Labs  Lab 02/07/21 0847 02/08/21 0352 02/09/21 0453  NA 136 140 137  K 3.7 3.9 3.6  CL 105 113* 109  CO2 23 22 23   GLUCOSE 161* 125* 91  BUN 24* 9 20  CREATININE 0.58 0.52 0.60  CALCIUM 9.2 8.7* 8.5*  MG  --   --  2.1   GFR: Estimated Creatinine Clearance: 71 mL/min (by C-G formula based on SCr of 0.6 mg/dL). Liver Function Tests: Recent Labs  Lab 02/07/21 0847  AST 35  ALT 51*  ALKPHOS 56  BILITOT 0.4  PROT 7.0  ALBUMIN 4.3   No results for input(s): LIPASE, AMYLASE in the last 168 hours. No results for input(s): AMMONIA in the last 168 hours. Coagulation Profile: Recent Labs  Lab 02/07/21 1427  INR 1.0   Cardiac Enzymes: No results for input(s): CKTOTAL, CKMB, CKMBINDEX, TROPONINI in the last 168 hours. BNP (last 3 results) No results for input(s): PROBNP in the last 8760 hours. HbA1C: No results for input(s): HGBA1C in the last 72 hours. CBG: Recent Labs  Lab 02/07/21 1846 02/08/21 0754 02/09/21 0752 02/09/21 1146  GLUCAP 145* 99 86 84   Lipid Profile: No results for input(s): CHOL, HDL, LDLCALC, TRIG, CHOLHDL, LDLDIRECT in the last 72 hours. Thyroid Function Tests: No results for input(s): TSH, T4TOTAL, FREET4, T3FREE, THYROIDAB in the last 72 hours. Anemia Panel: Recent Labs    02/07/21 0847 02/07/21 1421 02/08/21 0742 02/08/21 0922  VITAMINB12  --  246 255  --   FOLATE 13.5  --   --   --   FERRITIN 50  --   --   --   TIBC 382  --   --  297  IRON 91  --   --  49   Sepsis Labs: No results for input(s): PROCALCITON, LATICACIDVEN in the last 168 hours.  Recent Results (from the past 240 hour(s))  Resp Panel by RT-PCR (Flu A&B, Covid) Nasopharyngeal Swab     Status: None   Collection Time: 02/07/21  8:47 AM   Specimen: Nasopharyngeal Swab; Nasopharyngeal(NP) swabs in vial transport medium  Result Value Ref Range Status   SARS Coronavirus 2 by RT PCR NEGATIVE  NEGATIVE Final    Comment: (NOTE) SARS-CoV-2 target nucleic acids are NOT DETECTED.  The SARS-CoV-2 RNA is generally detectable in upper respiratory specimens during the acute phase of infection. The lowest concentration of SARS-CoV-2 viral copies this assay can detect is 138 copies/mL. A negative result does not preclude SARS-Cov-2 infection and should not be used as the sole basis for treatment or other patient management decisions. A negative result may occur with  improper specimen collection/handling, submission of specimen other than nasopharyngeal swab, presence of viral mutation(s) within the areas targeted by this assay, and inadequate number of viral copies(<138 copies/mL). A negative result must be combined with clinical observations, patient history, and epidemiological information. The expected result is Negative.  Fact Sheet for Patients:  EntrepreneurPulse.com.au  Fact Sheet for Healthcare Providers:  IncredibleEmployment.be  This test is no t yet approved or cleared by the [place] FDA and  has been authorized for detection and/or diagnosis of SARS-CoV-2 by FDA under an Emergency Use Authorization (EUA). This EUA will remain  in effect (meaning this test can be used) for the duration of the COVID-19 declaration under Section 564(b)(1) of the Act, 21 U.S.C.section 360bbb-3(b)(1), unless the authorization is terminated  or revoked sooner.       Influenza A by PCR NEGATIVE NEGATIVE Final   Influenza B by PCR NEGATIVE NEGATIVE Final    Comment: (NOTE) The Xpert Xpress SARS-CoV-2/FLU/RSV plus assay is intended as an aid in the diagnosis of influenza from Nasopharyngeal swab specimens and should not be used as a sole basis for treatment. Nasal washings and aspirates are unacceptable for Xpert Xpress SARS-CoV-2/FLU/RSV testing.  Fact Sheet for Patients: EntrepreneurPulse.com.au  Fact Sheet for Healthcare  Providers: IncredibleEmployment.be  This test is not yet approved or cleared by the [place] FDA and has been authorized for detection and/or diagnosis of SARS-CoV-2 by FDA under an Emergency Use Authorization (EUA). This EUA will remain in effect (meaning this test can be used) for the duration of the COVID-19 declaration under Section 564(b)(1) of the Act, 21 U.S.C. section 360bbb-3(b)(1), unless the authorization is terminated or revoked.  Performed at [facility], [address], [place], [place] [address]          Radiology Studies: No results found.      Scheduled Meds: . ferrous sulfate  325 mg Oral Q breakfast  . loratadine  10 mg Oral Daily  . multivitamin with minerals  1 tablet Oral Daily  . vitamin B-12  1,000 mcg Oral Daily   Continuous Infusions: . pantoprozole (PROTONIX) infusion 8 mg/hr (02/09/21 1029)     LOS: 1 day    Time spent: 28 minutes    Sharen Hones, MD [facility]   To contact the attending provider between 7A-7P or the covering provider during after hours 7P-7A, please log into the web site [email or web address] and access using universal [place] password for that web site. If you do not have the password, please call the hospital operator.  02/09/2021, 4:23 PM

## 2021-02-11 ENCOUNTER — Encounter: Payer: Self-pay | Admitting: Gastroenterology

## 2021-02-11 ENCOUNTER — Inpatient Hospital Stay (HOSPITAL_COMMUNITY)
Admission: EM | Admit: 2021-02-11 | Discharge: 2021-02-22 | DRG: 329 | Disposition: A | Discharge status: Home / Self Care | Payer: BC Managed Care – PPO | Attending: General Surgery | Admitting: General Surgery

## 2021-02-11 ENCOUNTER — Other Ambulatory Visit: Payer: Self-pay

## 2021-02-11 ENCOUNTER — Telehealth: Payer: Self-pay

## 2021-02-11 DIAGNOSIS — Z888 Allergy status to other drugs, medicaments and biological substances status: Secondary | ICD-10-CM

## 2021-02-11 DIAGNOSIS — N736 Female pelvic peritoneal adhesions (postinfective): Secondary | ICD-10-CM | POA: Diagnosis present

## 2021-02-11 DIAGNOSIS — K219 Gastro-esophageal reflux disease without esophagitis: Secondary | ICD-10-CM | POA: Diagnosis present

## 2021-02-11 DIAGNOSIS — R55 Syncope and collapse: Secondary | ICD-10-CM | POA: Diagnosis not present

## 2021-02-11 DIAGNOSIS — C49A3 Gastrointestinal stromal tumor of small intestine: Principal | ICD-10-CM | POA: Diagnosis present

## 2021-02-11 DIAGNOSIS — Z83438 Family history of other disorder of lipoprotein metabolism and other lipidemia: Secondary | ICD-10-CM

## 2021-02-11 DIAGNOSIS — E440 Moderate protein-calorie malnutrition: Secondary | ICD-10-CM | POA: Insufficient documentation

## 2021-02-11 DIAGNOSIS — K922 Gastrointestinal hemorrhage, unspecified: Secondary | ICD-10-CM | POA: Diagnosis present

## 2021-02-11 DIAGNOSIS — N898 Other specified noninflammatory disorders of vagina: Secondary | ICD-10-CM | POA: Diagnosis present

## 2021-02-11 DIAGNOSIS — K289 Gastrojejunal ulcer, unspecified as acute or chronic, without hemorrhage or perforation: Secondary | ICD-10-CM | POA: Diagnosis present

## 2021-02-11 DIAGNOSIS — E785 Hyperlipidemia, unspecified: Secondary | ICD-10-CM | POA: Diagnosis present

## 2021-02-11 DIAGNOSIS — Z79899 Other long term (current) drug therapy: Secondary | ICD-10-CM

## 2021-02-11 DIAGNOSIS — Z882 Allergy status to sulfonamides status: Secondary | ICD-10-CM

## 2021-02-11 DIAGNOSIS — K31811 Angiodysplasia of stomach and duodenum with bleeding: Secondary | ICD-10-CM | POA: Diagnosis present

## 2021-02-11 DIAGNOSIS — D62 Acute posthemorrhagic anemia: Secondary | ICD-10-CM | POA: Diagnosis present

## 2021-02-11 DIAGNOSIS — F419 Anxiety disorder, unspecified: Secondary | ICD-10-CM | POA: Diagnosis present

## 2021-02-11 DIAGNOSIS — Z20822 Contact with and (suspected) exposure to covid-19: Secondary | ICD-10-CM | POA: Diagnosis present

## 2021-02-11 DIAGNOSIS — Z6823 Body mass index (BMI) 23.0-23.9, adult: Secondary | ICD-10-CM

## 2021-02-11 DIAGNOSIS — D500 Iron deficiency anemia secondary to blood loss (chronic): Secondary | ICD-10-CM

## 2021-02-11 DIAGNOSIS — K668 Other specified disorders of peritoneum: Secondary | ICD-10-CM

## 2021-02-11 LAB — CBC WITH DIFFERENTIAL/PLATELET
Abs Immature Granulocytes: 0.11 10*3/uL — ABNORMAL HIGH (ref 0.00–0.07)
Basophils Absolute: 0 10*3/uL (ref 0.0–0.1)
Basophils Relative: 0 %
Eosinophils Absolute: 0.1 10*3/uL (ref 0.0–0.5)
Eosinophils Relative: 1 %
HCT: 24.3 % — ABNORMAL LOW (ref 36.0–46.0)
Hemoglobin: 7.6 g/dL — ABNORMAL LOW (ref 12.0–15.0)
Immature Granulocytes: 1 %
Lymphocytes Relative: 21 %
Lymphs Abs: 2.5 10*3/uL (ref 0.7–4.0)
MCH: 30.5 pg (ref 26.0–34.0)
MCHC: 31.3 g/dL (ref 30.0–36.0)
MCV: 97.6 fL (ref 80.0–100.0)
Monocytes Absolute: 0.6 10*3/uL (ref 0.1–1.0)
Monocytes Relative: 5 %
Neutro Abs: 8.7 10*3/uL — ABNORMAL HIGH (ref 1.7–7.7)
Neutrophils Relative %: 72 %
Platelets: 249 10*3/uL (ref 150–400)
RBC: 2.49 MIL/uL — ABNORMAL LOW (ref 3.87–5.11)
RDW: 20.5 % — ABNORMAL HIGH (ref 11.5–15.5)
WBC: 12.1 10*3/uL — ABNORMAL HIGH (ref 4.0–10.5)
nRBC: 0.9 % — ABNORMAL HIGH (ref 0.0–0.2)

## 2021-02-11 LAB — COMPREHENSIVE METABOLIC PANEL
ALT: 61 U/L — ABNORMAL HIGH (ref 0–44)
AST: 39 U/L (ref 15–41)
Albumin: 3.4 g/dL — ABNORMAL LOW (ref 3.5–5.0)
Alkaline Phosphatase: 68 U/L (ref 38–126)
Anion gap: 7 (ref 5–15)
BUN: 9 mg/dL (ref 6–20)
CO2: 24 mmol/L (ref 22–32)
Calcium: 8.6 mg/dL — ABNORMAL LOW (ref 8.9–10.3)
Chloride: 106 mmol/L (ref 98–111)
Creatinine, Ser: 0.57 mg/dL (ref 0.44–1.00)
GFR, Estimated: >60 mL/min (ref >60)
Glucose, Bld: 100 mg/dL — ABNORMAL HIGH (ref 70–99)
Potassium: 3.3 mmol/L — ABNORMAL LOW (ref 3.5–5.1)
Sodium: 137 mmol/L (ref 135–145)
Total Bilirubin: 0.6 mg/dL (ref 0.3–1.2)
Total Protein: 5.8 g/dL — ABNORMAL LOW (ref 6.5–8.1)

## 2021-02-11 LAB — I-STAT BETA HCG BLOOD, ED (MC, WL, AP ONLY): I-stat hCG, quantitative: 6.5 m[IU]/mL — ABNORMAL HIGH (ref <5)

## 2021-02-11 LAB — CBG MONITORING, ED: Glucose-Capillary: 100 mg/dL — ABNORMAL HIGH (ref 70–99)

## 2021-02-11 MED ORDER — SODIUM CHLORIDE 0.9 % IV BOLUS
1000.0000 mL | Freq: Once | INTRAVENOUS | Status: AC
  Administered 2021-02-11: 1000 mL via INTRAVENOUS

## 2021-02-11 MED ORDER — ACETAMINOPHEN 325 MG PO TABS
650.0000 mg | ORAL_TABLET | Freq: Once | ORAL | Status: AC
  Administered 2021-02-11: 650 mg via ORAL
  Filled 2021-02-11: qty 2

## 2021-02-11 NOTE — Addendum Note (Signed)
Addended by: Ulyess Blossom L on: 02/11/2021 09:47 AM   Modules accepted: Orders

## 2021-02-11 NOTE — Telephone Encounter (Signed)
Called patient and made appointment for 03/11/2021. She states that Dr. Vicente Males recommended her to have lab work done this week. Please advised what lab work she needs to have done

## 2021-02-11 NOTE — ED Notes (Signed)
Pt ambulated to and from the restroom.

## 2021-02-11 NOTE — H&P (Signed)
History and Physical    Paige Robertson PZW:[id number] DOB: [birth year] DOA: 02/11/2021  PCP: Gladstone Lighter, MD  Patient coming from: Home via EMS  I have personally briefly reviewed patient's old medical records in [place]  Chief Complaint: Near syncope  HPI: Paige Robertson is a 45 y.o. female with medical history significant for history of GI bleeding and iron deficiency anemia due to angiodysplasia of the duodenum, GERD who presents to the ED for evaluation of a near syncopal episode.  Patient was recently admitted at [facility] from 02/07/2021-02/09/2021 for acute blood loss anemia due to duodenal AVM GI bleed.  Patient underwent small bowel enteroscopy on 02/07/2021 and underwent argon laser treatment and clipping by Dr. Marius Ditch.  She was also transfused 1 unit PRBC and IV iron sucrose during admission.  Hemoglobin was stable at 7.5 on day of discharge.  She was started on Protonix 40 mg twice daily which she says she took her first dose this morning.  Patient states she went to a softball game earlier today (02/11/2021).  She was sitting down for about 15-20 minutes when she felt her heart racing, became lightheaded, and diaphoretic.  She says she blacked out and when she awoke her family were calling 911.  Family noted that her eyes were dilated, she appeared very confused and lethargic with slurred speech and pallor.  This episode lasted several minutes.  She has not seen any obvious bleeding.  She says this episode felt similar to the symptoms she experienced on her recent admission.  She has been avoiding blood thinners and NSAIDs.  ED Course:  Initial vitals showed BP 102/71, pulse 100, RR 23, temp 98.6 F, SPO2 100% on room air.  Labs show hemoglobin 7.6, WBC 12.1, platelets 249,000, sodium 137, potassium 3.3, bicarb 24, BUN 9, creatinine 0.57, AST 39, ALT 61, alk phos 68, total bilirubin 0.6, i-STAT beta-hCG 6.5, SARS-CoV-2 PCR collected and pending.  EDP discussed with on-call  GI at [facility] who recommended observing overnight for recurrent bleeding.  The hospitalist service was consulted to admit for further evaluation and management.  Review of Systems: All systems reviewed and are negative except as documented in history of present illness above.   Past Medical History:  Diagnosis Date  . Acid reflux   . Hematuria   . Hyperlipemia   . IDA (iron deficiency anemia)   . Melena   . Perimenopausal symptoms   . Vaginal discharge     Past Surgical History:  Procedure Laterality Date  . COLONOSCOPY WITH ESOPHAGOGASTRODUODENOSCOPY (EGD)    . ENTEROSCOPY N/A 07/09/2020   Procedure: ENTEROSCOPY;  Surgeon: Lin Landsman, MD;  Location: [facility] ENDOSCOPY;  Service: Gastroenterology;  Laterality: N/A;  . ENTEROSCOPY N/A 02/07/2021   Procedure: ENTEROSCOPY;  Surgeon: Lin Landsman, MD;  Location[facility] ENDOSCOPY;  Service: Gastroenterology;  Laterality: N/A;  . ESOPHAGOGASTRODUODENOSCOPY (EGD) WITH PROPOFOL N/A 07/06/2020   Procedure: ESOPHAGOGASTRODUODENOSCOPY (EGD) WITH PROPOFOL;  Surgeon: Lucilla Lame, MD;  Location: [facility] ENDOSCOPY;  Service: Endoscopy;  Laterality: N/A;  . ESOPHAGOGASTRODUODENOSCOPY (EGD) WITH PROPOFOL N/A 11/19/2020   Procedure: ESOPHAGOGASTRODUODENOSCOPY (EGD) WITH PROPOFOL;  Surgeon: Tol[place], Benay Pike, MD;  Location: [facility] ENDOSCOPY;  Service: Gastroenterology;  Laterality: N/A;  . GIVENS CAPSULE STUDY N/A 07/08/2020   Procedure: GIVENS CAPSULE STUDY;  Surgeon: Virgel Manifold, MD;  Location: [facility] ENDOSCOPY;  Service: Endoscopy;  Laterality: N/A;  Administer between 6-8 pm today  . NO PAST SURGERIES      Social History:  reports that she has  never smoked. She has never used smokeless tobacco. She reports that she does not drink alcohol and does not use drugs.  Allergies  Allergen Reactions  . Feraheme [Ferumoxytol] Anaphylaxis  . Sulfa Antibiotics Other (See Comments)    Childhood allergy, unknown reaction    Family History  Problem  Relation Age of Onset  . Diabetes Father   . Stroke Father   . Breast cancer Maternal Grandmother   . Breast cancer Paternal Aunt   . High Cholesterol Mother   . Colon cancer Neg Hx   . Ovarian cancer Neg Hx   . Heart disease Neg Hx      Prior to Admission medications   Medication Sig Start Date End Date Taking? Authorizing Provider  cetirizine (ZYRTEC) 10 MG tablet Take 10 mg by mouth daily.    [provider]  famotidine (PEPCID) 40 MG tablet Take 40 mg by mouth daily. 08/15/20   [provider]  ferrous sulfate 325 (65 FE) MG tablet Take 1 tablet (325 mg total) by mouth daily with breakfast. Can get any over-the-counter iron supplement. 07/09/20   Enzo Bi, MD  Multiple Vitamins-Calcium (ONE-A-DAY WOMENS FORMULA) TABS Take 1 tablet by mouth daily. 01/31/20   [provider]  pantoprazole (PROTONIX) 40 MG tablet Take 1 tablet (40 mg total) by mouth 2 (two) times daily. 02/09/21 03/11/21  Sharen Hones, MD  vitamin B-12 1000 MCG tablet Take 1 tablet (1,000 mcg total) by mouth daily. Can get any over-the-counter Vitamin B-12 supplement. 07/10/20   Enzo Bi, MD    Physical Exam: Vitals:   02/11/21 2030 02/11/21 2100 02/11/21 2115 02/11/21 2130  BP: 108/68 107/72 106/63 101/63  Pulse: 92 89 95 88  Resp: 19 19 17  (!) 22  Temp:      TempSrc:      SpO2: 100% 100% 100% 100%  Weight:      Height:       Constitutional: Resting supine in bed, NAD, calm, comfortable Eyes: PERRL, lids and conjunctivae normal ENMT: Mucous membranes are moist. Posterior pharynx clear of any exudate or lesions.Normal dentition.  Neck: normal, supple, no masses. Respiratory: clear to auscultation bilaterally, no wheezing, no crackles. Normal respiratory effort. No accessory muscle use.  Cardiovascular: Regular rate and rhythm, no murmurs / rubs / gallops. No extremity edema. 2+ pedal pulses. Abdomen: no tenderness, no masses palpated. Bowel sounds positive.  Musculoskeletal: no clubbing  / cyanosis. No joint deformity upper and lower extremities. Good ROM, no contractures. Normal muscle tone.  Skin: no rashes, lesions, ulcers. No induration Neurologic: CN 2-12 grossly intact. Sensation intact. Strength 5/5 in all 4.  Psychiatric: Normal judgment and insight. Alert and oriented x 3. Normal mood.   Labs on Admission: I have personally reviewed following labs and imaging studies  CBC: Recent Labs  Lab 02/07/21 2144 02/08/21 0352 02/08/21 0922 02/08/21 1837 02/09/21 0453 02/09/21 0858 02/09/21 1554 02/11/21 1958  WBC 10.2 14.7* 14.8*  --  11.7*  --   --  12.1*  NEUTROABS  --   --   --   --   --   --   --  8.7*  HGB 7.0* 7.3* 7.7* 8.3* 7.2* 7.7* 7.5* 7.6*  HCT 21.3* 22.5* 23.4* 25.4* 22.5*  --   --  24.3*  MCV 90.3 90.0 89.7  --  91.1  --   --  97.6  PLT 143* 155 163  --  141*  --   --  173   Basic Metabolic Panel:  Recent Labs  Lab 02/07/21 0847 02/08/21 0352 02/09/21 0453 02/11/21 1958  NA 136 140 137 137  K 3.7 3.9 3.6 3.3*  CL 105 113* 109 106  CO2 23 22 23 24   GLUCOSE 161* 125* 91 100*  BUN 24* 9 20 9   CREATININE 0.58 0.52 0.60 0.57  CALCIUM 9.2 8.7* 8.5* 8.6*  MG  --   --  2.1  --    GFR: Estimated Creatinine Clearance: 71 mL/min (by C-G formula based on SCr of 0.57 mg/dL). Liver Function Tests: Recent Labs  Lab 02/07/21 0847 02/11/21 1958  AST 35 39  ALT 51* 61*  ALKPHOS 56 68  BILITOT 0.4 0.6  PROT 7.0 5.8*  ALBUMIN 4.3 3.4*   No results for input(s): LIPASE, AMYLASE in the last 168 hours. No results for input(s): AMMONIA in the last 168 hours. Coagulation Profile: Recent Labs  Lab 02/07/21 1427  INR 1.0   Cardiac Enzymes: No results for input(s): CKTOTAL, CKMB, CKMBINDEX, TROPONINI in the last 168 hours. BNP (last 3 results) No results for input(s): PROBNP in the last 8760 hours. HbA1C: No results for input(s): HGBA1C in the last 72 hours. CBG: Recent Labs  Lab 02/07/21 1846 02/08/21 0754 02/09/21 0752 02/09/21 1146  02/11/21 1947  GLUCAP 145* 99 86 84 100*   Lipid Profile: No results for input(s): CHOL, HDL, LDLCALC, TRIG, CHOLHDL, LDLDIRECT in the last 72 hours. Thyroid Function Tests: No results for input(s): TSH, T4TOTAL, FREET4, T3FREE, THYROIDAB in the last 72 hours. Anemia Panel: No results for input(s): VITAMINB12, FOLATE, FERRITIN, TIBC, IRON, RETICCTPCT in the last 72 hours. Urine analysis:    Component Value Date/Time   COLORURINE YELLOW (A) 02/07/2021 0847   APPEARANCEUR HAZY (A) 02/07/2021 0847   LABSPEC 1.025 02/07/2021 0847   PHURINE 5.0 02/07/2021 [place] 02/07/2021 0847   HGBUR NEGATIVE 02/07/2021 0847   BILIRUBINUR NEGATIVE 02/07/2021 0847   [place] 02/07/2021 0847   PROTEINUR NEGATIVE 02/07/2021 0847   NITRITE NEGATIVE 02/07/2021 0847   LEUKOCYTESUR TRACE (A) 02/07/2021 0847    Radiological Exams on Admission: No results found.  EKG: Personally reviewed. Normal sinus rhythm without acute ischemic changes.  Assessment/Plan Principal Problem:   Near syncope Active Problems:   GI bleeding   Farris Blash is a 45 y.o. female with medical history significant for history of GI bleeding and iron deficiency anemia due to angiodysplasia of the duodenum, GERD who is admitted with near syncope/syncopal episode.  Near-syncope/syncopal episode Recent admission for acute blood loss anemia due to duodenal AVM bleeding: Recent history concerning for recurrent GI bleeding.  Hemoglobin on arrival stable compared to 2 days prior to this admission.  Has not seen any obvious recurrent bleeding. -Place on IV Protonix 40 mg twice daily -Repeat H&H now followed by CBC in the morning -Transfuse with PRBC if hemoglobin <7.0 -Monitor on telemetry  DVT prophylaxis: SCDs Code Status: Full code, confirmed with patient Family Communication: Discussed with patient, she has discussed with family Disposition Plan: From home and likely discharge to home pending  clinical progress Consults called: None Level of care: Telemetry Medical Admission status:  Status is: Observation  The patient remains OBS appropriate and will d/c before 2 midnights.  Dispo: The patient is from: Home              Anticipated d/c is to: Home              Patient currently is not medically stable to d/c.  Difficult to place patient No  Zada Finders MD [facility]  If 7PM-7AM, please contact night-coverage [email or web address]  02/11/2021, 11:56 PM

## 2021-02-11 NOTE — Telephone Encounter (Signed)
Patient verbalized understanding and  Will go get lab work

## 2021-02-11 NOTE — Telephone Encounter (Signed)
-----   Message from Lin Landsman, MD sent at 02/08/2021  1:53 PM EST ----- Regarding: Hospital follow-up, iron deficiency anemia Caryl Pina  Please schedule a follow-for this patient to see me in 3-4weeks, okay to overbook  Dx: Small bowel AVMs  Thanks RV

## 2021-02-11 NOTE — Telephone Encounter (Signed)
Please order CBC  Her copper levels are low. Advice to take OTC copper supplements 1 a day  RV

## 2021-02-11 NOTE — ED Provider Notes (Signed)
[facility] EMERGENCY DEPARTMENT Provider Note   CSN: [id number] Arrival date & time: 02/11/21  1924     History Chief Complaint  Patient presents with  . Syncope     Paige Robertson is a 45 y.o. female.  The history is provided by the patient, medical records and a significant other.   Paige Robertson is a 45 y.o. female who presents to the Emergency Department complaining of syncope.  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  She was discharged home on Saturday.  Today she went to a ball game and was able to walk to the seat and after sitting for about 15 minutes and she slumped over in the chair.  Her eyes were dilated, very confused, lethargic with slurred speech and pallor.  Lasted several minutes.    Denies chest pain, sob.  Feels like she is breathing quickly.  Has nausea.  No bloody stools.  Had a formed stool at home yesterday.  Was started on protonix during recent hospitalization - compliant with meds.    During recent hospitalization she received iron infusion, 1 unit prbc and was discharged home with hgb 7.5.    Today's episode is similar to what occurred requiring her recent admission.        Past Medical History:  Diagnosis Date  . Acid reflux   . Hematuria   . Hyperlipemia   . IDA (iron deficiency anemia)   . Melena   . Perimenopausal symptoms   . Vaginal discharge     Patient Active Problem List   Diagnosis Date Noted  . Near syncope 02/11/2021  . Acute blood loss anemia 02/08/2021  . Symptomatic anemia 02/07/2021  . GI bleeding 02/07/2021  . Leukocytosis 02/07/2021  . Acid reflux   . Angiodysplasia of duodenum   . Iron deficiency anemia 01/05/2020  . Hypopigmentation 05/26/2018  . SUI (stress urinary incontinence, female) 05/06/2017    Past Surgical History:  Procedure Laterality Date  . COLONOSCOPY WITH ESOPHAGOGASTRODUODENOSCOPY (EGD)    . ENTEROSCOPY N/A 07/09/2020    Procedure: ENTEROSCOPY;  Surgeon: Lin Landsman, MD;  Location: [facility] ENDOSCOPY;  Service: Gastroenterology;  Laterality: N/A;  . ENTEROSCOPY N/A 02/07/2021   Procedure: ENTEROSCOPY;  Surgeon: Lin Landsman, MD;  Location: [facility] ENDOSCOPY;  Service: Gastroenterology;  Laterality: N/A;  . ESOPHAGOGASTRODUODENOSCOPY (EGD) WITH PROPOFOL N/A 07/06/2020   Procedure: ESOPHAGOGASTRODUODENOSCOPY (EGD) WITH PROPOFOL;  Surgeon: Lucilla Lame, MD;  Location: [facility] ENDOSCOPY;  Service: Endoscopy;  Laterality: N/A;  . ESOPHAGOGASTRODUODENOSCOPY (EGD) WITH PROPOFOL N/A 11/19/2020   Procedure: ESOPHAGOGASTRODUODENOSCOPY (EGD) WITH PROPOFOL;  Surgeon: Toledo, Benay Pike, MD;  Location: [facility] ENDOSCOPY;  Service: Gastroenterology;  Laterality: N/A;  . GIVENS CAPSULE STUDY N/A 07/08/2020   Procedure: GIVENS CAPSULE STUDY;  Surgeon: Virgel Manifold, MD;  Location: [facility] ENDOSCOPY;  Service: Endoscopy;  Laterality: N/A;  Administer between 6-8 pm today  . NO PAST SURGERIES       OB History    Gravida  2   Para  2   Term  2   Preterm      AB      Living  2     SAB      IAB      Ectopic      Multiple      Live Births  2           Family History  Problem Relation Age of Onset  .  Diabetes Father   . Stroke Father   . Breast cancer Maternal Grandmother   . Breast cancer Paternal Aunt   . High Cholesterol Mother   . Colon cancer Neg Hx   . Ovarian cancer Neg Hx   . Heart disease Neg Hx     Social History   Tobacco Use  . Smoking status: Never Smoker  . Smokeless tobacco: Never Used  Vaping Use  . Vaping Use: Never used  Substance Use Topics  . Alcohol use: No  . Drug use: No    Home Medications Prior to Admission medications   Medication Sig Start Date End Date Taking? Authorizing Provider  cetirizine (ZYRTEC) 10 MG tablet Take 10 mg by mouth daily.    [provider]  famotidine (PEPCID) 40 MG tablet Take 40 mg by mouth daily. 08/15/20   [provider]  ferrous sulfate 325 (65 FE) MG tablet Take 1 tablet (325 mg total) by mouth daily with breakfast. Can get any over-the-counter iron supplement. 07/09/20   Enzo Bi, MD  Multiple Vitamins-Calcium (ONE-A-DAY WOMENS FORMULA) TABS Take 1 tablet by mouth daily. 01/31/20   [provider]  pantoprazole (PROTONIX) 40 MG tablet Take 1 tablet (40 mg total) by mouth 2 (two) times daily. 02/09/21 03/11/21  Sharen Hones, MD  vitamin B-12 1000 MCG tablet Take 1 tablet (1,000 mcg total) by mouth daily. Can get any over-the-counter Vitamin B-12 supplement. 07/10/20   Enzo Bi, MD    Allergies    Feraheme [ferumoxytol] and Sulfa antibiotics  Review of Systems   Review of Systems  All other systems reviewed and are negative.   Physical Exam Updated Vital Signs BP 101/63   Pulse 88   Temp 98.6 F (37 C) (Oral)   Resp (!) 22   Ht 5\' 2"  (1.575 m)   Wt 57.2 kg   LMP 01/24/2021 (Approximate)   SpO2 100%   BMI 23.05 kg/m   Physical Exam Vitals and nursing note reviewed.  Constitutional:      Appearance: She is well-developed.     Comments: Appears fatigued  HENT:     Head: Normocephalic and atraumatic.  Cardiovascular:     Rate and Rhythm: Regular rhythm. Tachycardia present.     Heart sounds: No murmur heard.   Pulmonary:     Effort: Pulmonary effort is normal. No respiratory distress.     Breath sounds: Normal breath sounds.  Abdominal:     Palpations: Abdomen is soft.     Tenderness: There is no abdominal tenderness. There is no guarding or rebound.  Musculoskeletal:        General: No tenderness.  Skin:    General: Skin is warm and dry.     Coloration: Skin is pale.  Neurological:     Mental Status: She is alert and oriented to person, place, and time.  Psychiatric:        Behavior: Behavior normal.     ED Results / Procedures / Treatments   Labs (all labs ordered are listed, but only abnormal results are displayed) Labs Reviewed  CBC WITH  DIFFERENTIAL/PLATELET - Abnormal; Notable for the following components:      Result Value   WBC 12.1 (*)    RBC 2.49 (*)    Hemoglobin 7.6 (*)    HCT 24.3 (*)    RDW 20.5 (*)    nRBC 0.9 (*)    Neutro Abs 8.7 (*)    Abs Immature Granulocytes 0.11 (*)  All other components within normal limits  COMPREHENSIVE METABOLIC PANEL - Abnormal; Notable for the following components:   Potassium 3.3 (*)    Glucose, Bld 100 (*)    Calcium 8.6 (*)    Total Protein 5.8 (*)    Albumin 3.4 (*)    ALT 61 (*)    All other components within normal limits  CBG MONITORING, ED - Abnormal; Notable for the following components:   Glucose-Capillary 100 (*)    All other components within normal limits  I-STAT BETA HCG BLOOD, ED (MC, WL, AP ONLY) - Abnormal; Notable for the following components:   I-stat hCG, quantitative 6.5 (*)    All other components within normal limits  SARS CORONAVIRUS 2 (TAT 6-24 HRS)  TYPE AND SCREEN    EKG EKG Interpretation  Date/Time:  Monday February 11 2021 19:48:21 EDT Ventricular Rate:  99 PR Interval:    QRS Duration: 101 QT Interval:  351 QTC Calculation: 451 R Axis:   80 Text Interpretation: Sinus rhythm Confirmed by Quintella Reichert [phone]) on 02/11/2021 7:57:51 PM   Radiology No results found.  Procedures Procedures   Medications Ordered in ED Medications  sodium chloride 0.9 % bolus 1,000 mL (0 mLs Intravenous Stopped 02/11/21 2124)  acetaminophen (TYLENOL) tablet 650 mg (650 mg Oral Given 02/11/21 2014)    ED Course  I have reviewed the triage vital signs and the nursing notes.  Pertinent labs & imaging results that were available during my care of the patient were reviewed by me and considered in my medical decision making (see chart for details).    MDM Rules/Calculators/A&P                         Patient with history of AVM status post recent argon leisure treatment as well is a blood transfusion here for evaluation of near syncopal episode.  Hemoglobin is stable compared to recent hospital discharge. She does feel unwell. Given that she is symptomatic in high risk for recurrent bleeds discussed with Dr. Vicente Males, on-call for [place] [facility] who recommends observation. Hospitalist consulted for admission. Patient updated of findings of studies and she is in agreement with plan.  Final Clinical Impression(s) / ED Diagnoses Final diagnoses:  Near syncope    Rx / DC Orders ED Discharge Orders    None       Quintella Reichert, MD 02/11/21 2356

## 2021-02-11 NOTE — ED Triage Notes (Signed)
BIB [facility] at a softball game. Pt was discharged from hospital on Saturday. Pt states she got hot and dizzy and passed out in the chair. Husband witnessed. Pt is A&Ox4 at this time.   90/60 CBG 114

## 2021-02-12 ENCOUNTER — Encounter (HOSPITAL_COMMUNITY): Payer: Self-pay | Admitting: Internal Medicine

## 2021-02-12 DIAGNOSIS — N736 Female pelvic peritoneal adhesions (postinfective): Secondary | ICD-10-CM | POA: Diagnosis present

## 2021-02-12 DIAGNOSIS — Z6823 Body mass index (BMI) 23.0-23.9, adult: Secondary | ICD-10-CM | POA: Diagnosis not present

## 2021-02-12 DIAGNOSIS — K219 Gastro-esophageal reflux disease without esophagitis: Secondary | ICD-10-CM | POA: Diagnosis present

## 2021-02-12 DIAGNOSIS — Z79899 Other long term (current) drug therapy: Secondary | ICD-10-CM | POA: Diagnosis not present

## 2021-02-12 DIAGNOSIS — D62 Acute posthemorrhagic anemia: Secondary | ICD-10-CM | POA: Diagnosis present

## 2021-02-12 DIAGNOSIS — K31811 Angiodysplasia of stomach and duodenum with bleeding: Secondary | ICD-10-CM | POA: Diagnosis present

## 2021-02-12 DIAGNOSIS — E785 Hyperlipidemia, unspecified: Secondary | ICD-10-CM | POA: Diagnosis present

## 2021-02-12 DIAGNOSIS — E440 Moderate protein-calorie malnutrition: Secondary | ICD-10-CM | POA: Diagnosis present

## 2021-02-12 DIAGNOSIS — Z83438 Family history of other disorder of lipoprotein metabolism and other lipidemia: Secondary | ICD-10-CM | POA: Diagnosis not present

## 2021-02-12 DIAGNOSIS — F419 Anxiety disorder, unspecified: Secondary | ICD-10-CM | POA: Diagnosis present

## 2021-02-12 DIAGNOSIS — R55 Syncope and collapse: Secondary | ICD-10-CM | POA: Diagnosis present

## 2021-02-12 DIAGNOSIS — Z882 Allergy status to sulfonamides status: Secondary | ICD-10-CM | POA: Diagnosis not present

## 2021-02-12 DIAGNOSIS — K289 Gastrojejunal ulcer, unspecified as acute or chronic, without hemorrhage or perforation: Secondary | ICD-10-CM | POA: Diagnosis present

## 2021-02-12 DIAGNOSIS — C49A3 Gastrointestinal stromal tumor of small intestine: Secondary | ICD-10-CM | POA: Diagnosis present

## 2021-02-12 DIAGNOSIS — Z20822 Contact with and (suspected) exposure to covid-19: Secondary | ICD-10-CM | POA: Diagnosis present

## 2021-02-12 DIAGNOSIS — N898 Other specified noninflammatory disorders of vagina: Secondary | ICD-10-CM | POA: Diagnosis present

## 2021-02-12 DIAGNOSIS — Z888 Allergy status to other drugs, medicaments and biological substances status: Secondary | ICD-10-CM | POA: Diagnosis not present

## 2021-02-12 LAB — CBC
HCT: 18.4 % — ABNORMAL LOW (ref 36.0–46.0)
Hemoglobin: 5.8 g/dL — CL (ref 12.0–15.0)
MCH: 31 pg (ref 26.0–34.0)
MCHC: 31.5 g/dL (ref 30.0–36.0)
MCV: 98.4 fL (ref 80.0–100.0)
Platelets: 192 10*3/uL (ref 150–400)
RBC: 1.87 MIL/uL — ABNORMAL LOW (ref 3.87–5.11)
RDW: 21.2 % — ABNORMAL HIGH (ref 11.5–15.5)
WBC: 9.2 10*3/uL (ref 4.0–10.5)
nRBC: 0.5 % — ABNORMAL HIGH (ref 0.0–0.2)

## 2021-02-12 LAB — HEMOGLOBIN AND HEMATOCRIT, BLOOD
HCT: 19.8 % — ABNORMAL LOW (ref 36.0–46.0)
HCT: 29.1 % — ABNORMAL LOW (ref 36.0–46.0)
HCT: 30.1 % — ABNORMAL LOW (ref 36.0–46.0)
Hemoglobin: 6.1 g/dL — CL (ref 12.0–15.0)
Hemoglobin: 9.4 g/dL — ABNORMAL LOW (ref 12.0–15.0)
Hemoglobin: 9.8 g/dL — ABNORMAL LOW (ref 12.0–15.0)

## 2021-02-12 LAB — PREPARE RBC (CROSSMATCH)

## 2021-02-12 LAB — BASIC METABOLIC PANEL
Anion gap: 3 — ABNORMAL LOW (ref 5–15)
BUN: 10 mg/dL (ref 6–20)
CO2: 24 mmol/L (ref 22–32)
Calcium: 8.5 mg/dL — ABNORMAL LOW (ref 8.9–10.3)
Chloride: 112 mmol/L — ABNORMAL HIGH (ref 98–111)
Creatinine, Ser: 0.51 mg/dL (ref 0.44–1.00)
GFR, Estimated: >60 mL/min (ref >60)
Glucose, Bld: 95 mg/dL (ref 70–99)
Potassium: 4 mmol/L (ref 3.5–5.1)
Sodium: 139 mmol/L (ref 135–145)

## 2021-02-12 LAB — GLUCOSE, CAPILLARY
Glucose-Capillary: 114 mg/dL — ABNORMAL HIGH (ref 70–99)
Glucose-Capillary: 60 mg/dL — ABNORMAL LOW (ref 70–99)

## 2021-02-12 LAB — SARS CORONAVIRUS 2 (TAT 6-24 HRS): SARS Coronavirus 2: NEGATIVE

## 2021-02-12 MED ORDER — ONDANSETRON HCL 4 MG PO TABS: 4.0000 mg | ORAL_TABLET | Freq: Four times a day (QID) | ORAL | Status: DC | PRN

## 2021-02-12 MED ORDER — SODIUM CHLORIDE 0.9% IV SOLUTION: Freq: Once | INTRAVENOUS | Status: AC

## 2021-02-12 MED ORDER — ONDANSETRON HCL 4 MG/2ML IJ SOLN
4.0000 mg | Freq: Four times a day (QID) | INTRAMUSCULAR | Status: DC | PRN
  Administered 2021-02-16: 4 mg via INTRAVENOUS
  Filled 2021-02-12: qty 2

## 2021-02-12 MED ORDER — POTASSIUM CHLORIDE 20 MEQ PO PACK
40.0000 meq | PACK | Freq: Once | ORAL | Status: AC
  Administered 2021-02-12: 40 meq via ORAL
  Filled 2021-02-12: qty 2

## 2021-02-12 MED ORDER — ACETAMINOPHEN 325 MG PO TABS
650.0000 mg | ORAL_TABLET | Freq: Four times a day (QID) | ORAL | Status: DC | PRN
  Administered 2021-02-18: 650 mg via ORAL
  Filled 2021-02-12: qty 2

## 2021-02-12 MED ORDER — VITAMIN B-12 1000 MCG PO TABS
1000.0000 ug | ORAL_TABLET | Freq: Every day | ORAL | Status: DC
  Administered 2021-02-12 – 2021-02-22 (×9): 1000 ug via ORAL
  Filled 2021-02-12 (×9): qty 1

## 2021-02-12 MED ORDER — SODIUM CHLORIDE 0.9% FLUSH
3.0000 mL | Freq: Two times a day (BID) | INTRAVENOUS | Status: DC
  Administered 2021-02-12 – 2021-02-21 (×17): 3 mL via INTRAVENOUS

## 2021-02-12 MED ORDER — ACETAMINOPHEN 650 MG RE SUPP: 650.0000 mg | Freq: Four times a day (QID) | RECTAL | Status: DC | PRN

## 2021-02-12 MED ORDER — SODIUM CHLORIDE 0.9 % IV SOLN: INTRAVENOUS | Status: DC

## 2021-02-12 MED ORDER — PANTOPRAZOLE SODIUM 40 MG IV SOLR
40.0000 mg | Freq: Two times a day (BID) | INTRAVENOUS | Status: DC
  Administered 2021-02-12 – 2021-02-21 (×18): 40 mg via INTRAVENOUS
  Filled 2021-02-12 (×18): qty 40

## 2021-02-12 NOTE — H&P (View-Only) (Signed)
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  The patient was attending a softball game yesterday and she felt tachycardic and lightheaded.  She suffered a syncopal episode and she was confused when she woke up.  Upon admission her HGB was at 7.6 g/dL, but it declined further to 5.8 g/dL.  She was admitted recently from 02/07/2021 - 02/09/2021.   Her admission HGB at that time was 8.6 g/dL and her discharge HGB was 7.5 g/dL, but it was stable.  Since her admission [facility] at [facility] she received 1 units of PRBC.  Her enteroscopy on 02/07/2021 was positive for three mildly bleeding proximal to mid jejunal AVMs.  All the AVMs were ablated with APC, however, one continued to bleed.  Injection with epinephrine was used, but it was the hemoclip that ultimately arrested the bleeding.    Past Medical History:  Diagnosis Date  . Acid reflux   . Hematuria   . Hyperlipemia   . IDA (iron deficiency anemia)   . Melena   . Perimenopausal symptoms   . Vaginal discharge     Past Surgical History:  Procedure Laterality Date  . COLONOSCOPY WITH ESOPHAGOGASTRODUODENOSCOPY (EGD)    . ENTEROSCOPY N/A 07/09/2020   Procedure: ENTEROSCOPY;  Surgeon: Lin Landsman, MD;  Location: [facility] ENDOSCOPY;  Service: Gastroenterology;  Laterality: N/A;  . ENTEROSCOPY N/A 02/07/2021   Procedure: ENTEROSCOPY;  Surgeon: Lin Landsman, MD;  Location: [facility] ENDOSCOPY;  Service: Gastroenterology;  Laterality: N/A;  . ESOPHAGOGASTRODUODENOSCOPY (EGD) WITH PROPOFOL N/A 07/06/2020   Procedure: ESOPHAGOGASTRODUODENOSCOPY (EGD) WITH PROPOFOL;  Surgeon: Lucilla Lame, MD;  Location: [facility] ENDOSCOPY;  Service: Endoscopy;  Laterality: N/A;  . ESOPHAGOGASTRODUODENOSCOPY (EGD) WITH  PROPOFOL N/A 11/19/2020   Procedure: ESOPHAGOGASTRODUODENOSCOPY (EGD) WITH PROPOFOL;  Surgeon: Toledo, Benay Pike, MD;  Location: [facility] ENDOSCOPY;  Service: Gastroenterology;  Laterality: N/A;  . GIVENS CAPSULE STUDY N/A 07/08/2020   Procedure: GIVENS CAPSULE STUDY;  Surgeon: Virgel Manifold, MD;  Location: [facility] ENDOSCOPY;  Service: Endoscopy;  Laterality: N/A;  Administer between 6-8 pm today  . NO PAST SURGERIES      Family History  Problem Relation Age of Onset  . Diabetes Father   . Stroke Father   . Breast cancer Maternal Grandmother   . Breast cancer Paternal Aunt   . High Cholesterol Mother   . Colon cancer Neg Hx   . Ovarian cancer Neg Hx   . Heart disease Neg Hx     Social History:  reports that she has never smoked. She has never used smokeless tobacco. She reports that she does not drink alcohol and does not use drugs.  Allergies:  Allergies  Allergen Reactions  . Feraheme [Ferumoxytol] Anaphylaxis  . Sulfa Antibiotics Other (See Comments)    Childhood allergy, unknown reaction    Medications:  Scheduled: . pantoprazole (PROTONIX) IV  40 mg Intravenous Q12H  . sodium chloride flush  3 mL Intravenous Q12H   Continuous:   Results for orders placed or performed during the hospital encounter of 02/11/21 (from the past 24 hour(s))  Type and screen [place]     Status: None (Preliminary result)   Collection Time: 02/11/21  7:31 PM  Result Value Ref Range   ABO/RH(D) O POS  Antibody Screen NEG    Sample Expiration 02/14/2021,2359    Unit Number [id number]    Blood Component Type RED CELLS,LR    Unit division 00    Status of Unit ISSUED    Transfusion Status OK TO TRANSFUSE    Crossmatch Result Compatible    Unit Number [id number]    Blood Component Type RED CELLS,LR    Unit division 00    Status of Unit ISSUED    Transfusion Status OK TO TRANSFUSE    Crossmatch Result      Compatible Performed at [facility], [address], [place], [place] [address]   CBG monitoring, ED     Status: Abnormal   Collection Time: 02/11/21  7:47 PM  Result Value Ref Range   Glucose-Capillary 100 (H) 70 - 99 mg/dL  CBC with Differential     Status: Abnormal   Collection Time: 02/11/21  7:58 PM  Result Value Ref Range   WBC 12.1 (H) 4.0 - 10.5 K/uL   RBC 2.49 (L) 3.87 - 5.11 MIL/uL   Hemoglobin 7.6 (L) 12.0 - 15.0 g/dL   HCT 24.3 (L) 36.0 - 46.0 %   MCV 97.6 80.0 - 100.0 fL   MCH 30.5 26.0 - 34.0 pg   MCHC 31.3 30.0 - 36.0 g/dL   RDW 20.5 (H) 11.5 - 15.5 %   Platelets 249 150 - 400 K/uL   nRBC 0.9 (H) 0.0 - 0.2 %   Neutrophils Relative % 72 %   Neutro Abs 8.7 (H) 1.7 - 7.7 K/uL   Lymphocytes Relative 21 %   Lymphs Abs 2.5 0.7 - 4.0 K/uL   Monocytes Relative 5 %   Monocytes Absolute 0.6 0.1 - 1.0 K/uL   Eosinophils Relative 1 %   Eosinophils Absolute 0.1 0.0 - 0.5 K/uL   Basophils Relative 0 %   Basophils Absolute 0.0 0.0 - 0.1 K/uL   Immature Granulocytes 1 %   Abs Immature Granulocytes 0.11 (H) 0.00 - 0.07 K/uL  Comprehensive metabolic panel     Status: Abnormal   Collection Time: 02/11/21  7:58 PM  Result Value Ref Range   Sodium 137 135 - 145 mmol/L   Potassium 3.3 (L) 3.5 - 5.1 mmol/L   Chloride 106 98 - 111 mmol/L   CO2 24 22 - 32 mmol/L   Glucose, Bld 100 (H) 70 - 99 mg/dL   BUN 9 6 - 20 mg/dL   Creatinine, Ser 0.57 0.44 - 1.00 mg/dL   Calcium 8.6 (L) 8.9 - 10.3 mg/dL   Total Protein 5.8 (L) 6.5 - 8.1 g/dL   Albumin 3.4 (L) 3.5 - 5.0 g/dL   AST 39 15 - 41 U/L   ALT 61 (H) 0 - 44 U/L   Alkaline Phosphatase 68 38 - 126 U/L   Total Bilirubin 0.6 0.3 - 1.2 mg/dL   GFR, Estimated >60 >60 mL/min   Anion gap 7 5 - 15  I-Stat beta hCG blood, ED     Status: Abnormal   Collection Time: 02/11/21  8:21 PM  Result Value Ref Range   I-stat hCG, quantitative 6.5 (H) <5 mIU/mL   Comment 3          SARS CORONAVIRUS 2 (TAT 6-24 HRS) Nasopharyngeal Nasopharyngeal Swab     Status: None   Collection Time:  02/11/21 10:15 PM   Specimen: Nasopharyngeal Swab  Result Value Ref Range   SARS Coronavirus 2 NEGATIVE NEGATIVE  CBC  Status: Abnormal   Collection Time: 02/12/21  3:07 AM  Result Value Ref Range   WBC 9.2 4.0 - 10.5 K/uL   RBC 1.87 (L) 3.87 - 5.11 MIL/uL   Hemoglobin 5.8 (LL) 12.0 - 15.0 g/dL   HCT 18.4 (L) 36.0 - 46.0 %   MCV 98.4 80.0 - 100.0 fL   MCH 31.0 26.0 - 34.0 pg   MCHC 31.5 30.0 - 36.0 g/dL   RDW 21.2 (H) 11.5 - 15.5 %   Platelets 192 150 - 400 K/uL   nRBC 0.5 (H) 0.0 - 0.2 %  Basic metabolic panel     Status: Abnormal   Collection Time: 02/12/21  3:07 AM  Result Value Ref Range   Sodium 139 135 - 145 mmol/L   Potassium 4.0 3.5 - 5.1 mmol/L   Chloride 112 (H) 98 - 111 mmol/L   CO2 24 22 - 32 mmol/L   Glucose, Bld 95 70 - 99 mg/dL   BUN 10 6 - 20 mg/dL   Creatinine, Ser 0.51 0.44 - 1.00 mg/dL   Calcium 8.5 (L) 8.9 - 10.3 mg/dL   GFR, Estimated >60 >60 mL/min   Anion gap 3 (L) 5 - 15  Hemoglobin and hematocrit, blood     Status: Abnormal   Collection Time: 02/12/21  5:01 AM  Result Value Ref Range   Hemoglobin 6.1 (LL) 12.0 - 15.0 g/dL   HCT 19.8 (L) 36.0 - 46.0 %  Prepare RBC (crossmatch)     Status: None   Collection Time: 02/12/21  5:53 AM  Result Value Ref Range   Order Confirmation      ORDER PROCESSED BY BLOOD BANK Performed at [facility], [address] [place], [place] [address]   Prepare RBC (crossmatch)     Status: None   Collection Time: 02/12/21  9:01 AM  Result Value Ref Range   Order Confirmation      ORDER PROCESSED BY BLOOD BANK Performed at [facility], [place] [address] [place], [place] [address]      No results found.  ROS:  As stated above in the HPI otherwise negative.  Blood pressure (!) 87/63, pulse 73, temperature 98.1 F (36.7 C), temperature source Oral, resp. rate 15, height 5\' 2"  (1.575 m), weight 57.2 kg, last menstrual period 01/24/2021, SpO2 99 %.    PE: Gen: NAD, Alert and Oriented HEENT:  /AT,  EOMI Neck: Supple, no LAD Lungs: CTA Bilaterally CV: RRR without M/G/R ABD: Soft, NTND, +BS Ext: No C/C/E  Assessment/Plan: 1) Bleeding AVM(s). 2) Anemia. 3) Syncope secondary to anemia.   The patient is currently stable.  Her HGB increased to the 9 g/dL range.  She will undergo a repeat enteroscopy.  There is the suspicion that the AVM treated by Dr. Marius Ditch is bleeding again.  It should be easier to identify given the recent treatment with APC.  Plan: 1) Enteroscopy tomorrow. 2) Follow HGB and transfuse as necessary.  Jaelene Garciagarcia D 02/12/2021, 12:46 PM

## 2021-02-12 NOTE — Consult Note (Signed)
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  The patient was attending a softball game yesterday and she felt tachycardic and lightheaded.  She suffered a syncopal episode and she was confused when she woke up.  Upon admission her HGB was at 7.6 g/dL, but it declined further to 5.8 g/dL.  She was admitted recently from 02/07/2021 - 02/09/2021.   Her admission HGB at that time was 8.6 g/dL and her discharge HGB was 7.5 g/dL, but it was stable.  Since her admission here at [facility] she received 1 units of PRBC.  Her enteroscopy on 02/07/2021 was positive for three mildly bleeding proximal to mid jejunal AVMs.  All the AVMs were ablated with APC, however, one continued to bleed.  Injection with epinephrine was used, but it was the hemoclip that ultimately arrested the bleeding.    Past Medical History:  Diagnosis Date  . Acid reflux   . Hematuria   . Hyperlipemia   . IDA (iron deficiency anemia)   . Melena   . Perimenopausal symptoms   . Vaginal discharge     Past Surgical History:  Procedure Laterality Date  . COLONOSCOPY WITH ESOPHAGOGASTRODUODENOSCOPY (EGD)    . ENTEROSCOPY N/A 07/09/2020   Procedure: ENTEROSCOPY;  Surgeon: Vanga, Rohini Reddy, MD;  Location: [facility]COPY;  Service: Gastroenterology;  Laterality: N/A;  . ENTEROSCOPY N/A 02/07/2021   Procedure: ENTEROSCOPY;  Surgeon: Vanga, Rohini Reddy, MD;  Location: [facility]COPY;  Service: Gastroenterology;  Laterality: N/A;  . ESOPHAGOGASTRODUODENOSCOPY (EGD) WITH PROPOFOL N/A 07/06/2020   Procedure: ESOPHAGOGASTRODUODENOSCOPY (EGD) WITH PROPOFOL;  Surgeon: Wohl, Darren, MD;  Location: [facility]OSCOPY;  Service: Endoscopy;  Laterality: N/A;  . ESOPHAGOGASTRODUODENOSCOPY (EGD) WITH  PROPOFOL N/A 11/19/2020   Procedure: ESOPHAGOGASTRODUODENOSCOPY (EGD) WITH PROPOFOL;  Surgeon: Toledo, Teodoro K, MD;  Location: [facility]OSCOPY;  Service: Gastroenterology;  Laterality: N/A;  . GIVENS CAPSULE STUDY N/A 07/08/2020   Procedure: GIVENS CAPSULE STUDY;  Surgeon: Tahiliani, Varnita B, MD;  Location: [facility] ENDOSCOPY;  Service: Endoscopy;  Laterality: N/A;  Administer between 6-8 pm today  . NO PAST SURGERIES      Family History  Problem Relation Age of Onset  . Diabetes Father   . Stroke Father   . Breast cancer Maternal Grandmother   . Breast cancer Paternal Aunt   . High Cholesterol Mother   . Colon cancer Neg Hx   . Ovarian cancer Neg Hx   . Heart disease Neg Hx     Social History:  reports that she has never smoked. She has never used smokeless tobacco. She reports that she does not drink alcohol and does not use drugs.  Allergies:  Allergies  Allergen Reactions  . Feraheme [Ferumoxytol] Anaphylaxis  . Sulfa Antibiotics Other (See Comments)    Childhood allergy, unknown reaction    Medications:  Scheduled: . pantoprazole (PROTONIX) IV  40 mg Intravenous Q12H  . sodium chloride flush  3 mL Intravenous Q12H   Continuous:   Results for orders placed or performed during the hospital encounter of 02/11/21 (from the past 24 hour(s))  Type and screen [facility]     Status: None (Preliminary result)   Collection Time: 02/11/21  7:31 PM  Result Value Ref Range   ABO/RH(D) O POS      Antibody Screen NEG    Sample Expiration 02/14/2021,2359    Unit Number [id number]    Blood Component Type RED CELLS,LR    Unit division 00    Status of Unit ISSUED    Transfusion Status OK TO TRANSFUSE    Crossmatch Result Compatible    Unit Number [id number]    Blood Component Type RED CELLS,LR    Unit division 00    Status of Unit ISSUED    Transfusion Status OK TO TRANSFUSE    Crossmatch Result      Compatible Performed at [facility], [address], [place], [place] [address]   CBG monitoring, ED     Status: Abnormal   Collection Time: 02/11/21  7:47 PM  Result Value Ref Range   Glucose-Capillary 100 (H) 70 - 99 mg/dL  CBC with Differential     Status: Abnormal   Collection Time: 02/11/21  7:58 PM  Result Value Ref Range   WBC 12.1 (H) 4.0 - 10.5 K/uL   RBC 2.49 (L) 3.87 - 5.11 MIL/uL   Hemoglobin 7.6 (L) 12.0 - 15.0 g/dL   HCT 24.3 (L) 36.0 - 46.0 %   MCV 97.6 80.0 - 100.0 fL   MCH 30.5 26.0 - 34.0 pg   MCHC 31.3 30.0 - 36.0 g/dL   RDW 20.5 (H) 11.5 - 15.5 %   Platelets 249 150 - 400 K/uL   nRBC 0.9 (H) 0.0 - 0.2 %   Neutrophils Relative % 72 %   Neutro Abs 8.7 (H) 1.7 - 7.7 K/uL   Lymphocytes Relative 21 %   Lymphs Abs 2.5 0.7 - 4.0 K/uL   Monocytes Relative 5 %   Monocytes Absolute 0.6 0.1 - 1.0 K/uL   Eosinophils Relative 1 %   Eosinophils Absolute 0.1 0.0 - 0.5 K/uL   Basophils Relative 0 %   Basophils Absolute 0.0 0.0 - 0.1 K/uL   Immature Granulocytes 1 %   Abs Immature Granulocytes 0.11 (H) 0.00 - 0.07 K/uL  Comprehensive metabolic panel     Status: Abnormal   Collection Time: 02/11/21  7:58 PM  Result Value Ref Range   Sodium 137 135 - 145 mmol/L   Potassium 3.3 (L) 3.5 - 5.1 mmol/L   Chloride 106 98 - 111 mmol/L   CO2 24 22 - 32 mmol/L   Glucose, Bld 100 (H) 70 - 99 mg/dL   BUN 9 6 - 20 mg/dL   Creatinine, Ser 0.57 0.44 - 1.00 mg/dL   Calcium 8.6 (L) 8.9 - 10.3 mg/dL   Total Protein 5.8 (L) 6.5 - 8.1 g/dL   Albumin 3.4 (L) 3.5 - 5.0 g/dL   AST 39 15 - 41 U/L   ALT 61 (H) 0 - 44 U/L   Alkaline Phosphatase 68 38 - 126 U/L   Total Bilirubin 0.6 0.3 - 1.2 mg/dL   GFR, Estimated >60 >60 mL/min   Anion gap 7 5 - 15  I-Stat beta hCG blood, ED     Status: Abnormal   Collection Time: 02/11/21  8:21 PM  Result Value Ref Range   I-stat hCG, quantitative 6.5 (H) <5 mIU/mL   Comment 3          SARS CORONAVIRUS 2 (TAT 6-24 HRS) Nasopharyngeal Nasopharyngeal Swab     Status: None   Collection Time:  02/11/21 10:15 PM   Specimen: Nasopharyngeal Swab  Result Value Ref Range   SARS Coronavirus 2 NEGATIVE NEGATIVE  CBC  Status: Abnormal   Collection Time: 02/12/21  3:07 AM  Result Value Ref Range   WBC 9.2 4.0 - 10.5 K/uL   RBC 1.87 (L) 3.87 - 5.11 MIL/uL   Hemoglobin 5.8 (LL) 12.0 - 15.0 g/dL   HCT 18.4 (L) 36.0 - 46.0 %   MCV 98.4 80.0 - 100.0 fL   MCH 31.0 26.0 - 34.0 pg   MCHC 31.5 30.0 - 36.0 g/dL   RDW 21.2 (H) 11.5 - 15.5 %   Platelets 192 150 - 400 K/uL   nRBC 0.5 (H) 0.0 - 0.2 %  Basic metabolic panel     Status: Abnormal   Collection Time: 02/12/21  3:07 AM  Result Value Ref Range   Sodium 139 135 - 145 mmol/L   Potassium 4.0 3.5 - 5.1 mmol/L   Chloride 112 (H) 98 - 111 mmol/L   CO2 24 22 - 32 mmol/L   Glucose, Bld 95 70 - 99 mg/dL   BUN 10 6 - 20 mg/dL   Creatinine, Ser 0.51 0.44 - 1.00 mg/dL   Calcium 8.5 (L) 8.9 - 10.3 mg/dL   GFR, Estimated >60 >60 mL/min   Anion gap 3 (L) 5 - 15  Hemoglobin and hematocrit, blood     Status: Abnormal   Collection Time: 02/12/21  5:01 AM  Result Value Ref Range   Hemoglobin 6.1 (LL) 12.0 - 15.0 g/dL   HCT 19.8 (L) 36.0 - 46.0 %  Prepare RBC (crossmatch)     Status: None   Collection Time: 02/12/21  5:53 AM  Result Value Ref Range   Order Confirmation      ORDER PROCESSED BY BLOOD BANK Performed at [facility], [address], [place], [place] [address]   Prepare RBC (crossmatch)     Status: None   Collection Time: 02/12/21  9:01 AM  Result Value Ref Range   Order Confirmation      ORDER PROCESSED BY BLOOD BANK Performed at [facility], [place] [address] [place], [place] [address]      No results found.  ROS:  As stated above in the HPI otherwise negative.  Blood pressure (!) 87/63, pulse 73, temperature 98.1 F (36.7 C), temperature source Oral, resp. rate 15, height 5\' 2"  (1.575 m), weight 57.2 kg, last menstrual period 01/24/2021, SpO2 99 %.    PE: Gen: NAD, Alert and Oriented HEENT:  [place]/AT,  EOMI Neck: Supple, no LAD Lungs: CTA Bilaterally CV: RRR without M/G/R ABD: Soft, NTND, +BS Ext: No C/C/E  Assessment/Plan: 1) Bleeding AVM(s). 2) Anemia. 3) Syncope secondary to anemia.   The patient is currently stable.  Her HGB increased to the 9 g/dL range.  She will undergo a repeat enteroscopy.  There is the suspicion that the AVM treated by Dr. Marius Ditch is bleeding again.  It should be easier to identify given the recent treatment with APC.  Plan: 1) Enteroscopy tomorrow. 2) Follow HGB and transfuse as necessary.  Sherelle Castelli D 02/12/2021, 12:46 PM

## 2021-02-12 NOTE — Progress Notes (Addendum)
PROGRESS NOTE    Paige Robertson  ONG:[id number] DOB: 23-Sep-1976 DOA: 02/11/2021 PCP: Enid Baas, MD    Chief Complaint  Patient presents with  . Syncope     Brief Narrative:  Paige Robertson is Paige Robertson 45 y.o. 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  Patient was recently admitted at [facility] from 02/07/2021-02/09/2021 for acute blood loss anemia due to duodenal AVM GI bleed.  Patient underwent small bowel enteroscopy on 02/07/2021 and underwent argon laser treatment and clipping by Dr. Allegra Lai.  She was also transfused 1 unit PRBC and IV iron sucrose during admission.  Hemoglobin was stable at 7.5 on day of discharge.  She was started on Protonix 40 mg twice daily which she says she took her first dose this morning.  Patient states she went to Paige Robertson softball game earlier on day of admission (02/11/2021).  She was sitting down for about 15-20 minutes when she felt her heart racing, became lightheaded, and diaphoretic.  She says she blacked out and when she awoke her family were calling 911.  Family noted that her eyes were dilated, she appeared very confused and lethargic with slurred speech and pallor.  This episode lasted several minutes.  She has not seen any obvious bleeding.  She says this episode felt similar to the symptoms she experienced on her recent admission.  She has been avoiding blood thinners and NSAIDs.  She's been admitted for anemia.  S/p 2 units pRBC.  GI planning enteroscopy tomorrow.  Assessment & Plan:   Principal Problem:   Near syncope Active Problems:   GI bleeding   Acute blood loss anemia  Paige Robertson is Paige Robertson 45 y.o. 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  Near-syncope/syncopal episode acute blood loss anemia due to duodenal AVM bleeding: - Hb nadir 5.8 - s/p 2 units pRBC - Place on IV Protonix 40 mg twice daily - trend H/H - GI c/s, appreciate recs - plan for enteroscopy tomorrow  Vitamin B12 low normal Supplement, follow outpatient  Low Copper  At previous hospital stay, follow zinc level  Elevated istat hcg Follow serum hcg  DVT prophylaxis: SCD Code Status: full  Family Communication: husband Disposition:   Status is: Inpatient  Remains inpatient appropriate because:Inpatient level of care appropriate due to severity of illness   Dispo: The patient is from: Home              Anticipated d/c is to: Home              Patient currently is not medically stable to d/c.   Difficult to place patient No       Consultants:   GI  Procedures:  none  Antimicrobials:  Anti-infectives (From admission, onward)   None         Subjective: No new complaints Wondering when study will be/when GI will see her  Objective: Vitals:   02/12/21 1221 02/12/21 1300 02/12/21 1750 02/12/21 1952  BP: (!) 87/63 (!) 89/54 (!) 100/56 (!) 90/56  Pulse: 73 66  73  Resp: 15 16  16   Temp: 98.1 F (36.7 C) 97.8 F (36.6 C)  98.6 F (37 C)  TempSrc: Oral Oral  Oral  SpO2: 99% 99%  100%  Weight:      Height:        Intake/Output Summary (Last 24 hours) at 02/12/2021 2030 Last data filed at 02/12/2021 1800 Gross per 24 hour  Intake 2648.11 ml  Output --  Net 2648.11 ml   Filed Weights   02/11/21 1925  Weight: 57.2 kg    Examination:  General exam: Appears calm and comfortable  Respiratory system: Clear to auscultation. Respiratory effort normal. Cardiovascular system: S1 & S2 heard, RRR. Marland Kitchen Gastrointestinal system: Abdomen is nondistended, soft and nontender.  Central nervous system: Alert and oriented. No focal neurological deficits. Extremities: no LEE Skin: No rashes, lesions or ulcers Psychiatry:  Judgement and insight appear normal. Mood & affect appropriate.     Data Reviewed: I have personally reviewed following labs and imaging studies  CBC: Recent Labs  Lab 02/08/21 0352 02/08/21 0922 02/08/21 1837 02/09/21 0453 02/09/21 0858 02/09/21 1554 02/11/21 1958 02/12/21 0307 02/12/21 0501 02/12/21 1318  WBC 14.7* 14.8*  --  11.7*  --   --  12.1* 9.2  --   --   NEUTROABS  --   --   --   --   --   --  8.7*  --   --   --   HGB 7.3* 7.7*   < > 7.2*   < > 7.5* 7.6* 5.8* 6.1* 9.4*  HCT 22.5* 23.4*   < > 22.5*  --   --  24.3* 18.4* 19.8* 29.1*  MCV 90.0 89.7  --  91.1  --   --  97.6 98.4  --   --   PLT 155 163  --  141*  --   --  249 192  --   --    < > = values in this interval not displayed.    Basic Metabolic Panel: Recent Labs  Lab 02/07/21 0847 02/08/21 0352 02/09/21 0453 02/11/21 1958 02/12/21 0307  NA 136 140 137 137 139  K 3.7 3.9 3.6 3.3* 4.0  CL 105 113* 109 106 112*  CO2 23 22 23 24 24   GLUCOSE 161* 125* 91 100* 95  BUN 24* 9 20 9 10   CREATININE 0.58 0.52 0.60 0.57 0.51  CALCIUM 9.2 8.7* 8.5* 8.6* 8.5*  MG  --   --  2.1  --   --     GFR: Estimated Creatinine Clearance: 71 mL/min (by C-G formula based on SCr of 0.51 mg/dL).  Liver Function Tests: Recent Labs  Lab 02/07/21 0847 02/11/21 1958  AST 35 39  ALT 51* 61*  ALKPHOS 56 68  BILITOT 0.4 0.6  PROT 7.0 5.8*  ALBUMIN 4.3 3.4*    CBG: Recent Labs  Lab 02/09/21 0752 02/09/21 1146 02/11/21 1947 02/12/21 1930 02/12/21 2004  GLUCAP 86 84 100* 60* 114*     Recent Results (from the past 240 hour(s))  Resp Panel by RT-PCR (Flu Tosha Belgarde&B, Covid) Nasopharyngeal Swab     Status: None   Collection Time: 02/07/21  8:47 AM   Specimen: Nasopharyngeal Swab; Nasopharyngeal(NP) swabs in vial transport medium  Result Value Ref Range Status   SARS Coronavirus 2 by RT PCR NEGATIVE NEGATIVE Final    Comment: (NOTE) SARS-CoV-2 target nucleic acids are NOT DETECTED.  The SARS-CoV-2 RNA is generally  detectable in upper respiratory specimens during the acute phase of infection. The lowest concentration of SARS-CoV-2 viral copies this assay can detect is 138 copies/mL. Renella Steig negative result does not preclude SARS-Cov-2 infection and should not be used as  the sole basis for treatment or other patient management decisions. Ibraheem Voris negative result may occur with  improper specimen collection/handling, submission of specimen other than nasopharyngeal swab, presence of viral mutation(s) within the areas targeted by this assay, and inadequate number of viral copies(<138 copies/mL). Eladio Dentremont negative result must be combined with clinical observations, patient history, and epidemiological information. The expected result is Negative.  Fact Sheet for Patients:  BloggerCourse.com  Fact Sheet for Healthcare Providers:  SeriousBroker.it  This test is no t yet approved or cleared by the [place] FDA and  has been authorized for detection and/or diagnosis of SARS-CoV-2 by FDA under an Emergency Use Authorization (EUA). This EUA will remain  in effect (meaning this test can be used) for the duration of the COVID-19 declaration under Section 564(b)(1) of the Act, 21 U.S.C.section 360bbb-3(b)(1), unless the authorization is terminated  or revoked sooner.       Influenza Jamyron Redd by PCR NEGATIVE NEGATIVE Final   Influenza B by PCR NEGATIVE NEGATIVE Final    Comment: (NOTE) The Xpert Xpress SARS-CoV-2/FLU/RSV plus assay is intended as an aid in the diagnosis of influenza from Nasopharyngeal swab specimens and should not be used as Arnisha Laffoon sole basis for treatment. Nasal washings and aspirates are unacceptable for Xpert Xpress SARS-CoV-2/FLU/RSV testing.  Fact Sheet for Patients: BloggerCourse.com  Fact Sheet for Healthcare Providers: SeriousBroker.it  This test is not yet approved or cleared by the [place] FDA  and has been authorized for detection and/or diagnosis of SARS-CoV-2 by FDA under an Emergency Use Authorization (EUA). This EUA will remain in effect (meaning this test can be used) for the duration of the COVID-19 declaration under Section 564(b)(1) of the Act, 21 U.S.C. section 360bbb-3(b)(1), unless the authorization is terminated or revoked.  Performed at [facility], [address] [place], [place] [address]   SARS CORONAVIRUS 2 (TAT 6-24 HRS) Nasopharyngeal Nasopharyngeal Swab     Status: None   Collection Time: 02/11/21 10:15 PM   Specimen: Nasopharyngeal Swab  Result Value Ref Range Status   SARS Coronavirus 2 NEGATIVE NEGATIVE Final    Comment: (NOTE) SARS-CoV-2 target nucleic acids are NOT DETECTED.  The SARS-CoV-2 RNA is generally detectable in upper and lower respiratory specimens during the acute phase of infection. Negative results do not preclude SARS-CoV-2 infection, do not rule out co-infections with other pathogens, and should not be used as the sole basis for treatment or other patient management decisions. Negative results must be combined with clinical observations, patient history, and epidemiological information. The expected result is Negative.  Fact Sheet for Patients: HairSlick.no  Fact Sheet for Healthcare Providers: quierodirigir.com  This test is not yet approved or cleared by the [place] FDA and  has been authorized for detection and/or diagnosis of SARS-CoV-2 by FDA under an Emergency Use Authorization (EUA). This EUA will remain  in effect (meaning this test can be used) for the duration of the COVID-19 declaration under Se ction 564(b)(1) of the Act, 21 U.S.C. section 360bbb-3(b)(1), unless the authorization is terminated or revoked sooner.  Performed at [facility], [address] [place], [place] [address]          Radiology Studies: No results  found.      Scheduled Meds: . pantoprazole (PROTONIX) IV  40 mg Intravenous Q12H  . sodium chloride flush  3 mL Intravenous Q12H   Continuous Infusions: . sodium chloride       LOS: 0 days    Time spent: over  30 min    Lacretia Nicks, MD [facility]   To contact the attending provider between 7A-7P or the covering provider during after hours 7P-7A, please log into the web site [email or web address] and access using universal [place] password for that web site. If you do not have the password, please call the hospital operator.  02/12/2021, 8:30 PM

## 2021-02-12 NOTE — Progress Notes (Addendum)
HOSPITAL MEDICINE OVERNIGHT EVENT NOTE    Notified by nursing that hemoglobin this morning is 5.8, a substantial drop from 7.6 on admission several hours ago.  According to nursing patient denies any symptoms.  In particular, patient has exhibited no evidence of bloody stool/melena or hematemesis.  Patient denies shortness of breath, patient denies chest pain.  Patient is not hypoxic.  Patient is only received 1 L of normal saline.  This is a significant drop in hemoglobin and without any evidence of bleeding I feel it is important to confirm the results with a stat hemoglobin and hematocrit prior to proceeding with transfusion.  Paige Emerald  MD [facility]   ADDENDUM (3/15 5:50AM)  Repeat hemoglobin found to be 6.1.  Will order 1 unit packed red blood cell transfusion now with posttransfusion H&H.  Paige Robertson Paige Robertson

## 2021-02-13 ENCOUNTER — Encounter (HOSPITAL_COMMUNITY)
Admission: EM | Disposition: A | Discharge status: Home / Self Care | Payer: Self-pay | Source: Home / Self Care | Attending: Family Medicine

## 2021-02-13 ENCOUNTER — Inpatient Hospital Stay (HOSPITAL_COMMUNITY): Payer: BC Managed Care – PPO | Admitting: Certified Registered Nurse Anesthetist

## 2021-02-13 ENCOUNTER — Inpatient Hospital Stay (HOSPITAL_COMMUNITY): Payer: BC Managed Care – PPO

## 2021-02-13 ENCOUNTER — Encounter (HOSPITAL_COMMUNITY): Payer: Self-pay | Admitting: Family Medicine

## 2021-02-13 HISTORY — PX: ENTEROSCOPY: SHX5533

## 2021-02-13 HISTORY — PX: SCLEROTHERAPY: SHX6841

## 2021-02-13 HISTORY — PX: HEMOSTASIS CLIP PLACEMENT: SHX6857

## 2021-02-13 LAB — COMPREHENSIVE METABOLIC PANEL
ALT: 35 U/L (ref 0–44)
AST: 17 U/L (ref 15–41)
Albumin: 3 g/dL — ABNORMAL LOW (ref 3.5–5.0)
Alkaline Phosphatase: 57 U/L (ref 38–126)
Anion gap: 5 (ref 5–15)
BUN: 8 mg/dL (ref 6–20)
CO2: 25 mmol/L (ref 22–32)
Calcium: 8.9 mg/dL (ref 8.9–10.3)
Chloride: 107 mmol/L (ref 98–111)
Creatinine, Ser: 0.57 mg/dL (ref 0.44–1.00)
GFR, Estimated: >60 mL/min (ref >60)
Glucose, Bld: 97 mg/dL (ref 70–99)
Potassium: 3.7 mmol/L (ref 3.5–5.1)
Sodium: 137 mmol/L (ref 135–145)
Total Bilirubin: 0.7 mg/dL (ref 0.3–1.2)
Total Protein: 5.2 g/dL — ABNORMAL LOW (ref 6.5–8.1)

## 2021-02-13 LAB — CBC WITH DIFFERENTIAL/PLATELET
Abs Immature Granulocytes: 0.04 10*3/uL (ref 0.00–0.07)
Basophils Absolute: 0 10*3/uL (ref 0.0–0.1)
Basophils Relative: 1 %
Eosinophils Absolute: 0.2 10*3/uL (ref 0.0–0.5)
Eosinophils Relative: 3 %
HCT: 28.2 % — ABNORMAL LOW (ref 36.0–46.0)
Hemoglobin: 9.5 g/dL — ABNORMAL LOW (ref 12.0–15.0)
Immature Granulocytes: 1 %
Lymphocytes Relative: 31 %
Lymphs Abs: 1.8 10*3/uL (ref 0.7–4.0)
MCH: 30.8 pg (ref 26.0–34.0)
MCHC: 33.7 g/dL (ref 30.0–36.0)
MCV: 91.6 fL (ref 80.0–100.0)
Monocytes Absolute: 0.4 10*3/uL (ref 0.1–1.0)
Monocytes Relative: 6 %
Neutro Abs: 3.4 10*3/uL (ref 1.7–7.7)
Neutrophils Relative %: 58 %
Platelets: 204 10*3/uL (ref 150–400)
RBC: 3.08 MIL/uL — ABNORMAL LOW (ref 3.87–5.11)
RDW: 20.3 % — ABNORMAL HIGH (ref 11.5–15.5)
WBC: 5.8 10*3/uL (ref 4.0–10.5)
nRBC: 0 % (ref 0.0–0.2)

## 2021-02-13 LAB — HCG, SERUM, QUALITATIVE: Preg, Serum: NEGATIVE

## 2021-02-13 LAB — TYPE AND SCREEN
ABO/RH(D): O POS
Antibody Screen: NEGATIVE
Unit division: 0
Unit division: 0

## 2021-02-13 LAB — BPAM RBC
Blood Product Expiration Date: 202204112359
Blood Product Expiration Date: 202204162359
ISSUE DATE / TIME: 202203150636
ISSUE DATE / TIME: 202203151032
Unit Type and Rh: 5100
Unit Type and Rh: 5100

## 2021-02-13 LAB — MAGNESIUM: Magnesium: 2.3 mg/dL (ref 1.7–2.4)

## 2021-02-13 LAB — GLUCOSE, CAPILLARY
Glucose-Capillary: 86 mg/dL (ref 70–99)
Glucose-Capillary: 95 mg/dL (ref 70–99)

## 2021-02-13 LAB — PHOSPHORUS: Phosphorus: 3 mg/dL (ref 2.5–4.6)

## 2021-02-13 SURGERY — ENTEROSCOPY
Anesthesia: Monitor Anesthesia Care

## 2021-02-13 MED ORDER — PHENYLEPHRINE HCL-NACL 10-0.9 MG/250ML-% IV SOLN
INTRAVENOUS | Status: DC | PRN
  Administered 2021-02-13: 40 ug/min via INTRAVENOUS

## 2021-02-13 MED ORDER — IOHEXOL 300 MG/ML  SOLN
100.0000 mL | Freq: Once | INTRAMUSCULAR | Status: AC | PRN
  Administered 2021-02-13: 100 mL via INTRAVENOUS

## 2021-02-13 MED ORDER — FENTANYL CITRATE (PF) 100 MCG/2ML IJ SOLN
25.0000 ug | Freq: Once | INTRAMUSCULAR | Status: AC
  Administered 2021-02-13: 25 ug via INTRAVENOUS

## 2021-02-13 MED ORDER — PHENYLEPHRINE HCL (PRESSORS) 10 MG/ML IV SOLN
INTRAVENOUS | Status: DC | PRN
  Administered 2021-02-13: 120 ug via INTRAVENOUS

## 2021-02-13 MED ORDER — PROPOFOL 10 MG/ML IV BOLUS
INTRAVENOUS | Status: DC | PRN
  Administered 2021-02-13 (×2): 20 mg via INTRAVENOUS

## 2021-02-13 MED ORDER — FENTANYL CITRATE (PF) 100 MCG/2ML IJ SOLN
INTRAMUSCULAR | Status: AC
  Filled 2021-02-13: qty 2

## 2021-02-13 MED ORDER — GLYCOPYRROLATE 0.2 MG/ML IJ SOLN
INTRAMUSCULAR | Status: DC | PRN
  Administered 2021-02-13: .2 mg via INTRAVENOUS

## 2021-02-13 MED ORDER — PROPOFOL 500 MG/50ML IV EMUL
INTRAVENOUS | Status: DC | PRN
  Administered 2021-02-13: 125 ug/kg/min via INTRAVENOUS

## 2021-02-13 MED ORDER — SODIUM CHLORIDE (PF) 0.9 % IJ SOLN
PREFILLED_SYRINGE | INTRAMUSCULAR | Status: DC | PRN
  Administered 2021-02-13: 3 mL

## 2021-02-13 MED ORDER — LIDOCAINE 2% (20 MG/ML) 5 ML SYRINGE
INTRAMUSCULAR | Status: DC | PRN
  Administered 2021-02-13: 80 mg via INTRAVENOUS

## 2021-02-13 MED ORDER — ONDANSETRON HCL 4 MG/2ML IJ SOLN
INTRAMUSCULAR | Status: DC | PRN
  Administered 2021-02-13: 4 mg via INTRAVENOUS

## 2021-02-13 MED ORDER — GLUCAGON HCL RDNA (DIAGNOSTIC) 1 MG IJ SOLR
INTRAMUSCULAR | Status: DC | PRN
  Administered 2021-02-13: .5 mg via INTRAVENOUS

## 2021-02-13 MED ORDER — DEXTROSE 5 % IV SOLN: INTRAVENOUS | Status: DC

## 2021-02-13 NOTE — Hospital Course (Addendum)
16 white female community dwelling Status post argon laser/clipping Dr. Marius Ditch [facility] GI bleeds and duodenum at that time between 310-02/09/2021-Rx 1U PRBC-discharge hemoglobin 7  Prior pathology 07/06/2020 on endoscopy = duodenitis without concerning findings  Represent [facility] ED tachycardia, lightheaded, diaphoretic-syncopized-confused post ictal appearing for several minutes Hemoglobin 5.8 Pregnancy testing negative  Transfused 2 units PRBC Dr. Benson [place] GI evaluated  IMPRESSION: 1. 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 An angulated loop of jejunum extends down to the pelvis to the margin of this mass, and in sharply angles back up into the upper, and accordingly may be tethered to this mass. The hemoclips in the jejunum are immediately along the angulated inferior margin of this loop of jejunum, at the upper edge of the mass. Possibilities may include carcinoid, GI stromal tumor, other jejunal tumors, or a mass arising from the adjacent uterus/right ovary (although the tumor is not highly characteristic of a fibroid or typical ovarian neoplasm). Tissue diagnosis is recommended. 2. Mild reticulonodular opacity in the lingula favoring atypical or early infectious process. 3. Nonspecific 3.0 by 2.7 cm cystic lesion along the left side of the mass and adjacent to the left adnexa, but probably not arising from the ovary. 4. Mild levoconvex lumbar scoliosis. 5. Small sclerotic lesion posteriorly in the L4 vertebral body is probably benign.

## 2021-02-13 NOTE — Interval H&P Note (Signed)
History and Physical Interval Note:  02/13/2021 12:28 PM  Paige Robertson  has presented today for surgery, with the diagnosis of GI bleed.  The various methods of treatment have been discussed with the patient and family. After consideration of risks, benefits and other options for treatment, the patient has consented to  Procedure(s): ENTEROSCOPY (N/A) as a surgical intervention.  The patient's history has been reviewed, patient examined, no change in status, stable for surgery.  I have reviewed the patient's chart and labs.  Questions were answered to the patient's satisfaction.     [name] D

## 2021-02-13 NOTE — Transfer of Care (Signed)
Immediate Anesthesia Transfer of Care Note  Patient: Paige Robertson  Procedure(s) Performed: ENTEROSCOPY (N/A ) SCLEROTHERAPY  Patient Location: Endoscopy Unit  Anesthesia Type:MAC  Level of Consciousness: awake, patient cooperative and responds to stimulation  Airway & Oxygen Therapy: Patient Spontanous Breathing  Post-op Assessment: Report given to RN and Post -op Vital signs reviewed and stable  Post vital signs: Reviewed and stable  Last Vitals:  Vitals Value Taken Time  BP    Temp    Pulse 71 02/13/21 1324  Resp 17 02/13/21 1324  SpO2 99 % 02/13/21 1324  Vitals shown include unvalidated device data.  Last Pain:  Vitals:   02/13/21 1211  TempSrc: Oral  PainSc: 0-No pain         Complications: No complications documented.

## 2021-02-13 NOTE — Op Note (Signed)
[facility] Patient Name: Paige Robertson Procedure Date : 02/13/2021 MRN: [id number] Attending MD: Carol Ada , MD Date of Birth: Oct 01, 1976 CSN: [id number] Age: 45 Admit Type: Inpatient Procedure:                Small bowel enteroscopy Indications:              Arteriovenous malformation in the small intestine Providers:                Carol Ada, MD, Nelia Shi, RN, Elspeth Cho Tech., Technician, Tressia Miners, CRNA Referring MD:              Medicines:                Propofol per Anesthesia Complications:            No immediate complications. Estimated Blood Loss:     Estimated blood loss was minimal. Procedure:                Pre-Anesthesia Assessment:                           - Prior to the procedure, a History and Physical                            was performed, and patient medications and                            allergies were reviewed. The patient's tolerance of                            previous anesthesia was also reviewed. The risks                            and benefits of the procedure and the sedation                            options and risks were discussed with the patient.                            All questions were answered, and informed consent                            was obtained. Prior Anticoagulants: The patient has                            taken no previous anticoagulant or antiplatelet                            agents. ASA Grade Assessment: II - A patient with                            mild systemic disease. After reviewing the risks  and benefits, the patient was deemed in                            satisfactory condition to undergo the procedure.                           - Sedation was administered by an anesthesia                            professional. Deep sedation was attained.                           After obtaining informed consent, the endoscope  was                            passed under direct vision. Throughout the                            procedure, the patient's blood pressure, pulse, and                            oxygen saturations were monitored continuously. The                            [id number]-H190DL ([id number] Olympus pediatric colonscope                            was introduced through the mouth and advanced to                            the small bowel distal to the Ligament of Treitz.                            The small bowel enteroscopy was accomplished                            without difficulty. The patient tolerated the                            procedure well. Scope In: Scope Out: Findings:      The esophagus was normal.      The stomach was normal.      The examined duodenum was normal.      One non-bleeding superficial jejunal ulcer with no stigmata of bleeding       was found in the proximal jejunum. The lesion was 10 mm in largest       dimension. To stop active bleeding, four hemostatic clips were       successfully placed (MR conditional). There was no bleeding at the end       of the procedure. Area was successfully injected with 1 mL of a 1:10,000       solution of epinephrine for hemostasis.      A tattoo was seen in the proximal jejunum.      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  There was a 1 cm superficial ulceration and a smaller 3-4 mm ulceration.       There was no evidence of any active bleeding. At this site there was       evidence of a tattoo. Distal to this area another tattoo site was       identified and this was consistent with the marking for the most distal       area reached in her small bowel with her recent SBE. The pediatric       colonoscope was able to be advanced 10-20 cm further distally, but there       was no evidence of any other abnormalities. It was felt that the       ulceration was the source of the bleeding. In the vein of  treatment for       a postpolypectomy ulcer, hemoclips were deployed to secure the site. In       an attempt to secure the site bleeding was induced and the bleeding was       in greater porportion to what would be expected for closure of a post       polypectomy bleed. The surrounding normal mucosa appeared to be more       friable. With very close inspection one area of persistent and rapid       oozing was localized. The blood was not spurting, but it appear to be       arterial in nature. Pulsatility in the mucosa identified, which was       previously noted with the prior SBE. One ml of 1:10,000 Epi was injected       and 2 ml were sprayed over the area. The bleeding arrested. With the       amount of bleeding and the pulsitile vessel below or adjacent to this       area IR was consulted for possible embolization. A total of four       hemoclips were deployed. Two remained intact along the edges as the       entire defect was not able to be closed. The stiffness of the ulcer bed       prevented closure. One hemoclip fell off and a fourth hemoclip was       placed in normal mucosa adjacent to the ulcer for marking. Impression:               - Normal esophagus.                           - Normal stomach.                           - Normal examined duodenum.                           - Non-bleeding jejunal ulcer with no stigmata of                            bleeding Clips (MR conditional) were placed.                            Injected.                           -  A tattoo was seen in the jejunum.                           - No specimens collected. Recommendation:           - Return patient to hospital ward for ongoing care.                           - NPO.                           - Consult IR for possible embolization at the site                            of the hemoclips. 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 All rights reserved. The codes documented in this report are preliminary and upon coder review may  be revised to meet current compliance requirements. Carol Ada, MD Carol Ada, MD 02/13/2021 1:35:13 PM This report has been signed electronically. Number of Addenda: 0

## 2021-02-13 NOTE — Progress Notes (Signed)
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 Almira Bar GI bleeds and duodenum at that time between 3/10-3/10/2021-Rx 1U PRBC-discharge hemoglobin 7  Prior pathology 07/06/2020 on endoscopy = duodenitis without concerning findings Husband/significant other is volunteer EMS-tells me patient felt lightheaded after discharge 3/12-syncopized  Patient was getting iron infusions under care of Dr. Tasia Catchings oncology until January and then stopped-patient apparently has also had a capsule study in the past and her index endoscopy a year ago by Dr. Alice Reichert revealed 8 areas that were cauterized    Represent [facility] ED tachycardia, lightheaded, diaphoretic-syncopized-confused post ictal appearing for several minutes Hemoglobin 5.8 Pregnancy testing negative  Transfused 2 units PRBC Dr. Benson Norway GI evaluated   Hospital-Problem based course  Recurrent GI bleed?  AVMs Dr. Benson Norway has evaluated and will be performing enteroscopy Continue Protonix IV 40 twice daily-D5 at 75 cc/H No further overt bleed?  AVM has stopped bleeding Defer to GI-if no findings on enteroscopy-may need either outpatient or inpatient transfer to tertiary center-discussed with patient/family Defer to Dr. Benson Norway planning/feasibility of the same BMI 23 Severe iron deficiency anemia previously We will check iron stores prior to discharge May require reinitiation of IV iron at [facility] on discharge   DVT prophylaxis: SCD Code Status: Full Family Communication: Long discussion at bedside Archie Atilano [phone] Disposition:  Status is: Inpatient  Remains inpatient appropriate because:Hemodynamically unstable, Persistent severe electrolyte disturbances and IV treatments appropriate due to intensity of illness or inability to take PO   Dispo: The  patient is from: Home              Anticipated d/c is to: Home              Patient currently is not medically stable to d/c.   Difficult to place patient No       Consultants:   Dr. Benson Norway gastroenterology  Procedures: Enteroscopy scheduled for 3/16  Antimicrobials: None currently   Subjective: Awake coherent no distress Reports formed but black stool last p.m.-none further since Was not dizzy today when orthostatics were performed N.p.o. for procedure Husband and I had a long conversation about her GI history  Objective: Vitals:   02/12/21 1750 02/12/21 1952 02/13/21 0005 02/13/21 0445  BP: (!) 100/56 (!) 90/56 (!) 88/55 (!) 89/60  Pulse:  73 62 63  Resp:  16 18 16   Temp:  98.6 F (37 C) 98.4 F (36.9 C) 98.2 F (36.8 C)  TempSrc:  Oral Oral Oral  SpO2:  100% 99% 98%  Weight:      Height:        Intake/Output Summary (Last 24 hours) at 02/13/2021 1038 Last data filed at 02/13/2021 0500 Gross per 24 hour  Intake 1408.64 ml  Output --  Net 1408.64 ml   Filed Weights   02/11/21 1925  Weight: 57.2 kg    Examination: Coherent pleasant looks younger than stated age mild pallor no icterus Throat clear Mallampati 1 No lymphadenopathy S1-S2 no murmur CTA B no added sound rales rhonchi Abdomen soft no rebound guarding bowel sounds heard No lower extremity edema ROM intact Psych euthymic   Data Reviewed: personally reviewed   Data  Hemoglobin 9.8-->9.5 MCV 91 BUNs/creatinine stable  CBC    Component Value Date/Time   WBC 5.8 02/13/2021 0542   RBC 3.08 (L) 02/13/2021 0542   HGB  9.5 (L) 02/13/2021 0542   HGB 13.1 10/01/2017 1115   HCT 28.2 (L) 02/13/2021 0542   HCT 39.8 10/01/2017 1115   PLT 204 02/13/2021 0542   PLT 307 10/01/2017 1115   MCV 91.6 02/13/2021 0542   MCV 93 10/01/2017 1115   MCH 30.8 02/13/2021 0542   MCHC 33.7 02/13/2021 0542   RDW 20.3 (H) 02/13/2021 0542   RDW 13.7 10/01/2017 1115   LYMPHSABS 1.8 02/13/2021 0542   LYMPHSABS  2.1 10/01/2017 1115   MONOABS 0.4 02/13/2021 0542   EOSABS 0.2 02/13/2021 0542   EOSABS 0.1 10/01/2017 1115   BASOSABS 0.0 02/13/2021 0542   BASOSABS 0.0 10/01/2017 1115   CMP Latest Ref Rng & Units 02/13/2021 02/12/2021 02/11/2021  Glucose 70 - 99 mg/dL 97 95 100(H)  BUN 6 - 20 mg/dL 8 10 9   Creatinine 0.44 - 1.00 mg/dL 0.57 0.51 0.57  Sodium 135 - 145 mmol/L 137 139 137  Potassium 3.5 - 5.1 mmol/L 3.7 4.0 3.3(L)  Chloride 98 - 111 mmol/L 107 112(H) 106  CO2 22 - 32 mmol/L 25 24 24   Calcium 8.9 - 10.3 mg/dL 8.9 8.5(L) 8.6(L)  Total Protein 6.5 - 8.1 g/dL 5.2(L) - 5.8(L)  Total Bilirubin 0.3 - 1.2 mg/dL 0.7 - 0.6  Alkaline Phos 38 - 126 U/L 57 - 68  AST 15 - 41 U/L 17 - 39  ALT 0 - 44 U/L 35 - 61(H)     Radiology Studies: No results found.   Scheduled Meds: . pantoprazole (PROTONIX) IV  40 mg Intravenous Q12H  . sodium chloride flush  3 mL Intravenous Q12H  . cyanocobalamin  1,000 mcg Oral Daily   Continuous Infusions: . sodium chloride 20 mL/hr at 02/12/21 2136  . dextrose       LOS: 1 day   Time spent: [place], MD [facility] To contact the attending provider between 7A-7P or the covering provider during after hours 7P-7A, please log into the web site [email or web address] and access using universal [place] password for that web site. If you do not have the password, please call the hospital operator.  02/13/2021, 10:38 AM

## 2021-02-13 NOTE — Anesthesia Preprocedure Evaluation (Signed)
Anesthesia Evaluation  Patient identified by MRN, date of birth, ID band Patient awake    Reviewed: Allergy & Precautions, H&P , NPO status , Patient's Chart, lab work & pertinent test results  Airway Mallampati: II   Neck ROM: full    Dental   Pulmonary neg pulmonary ROS,    breath sounds clear to auscultation       Cardiovascular negative cardio ROS   Rhythm:regular Rate:Normal     Neuro/Psych    GI/Hepatic GERD  ,GI bleeding   Endo/Other    Renal/GU      Musculoskeletal   Abdominal   Peds  Hematology  (+) Blood dyscrasia, anemia ,   Anesthesia Other Findings   Reproductive/Obstetrics                             Anesthesia Physical Anesthesia Plan  ASA: II  Anesthesia Plan: MAC   Post-op Pain Management:    Induction: Intravenous  PONV Risk Score and Plan: 2 and Propofol infusion and Treatment may vary due to age or medical condition  Airway Management Planned: Nasal Cannula  Additional Equipment:   Intra-op Plan:   Post-operative Plan:   Informed Consent: I have reviewed the patients History and Physical, chart, labs and discussed the procedure including the risks, benefits and alternatives for the proposed anesthesia with the patient or authorized representative who has indicated his/her understanding and acceptance.     Dental advisory given  Plan Discussed with: CRNA, Anesthesiologist and Surgeon  Anesthesia Plan Comments:         Anesthesia Quick Evaluation

## 2021-02-14 LAB — COMPREHENSIVE METABOLIC PANEL
ALT: 31 U/L (ref 0–44)
AST: 16 U/L (ref 15–41)
Albumin: 3.1 g/dL — ABNORMAL LOW (ref 3.5–5.0)
Alkaline Phosphatase: 59 U/L (ref 38–126)
Anion gap: 7 (ref 5–15)
BUN: 9 mg/dL (ref 6–20)
CO2: 26 mmol/L (ref 22–32)
Calcium: 9 mg/dL (ref 8.9–10.3)
Chloride: 104 mmol/L (ref 98–111)
Creatinine, Ser: 0.71 mg/dL (ref 0.44–1.00)
GFR, Estimated: >60 mL/min (ref >60)
Glucose, Bld: 97 mg/dL (ref 70–99)
Potassium: 3.9 mmol/L (ref 3.5–5.1)
Sodium: 137 mmol/L (ref 135–145)
Total Bilirubin: 0.6 mg/dL (ref 0.3–1.2)
Total Protein: 5.3 g/dL — ABNORMAL LOW (ref 6.5–8.1)

## 2021-02-14 LAB — CBC WITH DIFFERENTIAL/PLATELET
Abs Immature Granulocytes: 0.04 10*3/uL (ref 0.00–0.07)
Basophils Absolute: 0 10*3/uL (ref 0.0–0.1)
Basophils Relative: 0 %
Eosinophils Absolute: 0.2 10*3/uL (ref 0.0–0.5)
Eosinophils Relative: 3 %
HCT: 30.5 % — ABNORMAL LOW (ref 36.0–46.0)
Hemoglobin: 10.1 g/dL — ABNORMAL LOW (ref 12.0–15.0)
Immature Granulocytes: 1 %
Lymphocytes Relative: 24 %
Lymphs Abs: 1.9 10*3/uL (ref 0.7–4.0)
MCH: 30.8 pg (ref 26.0–34.0)
MCHC: 33.1 g/dL (ref 30.0–36.0)
MCV: 93 fL (ref 80.0–100.0)
Monocytes Absolute: 0.5 10*3/uL (ref 0.1–1.0)
Monocytes Relative: 6 %
Neutro Abs: 5.3 10*3/uL (ref 1.7–7.7)
Neutrophils Relative %: 66 %
Platelets: 253 10*3/uL (ref 150–400)
RBC: 3.28 MIL/uL — ABNORMAL LOW (ref 3.87–5.11)
RDW: 20.2 % — ABNORMAL HIGH (ref 11.5–15.5)
WBC: 8 10*3/uL (ref 4.0–10.5)
nRBC: 0 % (ref 0.0–0.2)

## 2021-02-14 LAB — RETICULOCYTES
Immature Retic Fract: 25.8 % — ABNORMAL HIGH (ref 2.3–15.9)
RBC.: 3.28 MIL/uL — ABNORMAL LOW (ref 3.87–5.11)
Retic Count, Absolute: 269 10*3/uL — ABNORMAL HIGH (ref 19.0–186.0)
Retic Ct Pct: 8.2 % — ABNORMAL HIGH (ref 0.4–3.1)

## 2021-02-14 LAB — IRON AND TIBC
Iron: 36 ug/dL (ref 28–170)
Saturation Ratios: 12 % (ref 10.4–31.8)
TIBC: 295 ug/dL (ref 250–450)
UIBC: 259 ug/dL

## 2021-02-14 LAB — FERRITIN: Ferritin: 123 ng/mL (ref 11–307)

## 2021-02-14 MED ORDER — ALPRAZOLAM 0.25 MG PO TABS
0.2500 mg | ORAL_TABLET | Freq: Two times a day (BID) | ORAL | Status: DC | PRN
  Administered 2021-02-14 – 2021-02-22 (×3): 0.25 mg via ORAL
  Filled 2021-02-14 (×3): qty 1

## 2021-02-14 NOTE — Progress Notes (Signed)
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 Almira Bar GI bleeds and duodenum at that time between 3/10-3/10/2021-Rx 1U PRBC-discharge hemoglobin 7  Prior pathology 07/06/2020 on endoscopy = duodenitis without concerning findings Husband/significant other is volunteer EMS-tells me patient felt lightheaded after discharge 3/12-syncopized  Patient was getting iron infusions under care of Dr. Tasia Catchings oncology until January and then stopped-patient apparently has also had a capsule study in the past and her index endoscopy a year ago by Dr. Alice Reichert revealed 8 areas that were cauterized  Represent [facility] ED tachycardia, lightheaded, diaphoretic-syncopized-confused post ictal appearing for several minutes Hemoglobin 5.8 Pregnancy testing negative  Transfused 2 units PRBC Dr. Benson Norway GI evaluated   Hospital-Problem based course  Recurrent GI bleed?  AVMs Enteroscopy 3/16 Dr. 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 Delsa Sale, GYN oncology will evaluate in the morning  BMI 23 Severe iron deficiency anemia previously TSAT 12 iron 36-hold IV iron at this time    DVT prophylaxis: SCD Code Status: Full Family Communication: Long discussion at bedside Raeann Offner [phone] her husband in addition to mother and sister who are at the bedside Disposition is unclear patient may need surgery versus outpatient follow-up-there is a risk of rebleeding and low hemoglobin  Disposition:  Status is: Inpatient  Remains  inpatient appropriate because:Hemodynamically unstable, Persistent severe electrolyte disturbances and IV treatments appropriate due to intensity of illness or inability to take PO   Dispo: The patient is from: Home              Anticipated d/c is to: Home              Patient currently is not medically stable to d/c.   Difficult to place patient No       Consultants:   Dr. Benson Norway gastroenterology  Procedures: Enteroscopy scheduled for 3/16  Antimicrobials: None currently   Subjective:  Patient awake alert coherent but tearful at diagnosis Asks if her 2 children can visit Revisited later to determine next steps they are aware of further plans as per general surgery and I informed them that GYN oncology will be seeing her Ate a sandwich earlier today no nausea no vomiting no dark stool  Objective: Vitals:   02/13/21 2300 02/14/21 0513 02/14/21 1143 02/14/21 1658  BP: (!) 85/64 90/65 95/62  108/71  Pulse: 64 85 70 88  Resp: 16 16 16 18   Temp: 98.5 F (36.9 C) 98.1 F (36.7 C) 97.6 F (36.4 C) 98.3 F (36.8 C)  TempSrc: Oral Oral Oral Oral  SpO2: 98% 99% 100% 99%  Weight:      Height:        Intake/Output Summary (Last 24 hours) at 02/14/2021 1830 Last data filed at 02/14/2021 1400 Gross per 24 hour  Intake 1775.53 ml  Output --  Net 1775.53 ml   Filed Weights   02/11/21 1925 02/13/21 1211  Weight: 57.2 kg 57.2 kg    Examination:  younger than stated age mild pallor no icterus No lymphadenopathy S1-S2 no murmur CTA B no added sound rales rhonchi Abdomen soft  no rebound guarding bowel sounds heard    Data Reviewed: personally reviewed   Data  BUNs/creatinine 9/0.7 Iron 36 saturation ratio is 12 Hemoglobin 10 WBC 8  CBC    Component Value Date/Time   WBC 8.0 02/14/2021 0141   RBC 3.28 (L) 02/14/2021 0141   RBC 3.28 (L) 02/14/2021 0141   HGB 10.1 (L) 02/14/2021 0141   HGB 13.1 10/01/2017 1115   HCT 30.5 (L) 02/14/2021 0141   HCT 39.8 10/01/2017  1115   PLT 253 02/14/2021 0141   PLT 307 10/01/2017 1115   MCV 93.0 02/14/2021 0141   MCV 93 10/01/2017 1115   MCH 30.8 02/14/2021 0141   MCHC 33.1 02/14/2021 0141   RDW 20.2 (H) 02/14/2021 0141   RDW 13.7 10/01/2017 1115   LYMPHSABS 1.9 02/14/2021 0141   LYMPHSABS 2.1 10/01/2017 1115   MONOABS 0.5 02/14/2021 0141   EOSABS 0.2 02/14/2021 0141   EOSABS 0.1 10/01/2017 1115   BASOSABS 0.0 02/14/2021 0141   BASOSABS 0.0 10/01/2017 1115   CMP Latest Ref Rng & Units 02/14/2021 02/13/2021 02/12/2021  Glucose 70 - 99 mg/dL 97 97 95  BUN 6 - 20 mg/dL 9 8 10   Creatinine 0.44 - 1.00 mg/dL 0.71 0.57 0.51  Sodium 135 - 145 mmol/L 137 137 139  Potassium 3.5 - 5.1 mmol/L 3.9 3.7 4.0  Chloride 98 - 111 mmol/L 104 107 112(H)  CO2 22 - 32 mmol/L 26 25 24   Calcium 8.9 - 10.3 mg/dL 9.0 8.9 8.5(L)  Total Protein 6.5 - 8.1 g/dL 5.3(L) 5.2(L) -  Total Bilirubin 0.3 - 1.2 mg/dL 0.6 0.7 -  Alkaline Phos 38 - 126 U/L 59 57 -  AST 15 - 41 U/L 16 17 -  ALT 0 - 44 U/L 31 35 -     Radiology Studies: CT Angio Abd/Pel w/ and/or w/o  Result Date: 02/14/2021 CLINICAL DATA:  45 year old with history of GI bleeding, 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 Patient has been treated for a jejunal AVMs. Hemoclips were recently placed. CTA was performed in order to further evaluate the areas of bleeding. EXAM: CTA ABDOMEN AND PELVIS WITHOUT AND WITH CONTRAST TECHNIQUE: Multidetector CT imaging of the abdomen and pelvis was performed using the standard protocol during bolus administration of intravenous contrast. Multiplanar reconstructed images and MIPs were obtained and reviewed to evaluate the vascular anatomy. CONTRAST:  170mL OMNIPAQUE IOHEXOL 300 MG/ML  SOLN COMPARISON:  None. FINDINGS: VASCULAR Aorta: Normal caliber aorta without aneurysm, dissection, vasculitis or significant stenosis. Celiac: Patent without evidence of aneurysm, dissection, vasculitis or significant stenosis. SMA: Patent without evidence of aneurysm,  dissection, vasculitis or significant stenosis. Renals: Limited evaluation of the renal arteries due to motion artifact in this area. There appears to be flow in bilateral renal arteries but the detail is markedly limited. IMA: Patent without evidence of aneurysm, dissection, vasculitis or significant stenosis. Inflow: Patent without evidence of aneurysm, dissection, vasculitis or significant stenosis. Proximal Outflow: Proximal femoral arteries are patent bilaterally. Veins: Enlarged venous structures in the pelvis surrounding the soft tissue mass. The mesenteric veins in the abdomen are enlarged and there is narrowing of the mesenteric veins near the proximal aspect of the SMV on sequence 10, image 84. Extensive vascular shunting from the pelvic mass with early filling of the mesenteric veins. No evidence for venous thrombosis. Splenic vein and portal veins are patent. Hepatic veins are patent. Review of the MIP images confirms the above findings. NON-VASCULAR Lower chest: Lung bases are clear.  No  pleural effusions. Hepatobiliary: Normal appearance of the liver and gallbladder. No biliary dilatation. Pancreas: Unremarkable. No pancreatic ductal dilatation or surrounding inflammatory changes. Spleen: Normal in size without focal abnormality. Adrenals/Urinary Tract: Adrenal tissue is difficult to visualize but no evidence for an adrenal mass or lesion. Normal appearance of both kidneys without hydronephrosis. Negative for kidney stones. Small amount of fluid in the urinary bladder. Bladder is displaced by the large mass in the lower abdomen and pelvis. Stomach/Bowel: There appears to be metallic clip in the rectum which is probably from previous endoscopic procedures. Normal appearance of the appendix. Stomach is distended with large amount of fluid. There is no evidence for focal bowel wall thickening. There are endoscopic clips within the jejunum in the mid lower abdomen on sequence 4, image 97. These clips are  adjacent to the large mass in the pelvis. Difficult to differentiate loops of small bowel from the mass in the pelvis. Lymphatic: No significant lymph node enlargement in the abdomen and pelvis Reproductive: 4.2 cm low-density structure in the region of the left adnexa. Uterus is slightly displaced towards the left side and adjacent to the large mass in the right central pelvis. Peripherally enhancing low-density structure in the right adnexal region measuring 1.9 cm is suggestive for a corpus luteum. Other: Avidly enhancing heterogeneous mass in the right central aspect of the pelvis and lower abdomen. This mass measures 10.0 x 8.0 x 9.0 cm. Structure is very vascular with early venous drainage during the arterial phase of imaging. Negative for ascites. There is no evidence for omental or peritoneal nodularity. Musculoskeletal: Small focus of sclerosis along the posterior aspect of the L4 vertebral body but favor benign etiology such as a bone island. No suspicious bone abnormalities. IMPRESSION: 1. Hypervascular mass in the right central pelvis, measuring up to 10.0 cm. There is early venous shunting associated with this soft tissue mass and there is venous engorgement of the mesenteric veins. The etiology for the soft tissue mass is uncertain but a GIST would be in the differential diagnosis. Soft tissue mass is difficult to separate from the uterus and adnexal structures and a primary gynecologic malignancy cannot be completely excluded. No evidence for lymphadenopathy or peritoneal nodularity. Recommend surgical oncology consultation. 2. Distension of the stomach.  No evidence for a bowel obstruction. 3. 4.2 cm low-density structure in the left adnexa region could represent an ovarian cyst but nonspecific. These results were called by telephone at the time of interpretation on 02/14/2021 at 11:33 am to provider PATRICK HUNG , who verbally acknowledged these results. Electronically Signed   By: Markus Daft M.D.    On: 02/14/2021 11:38     Scheduled Meds: . pantoprazole (PROTONIX) IV  40 mg Intravenous Q12H  . sodium chloride flush  3 mL Intravenous Q12H  . cyanocobalamin  1,000 mcg Oral Daily   Continuous Infusions: . dextrose 75 mL/hr at 02/13/21 2301     LOS: 2 days   Time spent: [place], MD [facility] To contact the attending provider between 7A-7P or the covering provider during after hours 7P-7A, please log into the web site [email or web address] and access using universal [place] password for that web site. If you do not have the password, please call the hospital operator.  02/14/2021, 6:30 PM

## 2021-02-14 NOTE — Consult Note (Addendum)
[facility] Surgery Consult Note  Paige Robertson 1976/03/31  [id number].    Requesting MD: Carol Ada Chief Complaint: Dizziness and warm with syncope. 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  She underwent argon laser treatment and clip placement at [facility].  She stabilized and was discharged home.  She has also had bleeds in 07/2020, 10/2020, 02/08/20 and now on 02/11/2021 she presented to the ED after becoming hot and dizzy. She had a syncopal episode at a softball game.  She presented to the ED here where she was found to be hypotensive blood pressure down as low as 84/56.  She was moderately tachycardic.  Labs showed hemoglobin of 6.1, hematocrit of 19.8, WBC 9.2, platelets 192,000.  CMP showed a chloride of 112, calcium 8.5, albumin 3.4, AST 39, ALT 61, total protein 5.8, total bilirubin 0.6.  Patient was transfused and seen in consultation by Dr. Carol Ada.  She underwent repeat EGD on 02/13/2021.  The esophagus and stomach were normal.  The duodenum was normal.  There was one nonbleeding superficial jejunal ulcer with no stigmata of bleeding the lesion was 10 mm in largest diameter and was treated with 4 hemostatic clips.  Dr. Benson Norway requested IR evaluation for CT angio and possible embolization.   This is the first CT she has had.  She reports a 10 pound weight loss since January, and a chronic vaginal discharge for some time.  She is not sure how long, but she wears pads daily for some time.     A CT abdomen pelvis with and without contrast scan was then obtained.  This showed a hypervascular mass in the right central pelvis measuring 10 cm.  There is early venous shunting associated with the soft tissue and venous engorgement of the mesenteric veins.  The soft tissue mass is unknown but a GIST tumor  would be in the differential.  Soft tissue mass is difficult to separate from the uterus and the adnexal structures so a primary gynecologic malignancy cannot be excluded either.  No evidence for lymphadenopathy or peritoneal nodularity.  There is distention of the stomach, but no distention of the bowel, no evidence of SBO 4.2 cm low-density structure in the left adnexa could represent an ovarian cyst but was nonspecific.  EGD/small bowel enteroscopy 02/13/2021: - Normal esophagus. - Normal stomach. - Normal examined duodenum. - Non-bleeding jejunal ulcer with no stigmata of bleeding Clips (MR conditional) were placed. Injected. - A tattoo was seen in the jejunum. - No specimens collected.  ROS: Review of Systems  Constitutional: Positive for weight loss (10 pound weight loss since January 2022). Negative for chills and fever.  HENT: Negative.   Eyes: Negative.   Respiratory: Negative.   Cardiovascular: Negative.   Gastrointestinal: Negative for abdominal pain, diarrhea, heartburn, nausea and vomiting.       Last stool black and formed.  She has not seen any blood in her stool  Genitourinary: Negative for dysuria, flank pain, frequency, hematuria and urgency.       She has a chronic white colored vaginal drainage, she is not sure how long this has been going on, but she wears a pad all the time.    Musculoskeletal: Negative.   Skin: Negative.   Neurological: Negative.        She has had 2 episodes of syncope the  last brought her in with her Hbg of 6  Endo/Heme/Allergies: Negative.   Psychiatric/Behavioral: Negative.        She and her husband are both pretty anxious right now  All other systems reviewed and are negative.   Family History  Problem Relation Age of Onset  . Diabetes Father   . Stroke Father   . Breast cancer Maternal Grandmother   . Breast cancer Paternal Aunt   . High Cholesterol Mother   . Colon cancer Neg Hx   . Ovarian cancer Neg Hx   . Heart disease Neg Hx      Past Medical History:  Diagnosis Date  . Acid reflux   . Hematuria   . Hyperlipemia   . IDA (iron deficiency anemia)   . Melena   . Perimenopausal symptoms   . Vaginal discharge     Past Surgical History:  Procedure Laterality Date  . COLONOSCOPY WITH ESOPHAGOGASTRODUODENOSCOPY (EGD)    . ENTEROSCOPY N/A 07/09/2020   Procedure: ENTEROSCOPY;  Surgeon: Lin Landsman, MD;  Location: [facility] ENDOSCOPY;  Service: Gastroenterology;  Laterality: N/A;  . ENTEROSCOPY N/A 02/07/2021   Procedure: ENTEROSCOPY;  Surgeon: Lin Landsman, MD;  Location: [facility] ENDOSCOPY;  Service: Gastroenterology;  Laterality: N/A;  . ESOPHAGOGASTRODUODENOSCOPY (EGD) WITH PROPOFOL N/A 07/06/2020   Procedure: ESOPHAGOGASTRODUODENOSCOPY (EGD) WITH PROPOFOL;  Surgeon: Lucilla Lame, MD;  Location: [facility] ENDOSCOPY;  Service: Endoscopy;  Laterality: N/A;  . ESOPHAGOGASTRODUODENOSCOPY (EGD) WITH PROPOFOL N/A 11/19/2020   Procedure: ESOPHAGOGASTRODUODENOSCOPY (EGD) WITH PROPOFOL;  Surgeon: Toledo, Benay Pike, MD;  Location: [facility] ENDOSCOPY;  Service: Gastroenterology;  Laterality: N/A;  . GIVENS CAPSULE STUDY N/A 07/08/2020   Procedure: GIVENS CAPSULE STUDY;  Surgeon: Virgel Manifold, MD;  Location: [facility] ENDOSCOPY;  Service: Endoscopy;  Laterality: N/A;  Administer between 6-8 pm today  . NO PAST SURGERIES      Social History:  reports that she has never smoked. She has never used smokeless tobacco. She reports that she does not drink alcohol and does not use drugs.  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  Allergies:  Allergies  Allergen Reactions  . Feraheme [Ferumoxytol] Anaphylaxis  . Sulfa Antibiotics Other (See Comments)    Childhood allergy, unknown reaction    Medications Prior to Admission  Medication Sig Dispense Refill  . cetirizine (ZYRTEC) 10 MG tablet Take 10 mg by mouth daily.    . ferrous sulfate 325 (65 FE) MG tablet Take 1 tablet (325 mg total) by mouth daily with  breakfast. Can get any over-the-counter iron supplement.  3  . pantoprazole (PROTONIX) 40 MG tablet Take 1 tablet (40 mg total) by mouth 2 (two) times daily. 60 tablet 0  . vitamin B-12 1000 MCG tablet Take 1 tablet (1,000 mcg total) by mouth daily. Can get any over-the-counter Vitamin B-12 supplement.      Blood pressure 95/62, pulse 70, temperature 97.6 F (36.4 C), temperature source Oral, resp. rate 16, height 5\' 2"  (1.575 m), weight 57.2 kg, last menstrual period 01/24/2021, SpO2 100 %. Physical Exam:  General: pleasant, WD, WN white female who is laying in bed in NAD HEENT: head is normocephalic, atraumatic.  Sclera are noninjected.  Pupils are equal.  Ears and nose without any masses or lesions.  Mouth is pink and moist Heart: regular, rate, and rhythm.  Normal s1,s2. No obvious murmurs, gallops, or rubs noted.  Palpable radial and pedal pulses bilaterally Lungs: CTAB, no wheezes, rhonchi, or rales noted.  Respiratory effort nonlabored  Abd: soft, NT, ND, +BS, no masses, hernias, or organomegaly MS: all 4 extremities are symmetrical with no cyanosis, clubbing, or edema. Skin: warm and dry with no masses, lesions, or rashes Neuro: Cranial nerves 2-12 grossly intact, sensation is normal throughout Psych: A&Ox3 with an appropriate affect.    Results for orders placed or performed during the hospital encounter of 02/11/21 (from the past 48 hour(s))  Glucose, capillary     Status: Abnormal   Collection Time: 02/12/21  7:30 PM  Result Value Ref Range   Glucose-Capillary 60 (L) 70 - 99 mg/dL    Comment: Glucose reference range applies only to samples taken after fasting for at least 8 hours.  Glucose, capillary     Status: Abnormal   Collection Time: 02/12/21  8:04 PM  Result Value Ref Range   Glucose-Capillary 114 (H) 70 - 99 mg/dL    Comment: Glucose reference range applies only to samples taken after fasting for at least 8 hours.  Hemoglobin and hematocrit, blood     Status: Abnormal    Collection Time: 02/12/21  8:52 PM  Result Value Ref Range   Hemoglobin 9.8 (L) 12.0 - 15.0 g/dL   HCT 30.1 (L) 36.0 - 46.0 %    Comment: Performed at [facility], [place] [address] [place], [place] [address]  CBC with Differential/Platelet     Status: Abnormal   Collection Time: 02/13/21  5:42 AM  Result Value Ref Range   WBC 5.8 4.0 - 10.5 K/uL   RBC 3.08 (L) 3.87 - 5.11 MIL/uL   Hemoglobin 9.5 (L) 12.0 - 15.0 g/dL   HCT 28.2 (L) 36.0 - 46.0 %   MCV 91.6 80.0 - 100.0 fL   MCH 30.8 26.0 - 34.0 pg   MCHC 33.7 30.0 - 36.0 g/dL   RDW 20.3 (H) 11.5 - 15.5 %   Platelets 204 150 - 400 K/uL   nRBC 0.0 0.0 - 0.2 %   Neutrophils Relative % 58 %   Neutro Abs 3.4 1.7 - 7.7 K/uL   Lymphocytes Relative 31 %   Lymphs Abs 1.8 0.7 - 4.0 K/uL   Monocytes Relative 6 %   Monocytes Absolute 0.4 0.1 - 1.0 K/uL   Eosinophils Relative 3 %   Eosinophils Absolute 0.2 0.0 - 0.5 K/uL   Basophils Relative 1 %   Basophils Absolute 0.0 0.0 - 0.1 K/uL   Immature Granulocytes 1 %   Abs Immature Granulocytes 0.04 0.00 - 0.07 K/uL    Comment: Performed at [facility] [address] [place], [place] [address]  Comprehensive metabolic panel     Status: Abnormal   Collection Time: 02/13/21  5:42 AM  Result Value Ref Range   Sodium 137 135 - 145 mmol/L   Potassium 3.7 3.5 - 5.1 mmol/L   Chloride 107 98 - 111 mmol/L   CO2 25 22 - 32 mmol/L   Glucose, Bld 97 70 - 99 mg/dL    Comment: Glucose reference range applies only to samples taken after fasting for at least 8 hours.   BUN 8 6 - 20 mg/dL   Creatinine, Ser 0.57 0.44 - 1.00 mg/dL   Calcium 8.9 8.9 - 10.3 mg/dL   Total Protein 5.2 (L) 6.5 - 8.1 g/dL   Albumin 3.0 (L) 3.5 - 5.0 g/dL   AST 17 15 - 41 U/L   ALT 35 0 - 44 U/L   Alkaline Phosphatase 57 38 - 126 U/L   Total Bilirubin  0.7 0.3 - 1.2 mg/dL   GFR, Estimated >60 >60 mL/min    Comment: (NOTE) Calculated using the CKD-EPI Creatinine Equation (2021)    Anion gap 5 5 - 15     Comment: Performed at [facility] [address] [place], [place] [address]  Magnesium     Status: None   Collection Time: 02/13/21  5:42 AM  Result Value Ref Range   Magnesium 2.3 1.7 - 2.4 mg/dL    Comment: Performed at [facility] [address], [place], [place] [address]  Phosphorus     Status: None   Collection Time: 02/13/21  5:42 AM  Result Value Ref Range   Phosphorus 3.0 2.5 - 4.6 mg/dL    Comment: Performed at [facility] [address], [place], [place] [address]  hCG, serum, qualitative     Status: None   Collection Time: 02/13/21  5:42 AM  Result Value Ref Range   Preg, Serum NEGATIVE NEGATIVE    Comment:        THE SENSITIVITY OF THIS METHODOLOGY IS >10 mIU/mL. Performed at [facility], [place] [address], [place], [place] [address]   Glucose, capillary     Status: None   Collection Time: 02/13/21  8:07 AM  Result Value Ref Range   Glucose-Capillary 86 70 - 99 mg/dL    Comment: Glucose reference range applies only to samples taken after fasting for at least 8 hours.  Glucose, capillary     Status: None   Collection Time: 02/13/21 12:22 PM  Result Value Ref Range   Glucose-Capillary 95 70 - 99 mg/dL    Comment: Glucose reference range applies only to samples taken after fasting for at least 8 hours.  Iron and TIBC     Status: None   Collection Time: 02/14/21  1:41 AM  Result Value Ref Range   Iron 36 28 - 170 ug/dL   TIBC 295 250 - 450 ug/dL   Saturation Ratios 12 10.4 - 31.8 %   UIBC 259 ug/dL    Comment: Performed at [facility], [place] [address], [place], [place] [address]  Reticulocytes     Status: Abnormal   Collection Time: 02/14/21  1:41 AM  Result Value Ref Range   Retic Ct Pct 8.2 (H) 0.4 - 3.1 %   RBC. 3.28 (L) 3.87 - 5.11 MIL/uL   Retic Count, Absolute 269.0 (H) 19.0 - 186.0 K/uL   Immature Retic Fract 25.8 (H) 2.3 - 15.9 %    Comment: Performed at [facility] [address] [place], [place] [address]   Ferritin     Status: None   Collection Time: 02/14/21  1:41 AM  Result Value Ref Range   Ferritin 123 11 - 307 ng/mL    Comment: Performed at [facility], [place] [address], [place] [address]  CBC with Differential/Platelet     Status: Abnormal   Collection Time: 02/14/21  1:41 AM  Result Value Ref Range   WBC 8.0 4.0 - 10.5 K/uL   RBC 3.28 (L) 3.87 - 5.11 MIL/uL   Hemoglobin 10.1 (L) 12.0 - 15.0 g/dL   HCT 30.5 (L) 36.0 - 46.0 %   MCV 93.0 80.0 - 100.0 fL   MCH 30.8 26.0 - 34.0 pg   MCHC 33.1 30.0 - 36.0 g/dL   RDW 20.2 (H) 11.5 - 15.5 %   Platelets 253 150 - 400 K/uL  nRBC 0.0 0.0 - 0.2 %   Neutrophils Relative % 66 %   Neutro Abs 5.3 1.7 - 7.7 K/uL   Lymphocytes Relative 24 %   Lymphs Abs 1.9 0.7 - 4.0 K/uL   Monocytes Relative 6 %   Monocytes Absolute 0.5 0.1 - 1.0 K/uL   Eosinophils Relative 3 %   Eosinophils Absolute 0.2 0.0 - 0.5 K/uL   Basophils Relative 0 %   Basophils Absolute 0.0 0.0 - 0.1 K/uL   Immature Granulocytes 1 %   Abs Immature Granulocytes 0.04 0.00 - 0.07 K/uL    Comment: Performed at [facility] [address], [place], [place] [address]  Comprehensive metabolic panel     Status: Abnormal   Collection Time: 02/14/21  1:41 AM  Result Value Ref Range   Sodium 137 135 - 145 mmol/L   Potassium 3.9 3.5 - 5.1 mmol/L   Chloride 104 98 - 111 mmol/L   CO2 26 22 - 32 mmol/L   Glucose, Bld 97 70 - 99 mg/dL    Comment: Glucose reference range applies only to samples taken after fasting for at least 8 hours.   BUN 9 6 - 20 mg/dL   Creatinine, Ser 0.71 0.44 - 1.00 mg/dL   Calcium 9.0 8.9 - 10.3 mg/dL   Total Protein 5.3 (L) 6.5 - 8.1 g/dL   Albumin 3.1 (L) 3.5 - 5.0 g/dL   AST 16 15 - 41 U/L   ALT 31 0 - 44 U/L   Alkaline Phosphatase 59 38 - 126 U/L   Total Bilirubin 0.6 0.3 - 1.2 mg/dL   GFR, Estimated >60 >60 mL/min    Comment: (NOTE) Calculated using the CKD-EPI Creatinine Equation (2021)    Anion gap 7 5 - 15    Comment:  Performed at [facility], [place] [address] [place],  [address]   CT Angio Abd/Pel w/ and/or w/o  Result Date: 02/14/2021 CLINICAL DATA:  45 year old with history of GI bleeding, 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 Patient has been treated for a jejunal AVMs. Hemoclips were recently placed. CTA was performed in order to further evaluate the areas of bleeding. EXAM: CTA ABDOMEN AND PELVIS WITHOUT AND WITH CONTRAST TECHNIQUE: Multidetector CT imaging of the abdomen and pelvis was performed using the standard protocol during bolus administration of intravenous contrast. Multiplanar reconstructed images and MIPs were obtained and reviewed to evaluate the vascular anatomy. CONTRAST:  164mL OMNIPAQUE IOHEXOL 300 MG/ML  SOLN COMPARISON:  None. FINDINGS: VASCULAR Aorta: Normal caliber aorta without aneurysm, dissection, vasculitis or significant stenosis. Celiac: Patent without evidence of aneurysm, dissection, vasculitis or significant stenosis. SMA: Patent without evidence of aneurysm, dissection, vasculitis or significant stenosis. Renals: Limited evaluation of the renal arteries due to motion artifact in this area. There appears to be flow in bilateral renal arteries but the detail is markedly limited. IMA: Patent without evidence of aneurysm, dissection, vasculitis or significant stenosis. Inflow: Patent without evidence of aneurysm, dissection, vasculitis or significant stenosis. Proximal Outflow: Proximal femoral arteries are patent bilaterally. Veins: Enlarged venous structures in the pelvis surrounding the soft tissue mass. The mesenteric veins in the abdomen are enlarged and there is narrowing of the mesenteric veins near the proximal aspect of the SMV on sequence 10, image 84. Extensive vascular shunting from the pelvic mass with early filling of the mesenteric veins. No evidence for venous thrombosis. Splenic vein and portal veins are patent. Hepatic veins are patent. Review of the MIP images  confirms  the above findings. NON-VASCULAR Lower chest: Lung bases are clear.  No pleural effusions. Hepatobiliary: Normal appearance of the liver and gallbladder. No biliary dilatation. Pancreas: Unremarkable. No pancreatic ductal dilatation or surrounding inflammatory changes. Spleen: Normal in size without focal abnormality. Adrenals/Urinary Tract: Adrenal tissue is difficult to visualize but no evidence for an adrenal mass or lesion. Normal appearance of both kidneys without hydronephrosis. Negative for kidney stones. Small amount of fluid in the urinary bladder. Bladder is displaced by the large mass in the lower abdomen and pelvis. Stomach/Bowel: There appears to be metallic clip in the rectum which is probably from previous endoscopic procedures. Normal appearance of the appendix. Stomach is distended with large amount of fluid. There is no evidence for focal bowel wall thickening. There are endoscopic clips within the jejunum in the mid lower abdomen on sequence 4, image 97. These clips are adjacent to the large mass in the pelvis. Difficult to differentiate loops of small bowel from the mass in the pelvis. Lymphatic: No significant lymph node enlargement in the abdomen and pelvis Reproductive: 4.2 cm low-density structure in the region of the left adnexa. Uterus is slightly displaced towards the left side and adjacent to the large mass in the right central pelvis. Peripherally enhancing low-density structure in the right adnexal region measuring 1.9 cm is suggestive for a corpus luteum. Other: Avidly enhancing heterogeneous mass in the right central aspect of the pelvis and lower abdomen. This mass measures 10.0 x 8.0 x 9.0 cm. Structure is very vascular with early venous drainage during the arterial phase of imaging. Negative for ascites. There is no evidence for omental or peritoneal nodularity. Musculoskeletal: Small focus of sclerosis along the posterior aspect of the L4 vertebral body but favor benign  etiology such as a bone island. No suspicious bone abnormalities. IMPRESSION: 1. Hypervascular mass in the right central pelvis, measuring up to 10.0 cm. There is early venous shunting associated with this soft tissue mass and there is venous engorgement of the mesenteric veins. The etiology for the soft tissue mass is uncertain but a GIST would be in the differential diagnosis. Soft tissue mass is difficult to separate from the uterus and adnexal structures and a primary gynecologic malignancy cannot be completely excluded. No evidence for lymphadenopathy or peritoneal nodularity. Recommend surgical oncology consultation. 2. Distension of the stomach.  No evidence for a bowel obstruction. 3. 4.2 cm low-density structure in the left adnexa region could represent an ovarian cyst but nonspecific. These results were called by telephone at the time of interpretation on 02/14/2021 at 11:33 am to provider PATRICK HUNG , who verbally acknowledged these results. Electronically Signed   By: Markus Daft M.D.   On: 02/14/2021 11:38   Prior to Admission medications   Medication Sig Start Date End Date Taking? Authorizing Provider  cetirizine (ZYRTEC) 10 MG tablet Take 10 mg by mouth daily.   Yes [provider]  ferrous sulfate 325 (65 FE) MG tablet Take 1 tablet (325 mg total) by mouth daily with breakfast. Can get any over-the-counter iron supplement. 07/09/20  Yes Enzo Bi, MD  pantoprazole (PROTONIX) 40 MG tablet Take 1 tablet (40 mg total) by mouth 2 (two) times daily. 02/09/21 03/11/21 Yes Sharen Hones, MD  vitamin B-12 1000 MCG tablet Take 1 tablet (1,000 mcg total) by mouth daily. Can get any over-the-counter Vitamin B-12 supplement. 07/10/20  Yes Enzo Bi, MD   . 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 Grandville Silos and Dr. Marlou Starks.  It is unclear as to what this is.  On first blush it looks to be a GYN mass.  Radiology called and recommended enterography as the next step to evaluate the CT findings.  We are also getting a  CEA, CA 125, AFP.  She needs a GYN consult also.  This has been discussed with Dr. Verlon Au also.  Dr. Marlou Starks will see her in the AM, and we will follow with you.        Earnstine Regal [facility] Surgery 02/14/2021, 2:07 PM Please see Amion for pager number during day hours 7:00am-4:30pm

## 2021-02-14 NOTE — Progress Notes (Addendum)
Subjective: Feeling well.  No complaints.  Objective: Vital signs in last 24 hours: Temp:  [97.7 F (36.5 C)-98.5 F (36.9 C)] 98.1 F (36.7 C) (03/17 0513) Pulse Rate:  [59-85] 85 (03/17 0513) Resp:  [13-20] 16 (03/17 0513) BP: (83-120)/(47-75) 90/65 (03/17 0513) SpO2:  [98 %-100 %] 99 % (03/17 0513) Weight:  [57.2 kg] 57.2 kg (03/16 1211) Last BM Date: 02/11/21  Intake/Output from previous day: 03/16 0701 - 03/17 0700 In: 500 [I.V.:500] Out: 300 [Urine:300] Intake/Output this shift: No intake/output data recorded.  General appearance: alert and no distress GI: soft, non-tender; bowel sounds normal; no masses,  no organomegaly  Lab Results: Recent Labs    02/12/21 0307 02/12/21 0501 02/12/21 2052 02/13/21 0542 02/14/21 0141  WBC 9.2  --   --  5.8 8.0  HGB 5.8*   < > 9.8* 9.5* 10.1*  HCT 18.4*   < > 30.1* 28.2* 30.5*  PLT 192  --   --  204 253   < > = values in this interval not displayed.   BMET Recent Labs    02/12/21 0307 02/13/21 0542 02/14/21 0141  NA 139 137 137  K 4.0 3.7 3.9  CL 112* 107 104  CO2 24 25 26   GLUCOSE 95 97 97  BUN 10 8 9   CREATININE 0.51 0.57 0.71  CALCIUM 8.5* 8.9 9.0   LFT Recent Labs    02/14/21 0141  PROT 5.3*  ALBUMIN 3.1*  AST 16  ALT 31  ALKPHOS 59  BILITOT 0.6   PT/INR No results for input(s): LABPROT, INR in the last 72 hours. Hepatitis Panel No results for input(s): HEPBSAG, HCVAB, HEPAIGM, HEPBIGM in the last 72 hours. C-Diff No results for input(s): CDIFFTOX in the last 72 hours. Fecal Lactopherrin No results for input(s): FECLLACTOFRN in the last 72 hours.  Studies/Results: No results found.  Medications:  Scheduled: . pantoprazole (PROTONIX) IV  40 mg Intravenous Q12H  . sodium chloride flush  3 mL Intravenous Q12H  . cyanocobalamin  1,000 mcg Oral Daily   Continuous: . dextrose 75 mL/hr at 02/13/21 2301    Assessment/Plan: 1) Jejunal ulcer s/p treatment for AVM. 2) Anemia.   She is feeling  well.  Her HGB is stable.  I spoke with Dr. Deniece Portela yesterday and she will be evaluated by IR today.  A CT scan was performed per their request.  Plan: 1) Await IR evaluation.  ADDENDUM:  I spoke with IR about the results of the CT scan.  There is a large mass in the pelvis that exhibits significant venous flow.  It is adjacent to the site of hemoclipping.  This mass may be a GIST versus a gynecologic source.  General Surgery was consulted to provided some insight/guidance as to her options and other possible consultations.  The results were discussed in detail with the patient and her husband.  LOS: 2 days   Jayln Branscom D 02/14/2021, 10:57 AM

## 2021-02-14 NOTE — Anesthesia Postprocedure Evaluation (Signed)
Anesthesia Post Note  Patient: Paige Robertson  Procedure(s) Performed: ENTEROSCOPY (N/A ) [place]     Patient location during evaluation: Endoscopy Anesthesia Type: MAC Level of consciousness: awake and alert Pain management: pain level controlled Vital Signs Assessment: post-procedure vital signs reviewed and stable Respiratory status: spontaneous breathing, nonlabored ventilation, respiratory function stable and patient connected to nasal cannula oxygen Cardiovascular status: stable and blood pressure returned to baseline Postop Assessment: no apparent nausea or vomiting Anesthetic complications: no   No complications documented.  Last Vitals:  Vitals:   02/13/21 2300 02/14/21 0513  BP: (!) 85/64 90/65  Pulse: 64 85  Resp: 16 16  Temp: 36.9 C 36.7 C  SpO2: 98% 99%    Last Pain:  Vitals:   02/14/21 0513  TempSrc: Oral  PainSc:                  [place]

## 2021-02-14 NOTE — Plan of Care (Signed)
  Problem: Education: Goal: Knowledge of General Education information will improve Description Including pain rating scale, medication(s)/side effects and non-pharmacologic comfort measures Outcome: Progressing   

## 2021-02-15 ENCOUNTER — Encounter (HOSPITAL_COMMUNITY): Payer: Self-pay | Admitting: Gastroenterology

## 2021-02-15 ENCOUNTER — Inpatient Hospital Stay (HOSPITAL_COMMUNITY): Payer: BC Managed Care – PPO

## 2021-02-15 LAB — CBC WITH DIFFERENTIAL/PLATELET
Abs Immature Granulocytes: 0.04 10*3/uL (ref 0.00–0.07)
Basophils Absolute: 0 10*3/uL (ref 0.0–0.1)
Basophils Relative: 1 %
Eosinophils Absolute: 0.2 10*3/uL (ref 0.0–0.5)
Eosinophils Relative: 2 %
HCT: 29.5 % — ABNORMAL LOW (ref 36.0–46.0)
Hemoglobin: 9.9 g/dL — ABNORMAL LOW (ref 12.0–15.0)
Immature Granulocytes: 1 %
Lymphocytes Relative: 24 %
Lymphs Abs: 2.1 10*3/uL (ref 0.7–4.0)
MCH: 31.1 pg (ref 26.0–34.0)
MCHC: 33.6 g/dL (ref 30.0–36.0)
MCV: 92.8 fL (ref 80.0–100.0)
Monocytes Absolute: 0.6 10*3/uL (ref 0.1–1.0)
Monocytes Relative: 7 %
Neutro Abs: 5.8 10*3/uL (ref 1.7–7.7)
Neutrophils Relative %: 65 %
Platelets: 241 10*3/uL (ref 150–400)
RBC: 3.18 MIL/uL — ABNORMAL LOW (ref 3.87–5.11)
RDW: 19.8 % — ABNORMAL HIGH (ref 11.5–15.5)
WBC: 8.8 10*3/uL (ref 4.0–10.5)
nRBC: 0 % (ref 0.0–0.2)

## 2021-02-15 LAB — COMPREHENSIVE METABOLIC PANEL
ALT: 27 U/L (ref 0–44)
AST: 18 U/L (ref 15–41)
Albumin: 3.2 g/dL — ABNORMAL LOW (ref 3.5–5.0)
Alkaline Phosphatase: 64 U/L (ref 38–126)
Anion gap: 6 (ref 5–15)
BUN: 7 mg/dL (ref 6–20)
CO2: 27 mmol/L (ref 22–32)
Calcium: 9.2 mg/dL (ref 8.9–10.3)
Chloride: 105 mmol/L (ref 98–111)
Creatinine, Ser: 0.68 mg/dL (ref 0.44–1.00)
GFR, Estimated: >60 mL/min (ref >60)
Glucose, Bld: 103 mg/dL — ABNORMAL HIGH (ref 70–99)
Potassium: 4 mmol/L (ref 3.5–5.1)
Sodium: 138 mmol/L (ref 135–145)
Total Bilirubin: 0.5 mg/dL (ref 0.3–1.2)
Total Protein: 5.3 g/dL — ABNORMAL LOW (ref 6.5–8.1)

## 2021-02-15 MED ORDER — IOHEXOL 300 MG/ML  SOLN
100.0000 mL | Freq: Once | INTRAMUSCULAR | Status: AC | PRN
  Administered 2021-02-15: 100 mL via INTRAVENOUS

## 2021-02-15 NOTE — Progress Notes (Signed)
Subjective: No complaints.  Objective: Vital signs in last 24 hours: Temp:  [98.1 F (36.7 C)-98.6 F (37 C)] 98.6 F (37 C) (03/18 1143) Pulse Rate:  [69-72] 69 (03/18 1143) Resp:  [16-18] 16 (03/18 1143) BP: (92-103)/(61-64) 103/61 (03/18 1143) SpO2:  [99 %-100 %] 100 % (03/18 1143) Last BM Date: 02/12/21  Intake/Output from previous day: 03/17 0701 - 03/18 0700 In: 2925.5 [P.O.:480; I.V.:2445.5] Out: -  Intake/Output this shift: No intake/output data recorded.  General appearance: alert and no distress GI: soft, non-tender; bowel sounds normal; no masses,  no organomegaly  Lab Results: Recent Labs    02/13/21 0542 02/14/21 0141 02/15/21 0140  WBC 5.8 8.0 8.8  HGB 9.5* 10.1* 9.9*  HCT 28.2* 30.5* 29.5*  PLT 204 253 241   BMET Recent Labs    02/13/21 0542 02/14/21 0141 02/15/21 0140  NA 137 137 138  K 3.7 3.9 4.0  CL 107 104 105  CO2 25 26 27   GLUCOSE 97 97 103*  BUN 8 9 7   CREATININE 0.57 0.71 0.68  CALCIUM 8.9 9.0 9.2   LFT Recent Labs    02/15/21 0140  PROT 5.3*  ALBUMIN 3.2*  AST 18  ALT 27  ALKPHOS 64  BILITOT 0.5   PT/INR No results for input(s): LABPROT, INR in the last 72 hours. Hepatitis Panel No results for input(s): HEPBSAG, HCVAB, HEPAIGM, HEPBIGM in the last 72 hours. C-Diff No results for input(s): CDIFFTOX in the last 72 hours. Fecal Lactopherrin No results for input(s): FECLLACTOFRN in the last 72 hours.  Studies/Results: CT ENTERO ABD/PELVIS W CONTAST  Result Date: 02/15/2021 CLINICAL DATA:  Anemia, GI bleeding with prior enteroscopy and argon plasma coagulator and clipping 02/07/2021 EXAM: CT ABDOMEN AND PELVIS WITH CONTRAST (ENTEROGRAPHY) TECHNIQUE: Multidetector CT of the abdomen and pelvis during bolus administration of intravenous contrast. Negative oral contrast was given. CONTRAST:  151mL OMNIPAQUE IOHEXOL 300 MG/ML  SOLN COMPARISON:  02/13/2021 FINDINGS: Lower chest: Mild reticulonodular opacity in the lingula  favoring atypical or early infectious process. Hepatobiliary: Unremarkable.  The gallbladder appears normal. Pancreas: Unremarkable Spleen: Unremarkable Adrenals/Urinary Tract: Unremarkable Stomach/Bowel: In the right hemipelvis, a heterogeneous 9.2 by 7.5 by 8.6 cm enhancing mass is present with internal and surrounding vessels; there clips along bowel along the upper margin of this lesion for example on image 44 of series 8, and I am uncertain if the mass involves the wall of the jejunum along the clip. Although loops of ileum are also tangential to this mass, there is sharp angulation of a loop of jejunum which extends down into the pelvis into the margin of the mass as shown on image 43 of series 8, suggesting that the jejunal wall may be tethered to this mass. While potentially a jejunal mass, carcinoid, or GI stromal tumor, this mass is also closely associated with the right adnexa and adjacent to the uterus. I am skeptical that this is a fibroid given its irregular lobular margin but ovarian tumor invading the small bowel is not completely excluded. Tributaries from the SMV extend around this mass. No dilated bowel is identified. Otherwise jejunal fold pattern appears normal and there is no additional significant abnormal accentuated mucosal enhancement. Vascular/Lymphatic: No atherosclerosis. No significant pathologic adenopathy. Reproductive: As noted above, the irregular lobulated mass abuts the right uterine fundus and right adnexa. Nonspecific 3.0 by 2.7 cm cystic lesion along the left side of the mass and adjacent to the left adnexa, but probably not arising from the ovary. Other: No  supplemental non-categorized findings. Musculoskeletal: Mild levoconvex lumbar scoliosis. Small sclerotic lesion posteriorly in the L4 vertebral body is probably benign. IMPRESSION: 1. 9.2 by 7.5 by 8.6 cm hypervascular enhancing mass in the right hemipelvis, closely associated with the right uterine fundus and right  adnexa. An angulated loop of jejunum extends down to the pelvis to the margin of this mass, and in sharply angles back up into the upper, and accordingly may be tethered to this mass. The hemoclips in the jejunum are immediately along the angulated inferior margin of this loop of jejunum, at the upper edge of the mass. Possibilities may include carcinoid, GI stromal tumor, other jejunal tumors, or a mass arising from the adjacent uterus/right ovary (although the tumor is not highly characteristic of a fibroid or typical ovarian neoplasm). Tissue diagnosis is recommended. 2. Mild reticulonodular opacity in the lingula favoring atypical or early infectious process. 3. Nonspecific 3.0 by 2.7 cm cystic lesion along the left side of the mass and adjacent to the left adnexa, but probably not arising from the ovary. 4. Mild levoconvex lumbar scoliosis. 5. Small sclerotic lesion posteriorly in the L4 vertebral body is probably benign. Electronically Signed   By: Van Clines M.D.   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 Patient has been treated for a jejunal AVMs. Hemoclips were recently placed. CTA was performed in order to further evaluate the areas of bleeding. EXAM: CTA ABDOMEN AND PELVIS WITHOUT AND WITH CONTRAST TECHNIQUE: Multidetector CT imaging of the abdomen and pelvis was performed using the standard protocol during bolus administration of intravenous contrast. Multiplanar reconstructed images and MIPs were obtained and reviewed to evaluate the vascular anatomy. CONTRAST:  181mL OMNIPAQUE IOHEXOL 300 MG/ML  SOLN COMPARISON:  None. FINDINGS: VASCULAR Aorta: Normal caliber aorta without aneurysm, dissection, vasculitis or significant stenosis. Celiac: Patent without evidence of aneurysm, dissection, vasculitis or significant stenosis. SMA: Patent without evidence of aneurysm,  dissection, vasculitis or significant stenosis. Renals: Limited evaluation of the renal arteries due to motion artifact in this area. There appears to be flow in bilateral renal arteries but the detail is markedly limited. IMA: Patent without evidence of aneurysm, dissection, vasculitis or significant stenosis. Inflow: Patent without evidence of aneurysm, dissection, vasculitis or significant stenosis. Proximal Outflow: Proximal femoral arteries are patent bilaterally. Veins: Enlarged venous structures in the pelvis surrounding the soft tissue mass. The mesenteric veins in the abdomen are enlarged and there is narrowing of the mesenteric veins near the proximal aspect of the SMV on sequence 10, image 84. Extensive vascular shunting from the pelvic mass with early filling of the mesenteric veins. No evidence for venous thrombosis. Splenic vein and portal veins are patent. Hepatic veins are patent. Review of the MIP images confirms the above findings. NON-VASCULAR Lower chest: Lung bases are clear.  No pleural effusions. Hepatobiliary: Normal appearance of the liver and gallbladder. No biliary dilatation. Pancreas: Unremarkable. No pancreatic ductal dilatation or surrounding inflammatory changes. Spleen: Normal in size without focal abnormality. Adrenals/Urinary Tract: Adrenal tissue is difficult to visualize but no evidence for an adrenal mass or lesion. Normal appearance of both kidneys without hydronephrosis. Negative for kidney stones. Small amount of fluid in the urinary bladder. Bladder is displaced by the large mass in the lower abdomen and pelvis. Stomach/Bowel: There appears to be metallic clip in the rectum which is probably from previous endoscopic procedures. Normal  appearance of the appendix. Stomach is distended with large amount of fluid. There is no evidence for focal bowel wall thickening. There are endoscopic clips within the jejunum in the mid lower abdomen on sequence 4, image 97. These clips are  adjacent to the large mass in the pelvis. Difficult to differentiate loops of small bowel from the mass in the pelvis. Lymphatic: No significant lymph node enlargement in the abdomen and pelvis Reproductive: 4.2 cm low-density structure in the region of the left adnexa. Uterus is slightly displaced towards the left side and adjacent to the large mass in the right central pelvis. Peripherally enhancing low-density structure in the right adnexal region measuring 1.9 cm is suggestive for a corpus luteum. Other: Avidly enhancing heterogeneous mass in the right central aspect of the pelvis and lower abdomen. This mass measures 10.0 x 8.0 x 9.0 cm. Structure is very vascular with early venous drainage during the arterial phase of imaging. Negative for ascites. There is no evidence for omental or peritoneal nodularity. Musculoskeletal: Small focus of sclerosis along the posterior aspect of the L4 vertebral body but favor benign etiology such as a bone island. No suspicious bone abnormalities. IMPRESSION: 1. Hypervascular mass in the right central pelvis, measuring up to 10.0 cm. There is early venous shunting associated with this soft tissue mass and there is venous engorgement of the mesenteric veins. The etiology for the soft tissue mass is uncertain but a GIST would be in the differential diagnosis. Soft tissue mass is difficult to separate from the uterus and adnexal structures and a primary gynecologic malignancy cannot be completely excluded. No evidence for lymphadenopathy or peritoneal nodularity. Recommend surgical oncology consultation. 2. Distension of the stomach.  No evidence for a bowel obstruction. 3. 4.2 cm low-density structure in the left adnexa region could represent an ovarian cyst but nonspecific. These results were called by telephone at the time of interpretation on 02/14/2021 at 11:33 am to provider Suzy Kugel , who verbally acknowledged these results. Electronically Signed   By: Markus Daft M.D.    On: 02/14/2021 11:38    Medications:  Scheduled: . pantoprazole (PROTONIX) IV  40 mg Intravenous Q12H  . sodium chloride flush  3 mL Intravenous Q12H  . cyanocobalamin  1,000 mcg Oral Daily   Continuous: . dextrose 40 mL/hr at 02/15/21 0601    Assessment/Plan: 1) Pelvic mass. 2) Small bowel bleed from the pelvic mass.   I am appreciative of Drs. Orinda Kenner, and Toth's input for management of the patient.  From the GI standpoint the enteroscopy did not show any jejunal involvement.  From discussions with radiology she has a very vascular lesion and there was venous back flow to the area.  Currently she is not bleeding, but there is a high likelihood for this to occur.  Plans are under way for resection, which is anticipated to be early next week.  It is difficult to determine how many other structures the mass is potentially involving.  Plan: 1) Follow HGB and transfuse as necessary. 2) Intervention per Surgery. 3) Signing off as there is no definitive treatment that GI can offer, but call if you have any questions.  LOS: 3 days   Inessa Wardrop D 02/15/2021, 6:19 PM

## 2021-02-15 NOTE — Consult Note (Signed)
Gynecologic Oncology Consultation  Paige Robertson 45 y.o. female  CC:  Chief Complaint  Patient presents with  . 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 She presented to the ER on 02/11/2021 for evaluation after having a syncopal episode. She was previously admitted from 02/07/21 to 02/09/21 for a recurrent upper GI bleed from AVM. An EGD was performed on 02/07/21 by Dr. 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 She received a blood transfusion and IV iron during the admission and was discharged home on 02/09/21 with a stable hemoglobin level at 7.5.   On 02/11/21 while at a softball game, she had a syncopal episode that lasted several minutes. In the ER on 02/11/21, her hemoglobin was 7.6 but trended downward to 5.8 on 02/12/21. She received a blood transfusion and was taken for a small bowel enteroscopy with Dr. Benson Norway on 02/13/21. A non-bleeding jejunal ulcer was noted with clips placed. A CT angiogram of the abdomen and pelvis was performed noting a 10 cm hypervascular pelvic mass with early venous shunting associated along with venous engorgement of the mesenteric veins with differential diagnosis of possible GIST tumor.                Interval History: Paige Robertson is resting in bed with husband and sister at the bedtime in no acute distress. She has just returned from her CT entero. She states she is feeling well today. No pain reported. Has had decreased appetite but no nausea or emesis reported. Prior to admission, she states she was having regular bowel movements and no changes in bladder function. She states she has lost ten pounds unintentionally since December 2021. She states her menstrual cycles have been irregular since the fall of 2021 and she has not had a period since she was watching over her father in  the hospital earlier this year. States she is up to date with gynecologic exams and pap smears. She has had an abnormal pap in the past but states no intervention was needed. No vaginal discharge reported. Denies abdominal surgeries. Denies early satiety and abdominal distention. Ambulating without difficulty and denies lightheadedness. All questions answered with the information we have at this point. CT entero pending.   Review of Systems: Constitutional: Feels well. Reporting unintentional 10 lb weight loss since Dec 2021. No early satiety, fever, chills, weakness.  Cardiovascular: No chest pain, shortness of breath, or edema.  Pulmonary: No cough or wheeze.  Gastrointestinal: No nausea, vomiting, or diarrhea. No bright red blood per rectum or change in bowel movement.  Genitourinary: No frequency, urgency, or dysuria. No vaginal bleeding or discharge.  Musculoskeletal: No myalgia or joint pain. Neurologic: No weakness, numbness, or change in gait.  Psychology: No depression, anxiety, or insomnia.  Health Maintenance: Mammogram: 03/02/2020 Pap Smear: Up to date per pt GI studies in Epic from 07/2020  Current Meds: Current medications reviewed.  Allergy:  Allergies  Allergen Reactions  . Feraheme [Ferumoxytol] Anaphylaxis  . Sulfa Antibiotics Other (See Comments)    Childhood allergy, unknown reaction    Social Hx:   Social History   Socioeconomic History  . Marital status: Married    Spouse name: Not on file  . Number of children: Not on file  . Years of education: Not on file  . Highest education level: Not on file  Occupational History  . Not on file  Tobacco Use  . Smoking status: Never Smoker  . Smokeless tobacco: Never Used  Vaping Use  . Vaping Use: Never used  Substance and Sexual Activity  . Alcohol use: No  . Drug use: No  . Sexual activity: Yes    Comment: vasectomy  Other Topics Concern  . Not on file  Social History Narrative  . Not on file   Social  Determinants of Health   Financial Resource Strain: Not on file  Food Insecurity: Not on file  Transportation Needs: Not on file  Physical Activity: Not on file  Stress: Not on file  Social Connections: Not on file  Intimate Partner Violence: Not on file    Past Surgical Hx:  Past Surgical History:  Procedure Laterality Date  . COLONOSCOPY WITH ESOPHAGOGASTRODUODENOSCOPY (EGD)    . ENTEROSCOPY N/A 07/09/2020   Procedure: ENTEROSCOPY;  Surgeon: Lin Landsman, MD;  Location: [facility] ENDOSCOPY;  Service: Gastroenterology;  Laterality: N/A;  . ENTEROSCOPY N/A 02/07/2021   Procedure: ENTEROSCOPY;  Surgeon: Lin Landsman, MD;  Location: [facility] ENDOSCOPY;  Service: Gastroenterology;  Laterality: N/A;  . ENTEROSCOPY N/A 02/13/2021   Procedure: ENTEROSCOPY;  Surgeon: Carol Ada, MD;  Location: [facility] ENDOSCOPY;  Service: Endoscopy;  Laterality: N/A;  . ESOPHAGOGASTRODUODENOSCOPY (EGD) WITH PROPOFOL N/A 07/06/2020   Procedure: ESOPHAGOGASTRODUODENOSCOPY (EGD) WITH PROPOFOL;  Surgeon: Lucilla Lame, MD;  Location: [facility] ENDOSCOPY;  Service: Endoscopy;  Laterality: N/A;  . ESOPHAGOGASTRODUODENOSCOPY (EGD) WITH PROPOFOL N/A 11/19/2020   Procedure: ESOPHAGOGASTRODUODENOSCOPY (EGD) WITH PROPOFOL;  Surgeon: Toledo, Benay Pike, MD;  Location: [facility] ENDOSCOPY;  Service: Gastroenterology;  Laterality: N/A;  . GIVENS CAPSULE STUDY N/A 07/08/2020   Procedure: GIVENS CAPSULE STUDY;  Surgeon: Virgel Manifold, MD;  Location: [facility] ENDOSCOPY;  Service: Endoscopy;  Laterality: N/A;  Administer between 6-8 pm today  . HEMOSTASIS CLIP PLACEMENT  02/13/2021   Procedure: HEMOSTASIS CLIP PLACEMENT;  Surgeon: Carol Ada, MD;  Location: [place];  Service: Endoscopy;;  . NO PAST SURGERIES    . SCLEROTHERAPY  02/13/2021   Procedure: Clide Deutscher;  Surgeon: Carol Ada, MD;  Location: [facility] ENDOSCOPY;  Service: Endoscopy;;    Past Medical Hx:  Past Medical History:  Diagnosis Date  . Acid reflux   . Hematuria    . Hyperlipemia   . IDA (iron deficiency anemia)   . Melena   . Perimenopausal symptoms   . Vaginal discharge     Family Hx:  Family History  Problem Relation Age of Onset  . Diabetes Father   . Stroke Father   . Breast cancer Maternal Grandmother   . Breast cancer Paternal Aunt   . High Cholesterol Mother   . Colon cancer Neg Hx   . Ovarian cancer Neg Hx   . Heart disease Neg Hx     Vitals:  Blood pressure 103/61, pulse 69, temperature 98.6 F (37 C), temperature source Oral, resp. rate 16, height 5\' 2"  (1.575 m), weight 126 lb 1.7 oz (57.2 kg), last menstrual period 01/24/2021, SpO2 100 %.   CBC    Component Value Date/Time   WBC 8.8 02/15/2021 0140   RBC 3.18 (L) 02/15/2021 0140   HGB 9.9 (L) 02/15/2021 0140   HGB 13.1 10/01/2017 1115   HCT 29.5 (L) 02/15/2021 0140   HCT 39.8 10/01/2017 1115   PLT 241 02/15/2021 0140   PLT 307 10/01/2017 1115   MCV 92.8 02/15/2021 0140   MCV 93 10/01/2017 1115   MCH 31.1 02/15/2021  0140   MCHC 33.6 02/15/2021 0140   RDW 19.8 (H) 02/15/2021 0140   RDW 13.7 10/01/2017 1115   LYMPHSABS 2.1 02/15/2021 0140   LYMPHSABS 2.1 10/01/2017 1115   MONOABS 0.6 02/15/2021 0140   EOSABS 0.2 02/15/2021 0140   EOSABS 0.1 10/01/2017 1115   BASOSABS 0.0 02/15/2021 0140   BASOSABS 0.0 10/01/2017 1115   BMET    Component Value Date/Time   NA 138 02/15/2021 0140   NA 141 10/01/2017 1115   K 4.0 02/15/2021 0140   CL 105 02/15/2021 0140   CO2 27 02/15/2021 0140   GLUCOSE 103 (H) 02/15/2021 0140   BUN 7 02/15/2021 0140   BUN 10 10/01/2017 1115   CREATININE 0.68 02/15/2021 0140   CALCIUM 9.2 02/15/2021 0140   GFRNONAA >60 02/15/2021 0140   GFRAA >60 07/09/2020 0502   CT angiogram on 02/14/21:  1. Hypervascular mass in the right central pelvis, measuring up to 10.0 cm. There is early venous shunting associated with this soft tissue mass and there is venous engorgement of the mesenteric veins. The etiology for the soft tissue mass is uncertain  but a GIST would be in the differential diagnosis. Soft tissue mass is difficult to separate from the uterus and adnexal structures and a primary gynecologic malignancy cannot be completely excluded. No evidence for lymphadenopathy or peritoneal nodularity. Recommend surgical oncology consultation. 2. Distension of the stomach.  No evidence for a bowel obstruction. 3. 4.2 cm low-density structure in the left adnexa region could represent an ovarian cyst but nonspecific.  Physical Exam:  Alert, oriented, resting in bed in no acute distress Lungs clear. Heart regular rate and rhythm. Abdomen soft, active bowel sounds, mild firmness noted in the lower abdomen   Assessment/Plan: 45 year old female admitted for a syncopal episode with questionable recurrent gastrointestinal bleeding s/p EGD with laser and clip treatment for AVM in the duodenum on 02/07/21. On CT angio from 02/14/21, a hypervascular 10 cm pelvic mass was noted with possible GIST tumor as a differential diagnosis. Case reviewed with Dr.Katherine Berline Lopes, GYN Oncologist, with her assessment below. Based on recent CT imaging from CT angio on 02/14/21, she is favoring a non-GYN process. Given the patient's recent history with gastrointestinal bleeding and the vascular appearance of the pelvic mass and surrounding vessels, it is more likely related to the tumor arising from the mesentery. Pelvic/abdominal MRI will be helpful in delineation. Based on MRI results, if the mass looks to be a GIST tumor or from the gastrointestinal tract, involvement of general surgery would be most appropriate. If the mass appears to arise from a gynecologic origin, additional tumor markers (LDH, inhibin B) would be recommended with stabilization of GI bleeding a priority. Surgical intervention of the mass if gynecologic to be considered once patient hemodynamically stable occurring most likely in the outpatient setting. The cyst on the left ovary is felt to be a normal cyst  in a premenopausal female. No obvious concerns seen on CT within the gynecologic organs and no signs of metastatic disease. CEA is recommended with next lab draw. Dr. Delsa Sale to see patient later today. We will continue to follow and monitor for MRI results.       UPDATE: Decision was made by radiologist to change MRI to CT AP entero. Will await the results.   Dorothyann Gibbs, NP 02/15/2021, 12:04 PM

## 2021-02-15 NOTE — Progress Notes (Addendum)
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 Almira Bar GI bleeds and duodenum at that time between 3/10-3/10/2021-Rx 1U PRBC-discharge hemoglobin 7  Prior pathology 07/06/2020 on endoscopy = duodenitis without concerning findings Husband/significant other is volunteer EMS-tells me patient felt lightheaded after discharge 3/12-syncopized  Patient was getting iron infusions under care of Dr. Tasia Catchings oncology until January and then stopped-patient apparently has also had a capsule study in the past and her index endoscopy a year ago by Dr. Alice Reichert revealed 8 areas that were cauterized  Represent [facility] ED tachycardia, lightheaded, diaphoretic-syncopized-confused post ictal appearing for several minutes Hemoglobin 5.8 Pregnancy testing negative  Transfused 2 units PRBC Dr. Benson Norway GI evaluated   Hospital-Problem based course  Recurrent GI bleed?  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 on CT angiogram 3/17 4.2 cm low-density left adnexal mass General surgery/gyn-onc and GI following await CEA CA-125 AFP from 3/18 Will type and screen Daily cbc-transfusion trigger less than hb 7 BMI 23 Severe iron deficiency anemia previously TSAT 12 iron 36-hold IV iron at this time Situational anxiety  Patient taught simple breathing techniques-box and  triangle breaths--re-inforced/teach back with sister--PRN  xanax as available [doesn't work well]    DVT prophylaxis: SCD Code Status: Full Family Communication: Long  discussion at bedside with sister Disposition:  Status is: Inpatient  Remains inpatient appropriate because:Hemodynamically unstable, Persistent severe electrolyte disturbances and IV treatments appropriate due to intensity of illness or inability to take PO   Dispo: The patient is from: Home              Anticipated d/c is to: Home              Patient currently is not medically stable to d/c.   Difficult to place patient No  Consultants:   Dr. Benson Norway gastroenterology  Procedures: Enteroscopy scheduled for 3/16  Antimicrobials: None currently   Subjective:  Slight watery darkish stool np cp fever eating no chills  Objective: Vitals:   02/14/21 1658 02/15/21 0109 02/15/21 0608 02/15/21 1143  BP: 108/71 92/61 98/64  103/61  Pulse: 88 70 72 69  Resp: 18 18 18 16   Temp: 98.3 F (36.8 C) 98.1 F (36.7 C) 98.4 F (36.9 C) 98.6 F (37 C)  TempSrc: Oral Oral Oral Oral  SpO2: 99% 100% 99% 100%  Weight:      Height:        Intake/Output Summary (Last 24 hours) at 02/15/2021 1812 Last data filed at 02/15/2021 0300 Gross per 24 hour  Intake 828.04 ml  Output -  Net 828.04 ml   Filed Weights   02/11/21 1925 02/13/21 1211  Weight: 57.2 kg 57.2 kg    Examination:  No distress CTA B no added sound rales rhonchi s1 s2 no m Abdomen soft no rebound guarding bowel sounds heard    Data Reviewed: personally reviewed   Data  BUNs/creatinine 9/0.7-->7/0.6 Hemoglobin 9.9 WBC 8  CBC    Component Value Date/Time   WBC 8.8 02/15/2021 0140   RBC 3.18 (L) 02/15/2021 0140   HGB 9.9 (L) 02/15/2021 0140  HGB 13.1 10/01/2017 1115   HCT 29.5 (L) 02/15/2021 0140   HCT 39.8 10/01/2017 1115   PLT 241 02/15/2021 0140   PLT 307 10/01/2017 1115   MCV 92.8 02/15/2021 0140   MCV 93 10/01/2017 1115   MCH 31.1 02/15/2021 0140   MCHC 33.6 02/15/2021 0140   RDW 19.8 (H) 02/15/2021 0140   RDW 13.7 10/01/2017 1115   LYMPHSABS 2.1 02/15/2021 0140   LYMPHSABS 2.1 10/01/2017 1115    MONOABS 0.6 02/15/2021 0140   EOSABS 0.2 02/15/2021 0140   EOSABS 0.1 10/01/2017 1115   BASOSABS 0.0 02/15/2021 0140   BASOSABS 0.0 10/01/2017 1115   CMP Latest Ref Rng & Units 02/15/2021 02/14/2021 02/13/2021  Glucose 70 - 99 mg/dL 103(H) 97 97  BUN 6 - 20 mg/dL 7 9 8   Creatinine 0.44 - 1.00 mg/dL 0.68 0.71 0.57  Sodium 135 - 145 mmol/L 138 137 137  Potassium 3.5 - 5.1 mmol/L 4.0 3.9 3.7  Chloride 98 - 111 mmol/L 105 104 107  CO2 22 - 32 mmol/L 27 26 25   Calcium 8.9 - 10.3 mg/dL 9.2 9.0 8.9  Total Protein 6.5 - 8.1 g/dL 5.3(L) 5.3(L) 5.2(L)  Total Bilirubin 0.3 - 1.2 mg/dL 0.5 0.6 0.7  Alkaline Phos 38 - 126 U/L 64 59 57  AST 15 - 41 U/L 18 16 17   ALT 0 - 44 U/L 27 31 35     Radiology Studies: CT ENTERO ABD/PELVIS W CONTAST  Result Date: 02/15/2021 CLINICAL DATA:  Anemia, GI bleeding with prior enteroscopy and argon plasma coagulator and clipping 02/07/2021 EXAM: CT ABDOMEN AND PELVIS WITH CONTRAST (ENTEROGRAPHY) TECHNIQUE: Multidetector CT of the abdomen and pelvis during bolus administration of intravenous contrast. Negative oral contrast was given. CONTRAST:  171mL OMNIPAQUE IOHEXOL 300 MG/ML  SOLN COMPARISON:  02/13/2021 FINDINGS: Lower chest: Mild reticulonodular opacity in the lingula favoring atypical or early infectious process. Hepatobiliary: Unremarkable.  The gallbladder appears normal. Pancreas: Unremarkable Spleen: Unremarkable Adrenals/Urinary Tract: Unremarkable Stomach/Bowel: In the right hemipelvis, a heterogeneous 9.2 by 7.5 by 8.6 cm enhancing mass is present with internal and surrounding vessels; there clips along bowel along the upper margin of this lesion for example on image 44 of series 8, and I am uncertain if the mass involves the wall of the jejunum along the clip. Although loops of ileum are also tangential to this mass, there is sharp angulation of a loop of jejunum which extends down into the pelvis into the margin of the mass as shown on image 43 of series 8,  suggesting that the jejunal wall may be tethered to this mass. While potentially a jejunal mass, carcinoid, or GI stromal tumor, this mass is also closely associated with the right adnexa and adjacent to the uterus. I am skeptical that this is a fibroid given its irregular lobular margin but ovarian tumor invading the small bowel is not completely excluded. Tributaries from the SMV extend around this mass. No dilated bowel is identified. Otherwise jejunal fold pattern appears normal and there is no additional significant abnormal accentuated mucosal enhancement. Vascular/Lymphatic: No atherosclerosis. No significant pathologic adenopathy. Reproductive: As noted above, the irregular lobulated mass abuts the right uterine fundus and right adnexa. Nonspecific 3.0 by 2.7 cm cystic lesion along the left side of the mass and adjacent to the left adnexa, but probably not arising from the ovary. Other: No supplemental non-categorized findings. Musculoskeletal: Mild levoconvex lumbar scoliosis. Small sclerotic lesion posteriorly in the L4 vertebral body is probably benign. IMPRESSION: 1. 9.2  by 7.5 by 8.6 cm hypervascular enhancing mass in the right hemipelvis, closely associated with the right uterine fundus and right adnexa. An angulated loop of jejunum extends down to the pelvis to the margin of this mass, and in sharply angles back up into the upper, and accordingly may be tethered to this mass. The hemoclips in the jejunum are immediately along the angulated inferior margin of this loop of jejunum, at the upper edge of the mass. Possibilities may include carcinoid, GI stromal tumor, other jejunal tumors, or a mass arising from the adjacent uterus/right ovary (although the tumor is not highly characteristic of a fibroid or typical ovarian neoplasm). Tissue diagnosis is recommended. 2. Mild reticulonodular opacity in the lingula favoring atypical or early infectious process. 3. Nonspecific 3.0 by 2.7 cm cystic lesion  along the left side of the mass and adjacent to the left adnexa, but probably not arising from the ovary. 4. Mild levoconvex lumbar scoliosis. 5. Small sclerotic lesion posteriorly in the L4 vertebral body is probably benign. Electronically Signed   By: Van Clines M.D.   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 Patient has been treated for a jejunal AVMs. Hemoclips were recently placed. CTA was performed in order to further evaluate the areas of bleeding. EXAM: CTA ABDOMEN AND PELVIS WITHOUT AND WITH CONTRAST TECHNIQUE: Multidetector CT imaging of the abdomen and pelvis was performed using the standard protocol during bolus administration of intravenous contrast. Multiplanar reconstructed images and MIPs were obtained and reviewed to evaluate the vascular anatomy. CONTRAST:  169mL OMNIPAQUE IOHEXOL 300 MG/ML  SOLN COMPARISON:  None. FINDINGS: VASCULAR Aorta: Normal caliber aorta without aneurysm, dissection, vasculitis or significant stenosis. Celiac: Patent without evidence of aneurysm, dissection, vasculitis or significant stenosis. SMA: Patent without evidence of aneurysm, dissection, vasculitis or significant stenosis. Renals: Limited evaluation of the renal arteries due to motion artifact in this area. There appears to be flow in bilateral renal arteries but the detail is markedly limited. IMA: Patent without evidence of aneurysm, dissection, vasculitis or significant stenosis. Inflow: Patent without evidence of aneurysm, dissection, vasculitis or significant stenosis. Proximal Outflow: Proximal femoral arteries are patent bilaterally. Veins: Enlarged venous structures in the pelvis surrounding the soft tissue mass. The mesenteric veins in the abdomen are enlarged and there is narrowing of the mesenteric veins near the proximal aspect of the SMV on sequence 10, image 84.  Extensive vascular shunting from the pelvic mass with early filling of the mesenteric veins. No evidence for venous thrombosis. Splenic vein and portal veins are patent. Hepatic veins are patent. Review of the MIP images confirms the above findings. NON-VASCULAR Lower chest: Lung bases are clear.  No pleural effusions. Hepatobiliary: Normal appearance of the liver and gallbladder. No biliary dilatation. Pancreas: Unremarkable. No pancreatic ductal dilatation or surrounding inflammatory changes. Spleen: Normal in size without focal abnormality. Adrenals/Urinary Tract: Adrenal tissue is difficult to visualize but no evidence for an adrenal mass or lesion. Normal appearance of both kidneys without hydronephrosis. Negative for kidney stones. Small amount of fluid in the urinary bladder. Bladder is displaced by the large mass in the lower abdomen and pelvis. Stomach/Bowel: There appears to be metallic clip in the rectum which is probably from previous endoscopic procedures. Normal appearance of the appendix. Stomach is distended with large amount of fluid. There is no evidence for focal bowel wall thickening. There are  endoscopic clips within the jejunum in the mid lower abdomen on sequence 4, image 97. These clips are adjacent to the large mass in the pelvis. Difficult to differentiate loops of small bowel from the mass in the pelvis. Lymphatic: No significant lymph node enlargement in the abdomen and pelvis Reproductive: 4.2 cm low-density structure in the region of the left adnexa. Uterus is slightly displaced towards the left side and adjacent to the large mass in the right central pelvis. Peripherally enhancing low-density structure in the right adnexal region measuring 1.9 cm is suggestive for a corpus luteum. Other: Avidly enhancing heterogeneous mass in the right central aspect of the pelvis and lower abdomen. This mass measures 10.0 x 8.0 x 9.0 cm. Structure is very vascular with early venous drainage during the  arterial phase of imaging. Negative for ascites. There is no evidence for omental or peritoneal nodularity. Musculoskeletal: Small focus of sclerosis along the posterior aspect of the L4 vertebral body but favor benign etiology such as a bone island. No suspicious bone abnormalities. IMPRESSION: 1. Hypervascular mass in the right central pelvis, measuring up to 10.0 cm. There is early venous shunting associated with this soft tissue mass and there is venous engorgement of the mesenteric veins. The etiology for the soft tissue mass is uncertain but a GIST would be in the differential diagnosis. Soft tissue mass is difficult to separate from the uterus and adnexal structures and a primary gynecologic malignancy cannot be completely excluded. No evidence for lymphadenopathy or peritoneal nodularity. Recommend surgical oncology consultation. 2. Distension of the stomach.  No evidence for a bowel obstruction. 3. 4.2 cm low-density structure in the left adnexa region could represent an ovarian cyst but nonspecific. These results were called by telephone at the time of interpretation on 02/14/2021 at 11:33 am to provider PATRICK HUNG , who verbally acknowledged these results. Electronically Signed   By: Markus Daft M.D.   On: 02/14/2021 11:38     Scheduled Meds: . pantoprazole (PROTONIX) IV  40 mg Intravenous Q12H  . sodium chloride flush  3 mL Intravenous Q12H  . cyanocobalamin  1,000 mcg Oral Daily   Continuous Infusions: . dextrose 40 mL/hr at 02/15/21 0601     LOS: 3 days   Time spent: [place], MD [facility] To contact the attending provider between 7A-7P or the covering provider during after hours 7P-7A, please log into the web site [email or web address] and access using universal [place] password for that web site. If you do not have the password, please call the hospital operator.  02/15/2021, 6:12 PM

## 2021-02-15 NOTE — Progress Notes (Signed)
2 Days Post-Op    CC: GI bleed/syncope  Subjective: No change this AM.  Awaiting further testing.  Objective: Vital signs in last 24 hours: Temp:  [97.6 F (36.4 C)-98.4 F (36.9 C)] 98.4 F (36.9 C) (03/18 7078) Pulse Rate:  [70-88] 72 (03/18 0608) Resp:  [16-18] 18 (03/18 0608) BP: (92-108)/(61-71) 98/64 (03/18 0608) SpO2:  [99 %-100 %] 99 % (03/18 0608) Last BM Date: 02/12/21 480 PO 2445 IV Urine x 4 Afebrile, VSS labs stable  Intake/Output from previous day: 03/17 0701 - 03/18 0700 In: 2925.5 [P.O.:480; I.V.:2445.5] Out: -  Intake/Output this shift: No intake/output data recorded.  General appearance: alert, cooperative and no distress Resp: clear to auscultation bilaterally GI: soft, non-tender; bowel sounds normal; no masses,  no organomegaly  Lab Results:  Recent Labs    02/14/21 0141 02/15/21 0140  WBC 8.0 8.8  HGB 10.1* 9.9*  HCT 30.5* 29.5*  PLT 253 241    BMET Recent Labs    02/14/21 0141 02/15/21 0140  NA 137 138  K 3.9 4.0  CL 104 105  CO2 26 27  GLUCOSE 97 103*  BUN 9 7  CREATININE 0.71 0.68  CALCIUM 9.0 9.2   PT/INR No results for input(s): LABPROT, INR in the last 72 hours.  Recent Labs  Lab 02/11/21 1958 02/13/21 0542 02/14/21 0141 02/15/21 0140  AST 39 17 16 18   ALT 61* 35 31 27  ALKPHOS 68 57 59 64  BILITOT 0.6 0.7 0.6 0.5  PROT 5.8* 5.2* 5.3* 5.3*  ALBUMIN 3.4* 3.0* 3.1* 3.2*     Lipase  No results found for: LIPASE   Medications: . pantoprazole (PROTONIX) IV  40 mg Intravenous Q12H  . sodium chloride flush  3 mL Intravenous Q12H  . 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      LOS: 3 days    Paige Robertson 02/15/2021 Please see Amion

## 2021-02-16 ENCOUNTER — Inpatient Hospital Stay (HOSPITAL_COMMUNITY): Payer: BC Managed Care – PPO

## 2021-02-16 LAB — CBC WITH DIFFERENTIAL/PLATELET
Abs Immature Granulocytes: 0.05 10*3/uL (ref 0.00–0.07)
Abs Immature Granulocytes: 0.03 10*3/uL (ref 0.00–0.07)
Basophils Absolute: 0 10*3/uL (ref 0.0–0.1)
Basophils Absolute: 0.1 10*3/uL (ref 0.0–0.1)
Basophils Relative: 0 %
Basophils Relative: 1 %
Eosinophils Absolute: 0.2 10*3/uL (ref 0.0–0.5)
Eosinophils Absolute: 0.1 10*3/uL (ref 0.0–0.5)
Eosinophils Relative: 2 %
Eosinophils Relative: 2 %
HCT: 30.3 % — ABNORMAL LOW (ref 36.0–46.0)
HCT: 27.2 % — ABNORMAL LOW (ref 36.0–46.0)
Hemoglobin: 9.9 g/dL — ABNORMAL LOW (ref 12.0–15.0)
Hemoglobin: 8.7 g/dL — ABNORMAL LOW (ref 12.0–15.0)
Immature Granulocytes: 1 %
Immature Granulocytes: 1 %
Lymphocytes Relative: 17 %
Lymphocytes Relative: 24 %
Lymphs Abs: 1.6 10*3/uL (ref 0.7–4.0)
Lymphs Abs: 1.6 10*3/uL (ref 0.7–4.0)
MCH: 30.5 pg (ref 26.0–34.0)
MCH: 30.3 pg (ref 26.0–34.0)
MCHC: 32.7 g/dL (ref 30.0–36.0)
MCHC: 32 g/dL (ref 30.0–36.0)
MCV: 93.2 fL (ref 80.0–100.0)
MCV: 94.8 fL (ref 80.0–100.0)
Monocytes Absolute: 0.8 10*3/uL (ref 0.1–1.0)
Monocytes Absolute: 0.5 10*3/uL (ref 0.1–1.0)
Monocytes Relative: 9 %
Monocytes Relative: 8 %
Neutro Abs: 6.8 10*3/uL (ref 1.7–7.7)
Neutro Abs: 4.3 10*3/uL (ref 1.7–7.7)
Neutrophils Relative %: 71 %
Neutrophils Relative %: 64 %
Platelets: 248 10*3/uL (ref 150–400)
Platelets: 248 10*3/uL (ref 150–400)
RBC: 3.25 MIL/uL — ABNORMAL LOW (ref 3.87–5.11)
RBC: 2.87 MIL/uL — ABNORMAL LOW (ref 3.87–5.11)
RDW: 19.5 % — ABNORMAL HIGH (ref 11.5–15.5)
RDW: 18.5 % — ABNORMAL HIGH (ref 11.5–15.5)
WBC: 9.4 10*3/uL (ref 4.0–10.5)
WBC: 6.6 10*3/uL (ref 4.0–10.5)
nRBC: 0 % (ref 0.0–0.2)
nRBC: 0 % (ref 0.0–0.2)

## 2021-02-16 LAB — COMPREHENSIVE METABOLIC PANEL
ALT: 25 U/L (ref 0–44)
ALT: 21 U/L (ref 0–44)
AST: 17 U/L (ref 15–41)
AST: 17 U/L (ref 15–41)
Albumin: 3.2 g/dL — ABNORMAL LOW (ref 3.5–5.0)
Albumin: 2.9 g/dL — ABNORMAL LOW (ref 3.5–5.0)
Alkaline Phosphatase: 72 U/L (ref 38–126)
Alkaline Phosphatase: 67 U/L (ref 38–126)
Anion gap: 4 — ABNORMAL LOW (ref 5–15)
Anion gap: 6 (ref 5–15)
BUN: 6 mg/dL (ref 6–20)
BUN: 9 mg/dL (ref 6–20)
CO2: 27 mmol/L (ref 22–32)
CO2: 23 mmol/L (ref 22–32)
Calcium: 8.9 mg/dL (ref 8.9–10.3)
Calcium: 8.4 mg/dL — ABNORMAL LOW (ref 8.9–10.3)
Chloride: 105 mmol/L (ref 98–111)
Chloride: 108 mmol/L (ref 98–111)
Creatinine, Ser: 0.65 mg/dL (ref 0.44–1.00)
Creatinine, Ser: 0.54 mg/dL (ref 0.44–1.00)
GFR, Estimated: >60 mL/min (ref >60)
GFR, Estimated: >60 mL/min (ref >60)
Glucose, Bld: 106 mg/dL — ABNORMAL HIGH (ref 70–99)
Glucose, Bld: 101 mg/dL — ABNORMAL HIGH (ref 70–99)
Potassium: 3.8 mmol/L (ref 3.5–5.1)
Potassium: 3.6 mmol/L (ref 3.5–5.1)
Sodium: 136 mmol/L (ref 135–145)
Sodium: 137 mmol/L (ref 135–145)
Total Bilirubin: 0.5 mg/dL (ref 0.3–1.2)
Total Bilirubin: 0.2 mg/dL — ABNORMAL LOW (ref 0.3–1.2)
Total Protein: 5.5 g/dL — ABNORMAL LOW (ref 6.5–8.1)
Total Protein: 5.1 g/dL — ABNORMAL LOW (ref 6.5–8.1)

## 2021-02-16 LAB — LACTIC ACID, PLASMA
Lactic Acid, Venous: 1.7 mmol/L (ref 0.5–1.9)
Lactic Acid, Venous: 1.1 mmol/L (ref 0.5–1.9)

## 2021-02-16 LAB — CA 125: Cancer Antigen (CA) 125: 20.8 U/mL (ref 0.0–38.1)

## 2021-02-16 LAB — GLUCOSE, CAPILLARY: Glucose-Capillary: 78 mg/dL (ref 70–99)

## 2021-02-16 LAB — AFP TUMOR MARKER: AFP, Serum, Tumor Marker: 2.5 ng/mL (ref 0.0–6.4)

## 2021-02-16 MED ORDER — SODIUM CHLORIDE 0.9 % IV SOLN: INTRAVENOUS | Status: DC

## 2021-02-16 MED ORDER — SODIUM CHLORIDE 0.9 % IV BOLUS
1000.0000 mL | Freq: Once | INTRAVENOUS | Status: AC
  Administered 2021-02-16: 1000 mL via INTRAVENOUS

## 2021-02-16 MED ORDER — MORPHINE SULFATE (PF) 2 MG/ML IV SOLN: 0.5000 mg | INTRAVENOUS | Status: DC | PRN

## 2021-02-16 NOTE — Progress Notes (Signed)
S: Had a severe pain episode localized to RLQ, with what sounds like a vasovagal episode. Pain has now subsided.  O: Vitals, labs, intake/output, and orders reviewed at this time. Afebrile, HR 78, BP 96/59   Gen: A&Ox3, no distress  H&N: EOMI, atraumatic, neck supple Chest: unlabored respirations, RRR Abd: soft, mildly tender RLQ and suprapubic, nondistended, no guarding or rebound Ext: warm, no edema Neuro: grossly normal  Lines/tubes/drains: PIV  A/P: 44yo woman with hx multifocal GIB and new finding of pelvic mass. Likely pain is from this mass. Do not think she has active bleeding given stable VS, benign abdominal exam. Agree with plans for repeat CBC, plain films, escalate care to progressive unit. I had a long talk with her and her husband about the mass and plans for surgery this week. Have ordered CEA and chromogranin A. I don't think an MRI is necessary as I do not think it will change the management; I think this needs to be resected to resolve her pain and reduce risk of recurrent bleeding. Will continue to follow- please call if clinical condition worsens.    Paige Juniper, MD [facility], [place]

## 2021-02-16 NOTE — Progress Notes (Signed)
Called emergently to bedside Patient had episode of paroxsymal abd pain-severe 10/10 well localized to RLQ--no bleed, + Nasuea, - vomit  Will tyope and screen-get stat CBC/CMEt/Lactic  BP 97/67 (BP Location: Left Arm)   Pulse 78   Temp 98.4 F (36.9 C) (Oral)   Resp 16   Ht 5\' 2"  (1.575 m)   Wt 57.2 kg   LMP 01/24/2021 (Approximate)   SpO2 100%   BMI 23.06 kg/m  Pale diaphor eomi ncat nsr on exam, not tachy abd soft, no rebound no guard--point tedner RLQ  A/P Will get P ABdxr r/o free air Bolus IVF 1000 CC and then rate 100 cc/h Dr. Kae Heller alerted--is ok with Moprhine for pain ctrl--appreciate her seeing patient If hemoglobin    ? 7, Tx 2 u prbc NIght coverage for [facility] will follow--I will alert them  Verneita Griffes, MD [facility] 5:43 PM

## 2021-02-16 NOTE — Progress Notes (Signed)
Dr Samtani at bedside 

## 2021-02-16 NOTE — Progress Notes (Signed)
General Surgery Follow Up Note  Subjective:    Overnight Issues:   Objective:  Vital signs for last 24 hours: Temp:  [98.2 F (36.8 C)-98.8 F (37.1 C)] 98.2 F (36.8 C) (03/19 0523) Pulse Rate:  [70-95] 70 (03/19 0523) Resp:  [16-18] 18 (03/19 0523) BP: (92-106)/(55-65) 92/58 (03/19 0523) SpO2:  [99 %-100 %] 99 % (03/19 0523)  Hemodynamic parameters for last 24 hours:    Intake/Output from previous day: 03/18 0701 - 03/19 0700 In: 240 [P.O.:240] Out: -   Intake/Output this shift: No intake/output data recorded.  Vent settings for last 24 hours:    Physical Exam:  Gen: comfortable, no distress Neuro: non-focal exam Neck: supple CV: RRR Pulm: unlabored breathing GU: spont voids Extr: wwp, no edema   Results for orders placed or performed during the hospital encounter of 02/11/21 (from the past 24 hour(s))  Type and screen [place]     Status: None   Collection Time: 02/15/21  6:49 PM  Result Value Ref Range   ABO/RH(D) O POS    Antibody Screen NEG    Sample Expiration      02/18/2021,2359 Performed at [facility] [address], [place], [place] [address]   CBC with Differential/Platelet     Status: Abnormal   Collection Time: 02/16/21 12:12 AM  Result Value Ref Range   WBC 9.4 4.0 - 10.5 K/uL   RBC 3.25 (L) 3.87 - 5.11 MIL/uL   Hemoglobin 9.9 (L) 12.0 - 15.0 g/dL   HCT 30.3 (L) 36.0 - 46.0 %   MCV 93.2 80.0 - 100.0 fL   MCH 30.5 26.0 - 34.0 pg   MCHC 32.7 30.0 - 36.0 g/dL   RDW 19.5 (H) 11.5 - 15.5 %   Platelets 248 150 - 400 K/uL   nRBC 0.0 0.0 - 0.2 %   Neutrophils Relative % 71 %   Neutro Abs 6.8 1.7 - 7.7 K/uL   Lymphocytes Relative 17 %   Lymphs Abs 1.6 0.7 - 4.0 K/uL   Monocytes Relative 9 %   Monocytes Absolute 0.8 0.1 - 1.0 K/uL   Eosinophils Relative 2 %   Eosinophils Absolute 0.2 0.0 - 0.5 K/uL   Basophils Relative 0 %   Basophils Absolute 0.0 0.0 - 0.1 K/uL   Immature Granulocytes 1 %   Abs Immature  Granulocytes 0.05 0.00 - 0.07 K/uL  Comprehensive metabolic panel     Status: Abnormal   Collection Time: 02/16/21 12:12 AM  Result Value Ref Range   Sodium 136 135 - 145 mmol/L   Potassium 3.8 3.5 - 5.1 mmol/L   Chloride 105 98 - 111 mmol/L   CO2 27 22 - 32 mmol/L   Glucose, Bld 106 (H) 70 - 99 mg/dL   BUN 6 6 - 20 mg/dL   Creatinine, Ser 0.65 0.44 - 1.00 mg/dL   Calcium 8.9 8.9 - 10.3 mg/dL   Total Protein 5.5 (L) 6.5 - 8.1 g/dL   Albumin 3.2 (L) 3.5 - 5.0 g/dL   AST 17 15 - 41 U/L   ALT 25 0 - 44 U/L   Alkaline Phosphatase 72 38 - 126 U/L   Total Bilirubin 0.5 0.3 - 1.2 mg/dL   GFR, Estimated >60 >60 mL/min   Anion gap 4 (L) 5 - 15    Assessment & Plan:  Present on Admission: . Near syncope . GI bleeding . Acute blood loss anemia    LOS: 4 days  Additional comments:I reviewed the patient's new clinical lab test results.   and I reviewed the patients new imaging test results.    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 I have reviewed this patient's available data, including medical history, events of note, physical examination and test results as part of my evaluation.

## 2021-02-16 NOTE — Progress Notes (Signed)
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 Almira Bar GI bleeds and duodenum at that time between 3/10-3/10/2021-Rx 1U PRBC-discharge hemoglobin 7  Prior pathology 07/06/2020 on endoscopy = duodenitis without concerning findings Husband/significant other is volunteer EMS-tells me patient felt lightheaded after discharge 3/12-syncopized  Patient was getting iron infusions under care of Dr. Tasia Catchings oncology until January and then stopped-patient apparently has also had a capsule study in the past and her index endoscopy a year ago by Dr. Alice Reichert revealed 8 areas that were cauterized  Represent [facility] ED tachycardia, lightheaded, diaphoretic-syncopized-confused post ictal appearing for several minutes Hemoglobin 5.8 Pregnancy testing negative  Transfused 2 units PRBC Dr. Benson [place] GI evaluated   Hospital-Problem based course  Recurrent GI bleed?  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 on CT angiogram 3/17 4.2 cm low-density left adnexal mass General surgery/gyn-onc and GI following negative CEA CA-125 AFP from 3/18 transfusion trigger less than hb 7 BMI 23 Severe iron deficiency anemia previously TSAT 12 iron 36-hold IV iron at this time Situational anxiety  -PRN xanax as available [doesn't work well]  Chaplain consult placed for support    DVT prophylaxis: SCD Code Status: Full Family Communication: Long discussion at  bedside c family 3/19 Disposition:  Status is: Inpatient  Remains inpatient appropriate because:Hemodynamically unstable, Persistent severe electrolyte disturbances and IV treatments appropriate due to intensity of illness or inability to take PO   Dispo: The patient is from: Home              Anticipated d/c is to: Home              Patient currently is not medically stable to d/c.   Difficult to place patient No  Consultants:   Dr. Benson [place] gastroenterology  Procedures: Enteroscopy scheduled for 3/16  Antimicrobials: None currently   Subjective:  See my other note  Objective: Vitals:   02/16/21 1236 02/16/21 1437 02/16/21 1706 02/16/21 1730  BP: (!) 87/59 91/60 101/62 97/67  Pulse: 83 72 84 78  Resp: 16     Temp: 98.4 F (36.9 C)     TempSrc: Oral  Oral   SpO2: 99%  100% 100%  Weight:      Height:        Intake/Output Summary (Last 24 hours) at 02/16/2021 1745 Last data filed at 02/15/2021 2040 Gross per 24 hour  Intake 240 ml  Output --  Net 240 ml   Filed Weights   02/11/21 1925 02/13/21 1211  Weight: 57.2 kg 57.2 kg    Examination:  As per note    Data Reviewed: personally reviewed   Data  BUNs/creatinine 9/0.7-->7/0.6 Hemoglobin 9.9 WBC 8  CBC    Component Value Date/Time   WBC 9.4 02/16/2021 0012   RBC 3.25 (L) 02/16/2021 0012   HGB 9.9 (L) 02/16/2021 0012   HGB 13.1 10/01/2017 1115   HCT 30.3 (L) 02/16/2021 0012   HCT 39.8 10/01/2017 1115   PLT 248 02/16/2021 0012   PLT  307 10/01/2017 1115   MCV 93.2 02/16/2021 0012   MCV 93 10/01/2017 1115   MCH 30.5 02/16/2021 0012   MCHC 32.7 02/16/2021 0012   RDW 19.5 (H) 02/16/2021 0012   RDW 13.7 10/01/2017 1115   LYMPHSABS 1.6 02/16/2021 0012   LYMPHSABS 2.1 10/01/2017 1115   MONOABS 0.8 02/16/2021 0012   EOSABS 0.2 02/16/2021 0012   EOSABS 0.1 10/01/2017 1115   BASOSABS 0.0 02/16/2021 0012   BASOSABS 0.0 10/01/2017 1115   CMP Latest Ref Rng & Units 02/16/2021 02/15/2021 02/14/2021   Glucose 70 - 99 mg/dL 106(H) 103(H) 97  BUN 6 - 20 mg/dL 6 7 9   Creatinine 0.44 - 1.00 mg/dL 0.65 0.68 0.71  Sodium 135 - 145 mmol/L 136 138 137  Potassium 3.5 - 5.1 mmol/L 3.8 4.0 3.9  Chloride 98 - 111 mmol/L 105 105 104  CO2 22 - 32 mmol/L 27 27 26   Calcium 8.9 - 10.3 mg/dL 8.9 9.2 9.0  Total Protein 6.5 - 8.1 g/dL 5.5(L) 5.3(L) 5.3(L)  Total Bilirubin 0.3 - 1.2 mg/dL 0.5 0.5 0.6  Alkaline Phos 38 - 126 U/L 72 64 59  AST 15 - 41 U/L 17 18 16   ALT 0 - 44 U/L 25 27 31      Radiology Studies: CT ENTERO ABD/PELVIS W CONTAST  Result Date: 02/15/2021 CLINICAL DATA:  Anemia, GI bleeding with prior enteroscopy and argon plasma coagulator and clipping 02/07/2021 EXAM: CT ABDOMEN AND PELVIS WITH CONTRAST (ENTEROGRAPHY) TECHNIQUE: Multidetector CT of the abdomen and pelvis during bolus administration of intravenous contrast. Negative oral contrast was given. CONTRAST:  169mL OMNIPAQUE IOHEXOL 300 MG/ML  SOLN COMPARISON:  02/13/2021 FINDINGS: Lower chest: Mild reticulonodular opacity in the lingula favoring atypical or early infectious process. Hepatobiliary: Unremarkable.  The gallbladder appears normal. Pancreas: Unremarkable Spleen: Unremarkable Adrenals/Urinary Tract: Unremarkable Stomach/Bowel: In the right hemipelvis, a heterogeneous 9.2 by 7.5 by 8.6 cm enhancing mass is present with internal and surrounding vessels; there clips along bowel along the upper margin of this lesion for example on image 44 of series 8, and I am uncertain if the mass involves the wall of the jejunum along the clip. Although loops of ileum are also tangential to this mass, there is sharp angulation of a loop of jejunum which extends down into the pelvis into the margin of the mass as shown on image 43 of series 8, suggesting that the jejunal wall may be tethered to this mass. While potentially a jejunal mass, carcinoid, or GI stromal tumor, this mass is also closely associated with the right adnexa and adjacent to the  uterus. I am skeptical that this is a fibroid given its irregular lobular margin but ovarian tumor invading the small bowel is not completely excluded. Tributaries from the SMV extend around this mass. No dilated bowel is identified. Otherwise jejunal fold pattern appears normal and there is no additional significant abnormal accentuated mucosal enhancement. Vascular/Lymphatic: No atherosclerosis. No significant pathologic adenopathy. Reproductive: As noted above, the irregular lobulated mass abuts the right uterine fundus and right adnexa. Nonspecific 3.0 by 2.7 cm cystic lesion along the left side of the mass and adjacent to the left adnexa, but probably not arising from the ovary. Other: No supplemental non-categorized findings. Musculoskeletal: Mild levoconvex lumbar scoliosis. Small sclerotic lesion posteriorly in the L4 vertebral body is probably benign. IMPRESSION: 1. 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 An angulated loop of  jejunum extends down to the pelvis to the margin of this mass, and in sharply angles back up into the upper, and accordingly may be tethered to this mass. The hemoclips in the jejunum are immediately along the angulated inferior margin of this loop of jejunum, at the upper edge of the mass. Possibilities may include carcinoid, GI stromal tumor, other jejunal tumors, or a mass arising from the adjacent uterus/right ovary (although the tumor is not highly characteristic of a fibroid or typical ovarian neoplasm). Tissue diagnosis is recommended. 2. Mild reticulonodular opacity in the lingula favoring atypical or early infectious process. 3. Nonspecific 3.0 by 2.7 cm cystic lesion along the left side of the mass and adjacent to the left adnexa, but probably not arising from the ovary. 4. Mild levoconvex lumbar scoliosis. 5. Small sclerotic lesion posteriorly in the L4 vertebral body is  probably benign. Electronically Signed   By: Van Clines M.D.   On: 02/15/2021 16:54     Scheduled Meds: . pantoprazole (PROTONIX) IV  40 mg Intravenous Q12H  . sodium chloride flush  3 mL Intravenous Q12H  . cyanocobalamin  1,000 mcg Oral Daily   Continuous Infusions:    LOS: 4 days   Time spent: [place], MD [facility] To contact the attending provider between 7A-7P or the covering provider during after hours 7P-7A, please log into the web site [email or web address] and access using universal [place] password for that web site. If you do not have the password, please call the hospital operator.  02/16/2021, 5:45 PM

## 2021-02-16 NOTE — Progress Notes (Signed)
Pt transported to 5W on cardiac monitor. Pt GCS 15 skin w/d, pt reports 1/10 abdominal discomfort.

## 2021-02-16 NOTE — Progress Notes (Addendum)
Pt husband to nurse station to report pt "sweating and not feeling good". This rn arrived to room to find pt supine clammy states "I don't feel well", pt denies nausea, cool cloth placed on pt forehead, text page sent to dr Verlon Au. Pt then with c/o of abdominal "cramping" RUQ, pt became restless and wax/waned to fetal positions, zofran 4 mg ivp given, dr Verlon Au and patrick with rapid response nursing paged. NS bolus started, placed pt on dinemap and telemetry monitor for continuous monitoring. Pt with sudden relief of abd pain after zofran.

## 2021-02-17 ENCOUNTER — Inpatient Hospital Stay: Payer: Self-pay

## 2021-02-17 LAB — CBC
HCT: 22.3 % — ABNORMAL LOW (ref 36.0–46.0)
Hemoglobin: 7.2 g/dL — ABNORMAL LOW (ref 12.0–15.0)
MCH: 30.6 pg (ref 26.0–34.0)
MCHC: 32.3 g/dL (ref 30.0–36.0)
MCV: 94.9 fL (ref 80.0–100.0)
Platelets: 212 10*3/uL (ref 150–400)
RBC: 2.35 MIL/uL — ABNORMAL LOW (ref 3.87–5.11)
RDW: 18.7 % — ABNORMAL HIGH (ref 11.5–15.5)
WBC: 6.7 10*3/uL (ref 4.0–10.5)
nRBC: 0 % (ref 0.0–0.2)

## 2021-02-17 LAB — BASIC METABOLIC PANEL
Anion gap: 4 — ABNORMAL LOW (ref 5–15)
BUN: 14 mg/dL (ref 6–20)
CO2: 24 mmol/L (ref 22–32)
Calcium: 7.9 mg/dL — ABNORMAL LOW (ref 8.9–10.3)
Chloride: 108 mmol/L (ref 98–111)
Creatinine, Ser: 0.47 mg/dL (ref 0.44–1.00)
GFR, Estimated: >60 mL/min (ref >60)
Glucose, Bld: 97 mg/dL (ref 70–99)
Potassium: 3.8 mmol/L (ref 3.5–5.1)
Sodium: 136 mmol/L (ref 135–145)

## 2021-02-17 LAB — ZINC: Zinc: 88 ug/dL (ref 44–115)

## 2021-02-17 LAB — PREPARE RBC (CROSSMATCH)

## 2021-02-17 LAB — MAGNESIUM: Magnesium: 2 mg/dL (ref 1.7–2.4)

## 2021-02-17 MED ORDER — CHLORHEXIDINE GLUCONATE CLOTH 2 % EX PADS
6.0000 | MEDICATED_PAD | Freq: Every day | CUTANEOUS | Status: DC
  Administered 2021-02-18 – 2021-02-22 (×5): 6 via TOPICAL

## 2021-02-17 MED ORDER — SODIUM CHLORIDE 0.9 % IV BOLUS: 250.0000 mL | Freq: Once | INTRAVENOUS | Status: DC

## 2021-02-17 MED ORDER — SODIUM CHLORIDE 0.9% IV SOLUTION: Freq: Once | INTRAVENOUS | Status: AC

## 2021-02-17 MED ORDER — SODIUM CHLORIDE 0.9 % IV BOLUS
250.0000 mL | Freq: Once | INTRAVENOUS | Status: AC
  Administered 2021-02-17: 250 mL via INTRAVENOUS

## 2021-02-17 MED ORDER — SODIUM CHLORIDE 0.9% FLUSH
10.0000 mL | INTRAVENOUS | Status: DC | PRN
Start: 2021-02-17 — End: 2021-02-23

## 2021-02-17 MED ORDER — FUROSEMIDE 10 MG/ML IJ SOLN
20.0000 mg | Freq: Once | INTRAMUSCULAR | Status: AC
  Administered 2021-02-17: 20 mg via INTRAVENOUS
  Filled 2021-02-17: qty 2

## 2021-02-17 NOTE — Progress Notes (Signed)
4 Days Post-Op    CC: GI bleed/syncope  Subjective: No further severe episodes of pain, does note she is a little bit more bloated and has ongoing crampy discomfort in the lower abdomen  Objective: Vital signs in last 24 hours: Temp:  [97.7 F (36.5 C)-98.5 F (36.9 C)] 98 F (36.7 C) (03/20 0735) Pulse Rate:  [64-84] 82 (03/20 0900) Resp:  [16-18] 17 (03/20 0735) BP: (79-101)/(52-67) 81/52 (03/20 0735) SpO2:  [99 %-100 %] 100 % (03/20 0900) Last BM Date: 02/12/21   Intake/Output from previous day: 03/19 0701 - 03/20 0700 In: 1450 [I.V.:1200; IV Piggyback:250] Out: -  Intake/Output this shift: No intake/output data recorded.  General appearance: alert, cooperative and no distress Resp: clear to auscultation bilaterally GI: Soft, mildly distended, mildly tender in the lower abdomen without peritonitis  Lab Results:  Recent Labs    02/16/21 1742 02/17/21 0212  WBC 6.6 6.7  HGB 8.7* 7.2*  HCT 27.2* 22.3*  PLT 248 212    BMET Recent Labs    02/16/21 1742 02/17/21 0212  NA 137 136  K 3.6 3.8  CL 108 108  CO2 23 24  GLUCOSE 101* 97  BUN 9 14  CREATININE 0.54 0.47  CALCIUM 8.4* 7.9*   PT/INR No results for input(s): LABPROT, INR in the last 72 hours.  Recent Labs  Lab 02/13/21 0542 02/14/21 0141 02/15/21 0140 02/16/21 0012 02/16/21 1742  AST 17 16 18 17 17   ALT 35 31 27 25 21   ALKPHOS 57 59 64 72 67  BILITOT 0.7 0.6 0.5 0.5 0.2*  PROT 5.2* 5.3* 5.3* 5.5* 5.1*  ALBUMIN 3.0* 3.1* 3.2* 3.2* 2.9*     Lipase  No results found for: LIPASE   Medications: . sodium chloride   Intravenous Once  . pantoprazole (PROTONIX) IV  40 mg Intravenous Q12H  . sodium chloride flush  3 mL Intravenous Q12H  . cyanocobalamin  1,000 mcg Oral Daily    Assessment/Plan Syncope/hypotension Anemia GERD   Recurrent GI bleed/Hx angiodysplasia of the duodenum 10 cm right/ central pelvic mass  FEN: IV fluids/cld, n.p.o. after midnight ID: None: DVT:  SCDs   Plan: Hemoglobin has decreased overnight from 9.9 yesterday morning to 7.2 today.  No tachycardia, she is hypotensive although she has low blood pressure at baseline.  1 unit transfusion has been ordered by primary team, repeat hemoglobin this afternoon.  This may reflect recurrent GI bleeding or may in fact be secondary to this pelvic mass. CEA and chromogranin A ordered.  I do not think further imaging is necessary as I do not think it will change the management, will tentatively plan for exploratory laparotomy and resection of this mass tomorrow with Dr. Grandville Silos, ideally coordinating with Dr. Berline Lopes. Continue cardiac monitoring and transfuse as needed to keep hgb>7. If there is a significant clinical change in the interim please call us.     LOS: 5 days    Clovis Riley 02/17/2021 Please see Amion

## 2021-02-17 NOTE — Progress Notes (Signed)
Peripherally Inserted Central Catheter Placement  The IV Nurse has discussed with the patient and/or persons authorized to consent for the patient, the purpose of this procedure and the potential benefits and risks involved with this procedure.  The benefits include less needle sticks, lab draws from the catheter, and the patient may be discharged home with the catheter. Risks include, but not limited to, infection, bleeding, blood clot (thrombus formation), and puncture of an artery; nerve damage and irregular heartbeat and possibility to perform a PICC exchange if needed/ordered by physician.  Alternatives to this procedure were also discussed.  Bard Power PICC patient education guide, fact sheet on infection prevention and patient information card has been provided to patient /or left at bedside.    PICC Placement Documentation  PICC Double Lumen 02/17/21 Right Brachial 34 cm 0 cm (Active)  Indication for Insertion or Continuance of Line Limited venous access - need for IV therapy >5 days (PICC only) 02/17/21 1900  Exposed Catheter (cm) 0 cm 02/17/21 1900  Site Assessment Clean;Dry;Intact 02/17/21 1900  Lumen #1 Status Flushed;Blood return noted 02/17/21 1900  Lumen #2 Status Flushed;Blood return noted 02/17/21 1900  Dressing Type Transparent 02/17/21 1900  Dressing Status Clean;Dry;Intact 02/17/21 1900  Antimicrobial disc in place? Yes 02/17/21 1900  Dressing Intervention New dressing 02/17/21 1900  Dressing Change Due 02/24/21 02/17/21 1900       Christella Noa Albarece 02/17/2021, 7:01 PM

## 2021-02-17 NOTE — Plan of Care (Signed)

## 2021-02-17 NOTE — Progress Notes (Addendum)
BP low at 79/52, MAP 61, HR 70. NS fluids at 115mL/hour. Have notified MD. Will continue to monitor.    Rechecked BP and now 88/53. Pt has remained in bed. MD has responded. We will monitor for now.

## 2021-02-17 NOTE — Progress Notes (Addendum)
Low BP of 89/52, HR70 Pt A&Ox4. She states this is her baseline. They did bolus her 261mL in ED only to achieve 101/62. Currently receiving NS 182mL/ hour. Paged MD to know if would want to give her a bolus or change her parameters?  Received order for 250 mL bolus. Administered bolus.  Repeat BP 89/48. Notified MD

## 2021-02-17 NOTE — Progress Notes (Signed)
Subjective: She c/o mild RLQ pain  Objective: Vital signs in last 24 hours: Temp:  [97.7 F (36.5 C)-98.5 F (36.9 C)] 98 F (36.7 C) (03/20 0735) Pulse Rate:  [64-84] 82 (03/20 0900) Resp:  [16-18] 17 (03/20 0735) BP: (79-101)/(52-67) 81/52 (03/20 0735) SpO2:  [99 %-100 %] 100 % (03/20 0900)  Intake/Output from previous day: 03/19 0701 - 03/20 0700 In: 1450 [I.V.:1200] Out: -  Intake/Output this shift: No intake/output data recorded.  General appearance: alert Abdomen: soft, NT, ND Extremities: Homans sign is negative, no sign of DVT  Results for orders placed or performed during the hospital encounter of 02/11/21 (from the past 24 hour(s))  Glucose, capillary     Status: None   Collection Time: 02/16/21  5:07 PM  Result Value Ref Range   Glucose-Capillary 78 70 - 99 mg/dL  Comprehensive metabolic panel     Status: Abnormal   Collection Time: 02/16/21  5:42 PM  Result Value Ref Range   Sodium 137 135 - 145 mmol/L   Potassium 3.6 3.5 - 5.1 mmol/L   Chloride 108 98 - 111 mmol/L   CO2 23 22 - 32 mmol/L   Glucose, Bld 101 (H) 70 - 99 mg/dL   BUN 9 6 - 20 mg/dL   Creatinine, Ser 0.54 0.44 - 1.00 mg/dL   Calcium 8.4 (L) 8.9 - 10.3 mg/dL   Total Protein 5.1 (L) 6.5 - 8.1 g/dL   Albumin 2.9 (L) 3.5 - 5.0 g/dL   AST 17 15 - 41 U/L   ALT 21 0 - 44 U/L   Alkaline Phosphatase 67 38 - 126 U/L   Total Bilirubin 0.2 (L) 0.3 - 1.2 mg/dL   GFR, Estimated >60 >60 mL/min   Anion gap 6 5 - 15  CBC with Differential/Platelet     Status: Abnormal   Collection Time: 02/16/21  5:42 PM  Result Value Ref Range   WBC 6.6 4.0 - 10.5 K/uL   RBC 2.87 (L) 3.87 - 5.11 MIL/uL   Hemoglobin 8.7 (L) 12.0 - 15.0 g/dL   HCT 27.2 (L) 36.0 - 46.0 %   MCV 94.8 80.0 - 100.0 fL   MCH 30.3 26.0 - 34.0 pg   MCHC 32.0 30.0 - 36.0 g/dL   RDW 18.5 (H) 11.5 - 15.5 %   Platelets 248 150 - 400 K/uL   nRBC 0.0 0.0 - 0.2 %   Neutrophils Relative % 64 %   Neutro Abs 4.3 1.7 - 7.7 K/uL   Lymphocytes  Relative 24 %   Lymphs Abs 1.6 0.7 - 4.0 K/uL   Monocytes Relative 8 %   Monocytes Absolute 0.5 0.1 - 1.0 K/uL   Eosinophils Relative 2 %   Eosinophils Absolute 0.1 0.0 - 0.5 K/uL   Basophils Relative 1 %   Basophils Absolute 0.1 0.0 - 0.1 K/uL   Immature Granulocytes 1 %   Abs Immature Granulocytes 0.03 0.00 - 0.07 K/uL  Lactic acid, plasma     Status: None   Collection Time: 02/16/21  5:42 PM  Result Value Ref Range   Lactic Acid, Venous 1.7 0.5 - 1.9 mmol/L  Lactic acid, plasma     Status: None   Collection Time: 02/16/21 10:03 PM  Result Value Ref Range   Lactic Acid, Venous 1.1 0.5 - 1.9 mmol/L  CBC     Status: Abnormal   Collection Time: 02/17/21  2:12 AM  Result Value Ref Range   WBC 6.7 4.0 - 10.5 K/uL  RBC 2.35 (L) 3.87 - 5.11 MIL/uL   Hemoglobin 7.2 (L) 12.0 - 15.0 g/dL   HCT 22.3 (L) 36.0 - 46.0 %   MCV 94.9 80.0 - 100.0 fL   MCH 30.6 26.0 - 34.0 pg   MCHC 32.3 30.0 - 36.0 g/dL   RDW 18.7 (H) 11.5 - 15.5 %   Platelets 212 150 - 400 K/uL   nRBC 0.0 0.0 - 0.2 %  Basic metabolic panel     Status: Abnormal   Collection Time: 02/17/21  2:12 AM  Result Value Ref Range   Sodium 136 135 - 145 mmol/L   Potassium 3.8 3.5 - 5.1 mmol/L   Chloride 108 98 - 111 mmol/L   CO2 24 22 - 32 mmol/L   Glucose, Bld 97 70 - 99 mg/dL   BUN 14 6 - 20 mg/dL   Creatinine, Ser 0.47 0.44 - 1.00 mg/dL   Calcium 7.9 (L) 8.9 - 10.3 mg/dL   GFR, Estimated >60 >60 mL/min   Anion gap 4 (L) 5 - 15  Magnesium     Status: None   Collection Time: 02/17/21  2:12 AM  Result Value Ref Range   Magnesium 2.0 1.7 - 2.4 mg/dL  Prepare RBC (crossmatch)     Status: None   Collection Time: 02/17/21  7:47 AM  Result Value Ref Range   Order Confirmation      ORDER PROCESSED BY BLOOD BANK Performed at [facility], [address], [place], [place] [address]    CA 125: 20.8  Scheduled Meds: . sodium chloride   Intravenous Once  . pantoprazole (PROTONIX) IV  40 mg Intravenous Q12H  . sodium  chloride flush  3 mL Intravenous Q12H  . cyanocobalamin  1,000 mcg Oral Daily   Continuous Infusions: . sodium chloride 100 mL/hr at 02/16/21 2018   PRN Meds:acetaminophen **OR** acetaminophen, ALPRAZolam, morphine injection, ondansetron **OR** ondansetron (ZOFRAN) IV  Assessment/Plan: Newly diagnosed pelvic mass, GI process is favored Downward trending hgb, suspect bleeding into the mass.  Hemodynamically stable  >agree w/transfusion to keep hgb > 7 >collaborative exploratory/extirpating procedure planned for this week   LOS: 5 days   Antoine Poche ID: Paige Robertson, female   DOB: 1976-08-01, 45 y.o.   MRN: [id number]

## 2021-02-17 NOTE — Progress Notes (Signed)
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 Almira Bar GI bleeds and duodenum at that time between 3/10-3/10/2021-Rx 1U PRBC-discharge hemoglobin 7  Prior pathology 07/06/2020 on endoscopy = duodenitis without concerning findings Husband/significant other is volunteer EMS-tells me patient felt lightheaded after discharge 3/12-syncopized  Patient was getting iron infusions under care of Dr. Tasia Catchings oncology until January and then stopped-patient apparently has also had a capsule study in the past and her index endoscopy a year ago by Dr. Alice Reichert revealed 8 areas that were cauterized  [facility] tachycardia, lightheaded, diaphoretic-syncopized-confused post ictal appearing for several minutes Hemoglobin 5.8 Pregnancy testing negative  Transfused 2 units PRBC Dr. Benson Norway GI evaluated--IR was going to embolize the area recommended CT angiogram Angiogram showed very vascular lesion presumably from bowel?  Attached to top of bladder?  Invading genitourinary organs? General surgery consulted subsequently-GYN oncology consulted additionally Plan is for en bloc dissection expediently given pain and suffering from mass   Hospital-Problem based course  Recurrent GI bleed?  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  Secondary to mass pressing on bladder/uterus BMI 23 Severe iron deficiency anemia previously TSAT 12 iron 36-hold IV iron at this time Situational anxiety  -PRN xanax as available [doesn't work well]  Chaplain consult placed for support  DVT prophylaxis: SCD Code Status: Full Family Communication: Discussed with husband Aaron Edelman at bedside 3/20 Disposition:  Status is: Inpatient  Remains inpatient appropriate because:Hemodynamically unstable, Persistent severe electrolyte disturbances and IV treatments appropriate due to intensity of illness or inability to take PO   Dispo: The patient is from: Home              Anticipated d/c is to: Home              Patient currently is not medically stable to d/c.   Difficult to place patient No  Consultants:   Dr. Benson Norway gastroenterology  Procedures: Enteroscopy scheduled for 3/16  Antimicrobials: None currently   Subjective:  Doing better this morning no chest pain no fever no chills No pain No dark stool no tarry stool  Objective: Vitals:   02/17/21 1221 02/17/21 1315 02/17/21 1355 02/17/21 1623  BP: (!) 92/53 103/65 (!) 98/58 101/61  Pulse: 76 76 81 81  Resp: 18 18 18 17   Temp: 98.3 F (36.8 C) 97.9 F (36.6 C) 98.9 F (37.2 C) 98.1 F (36.7 C)  TempSrc: Oral Oral Oral Oral  SpO2: 100% 100% 100% 100%  Weight:      Height:        Intake/Output Summary (Last 24 hours) at 02/17/2021 1659 Last data filed at 02/17/2021 1500 Gross per 24 hour  Intake 2218.83 ml  Output --  Net 2218.83 ml   Filed Weights   02/11/21 1925 02/13/21 1211  Weight: 57.2 kg 57.2 kg    Examination:  Awake coherent no distress EOMI NCAT no focal deficit S1-S2 no murmur Abdomen soft no rebound    Data Reviewed: personally reviewed   Data  BUNs/creatinine 9/0.7-->7/0.6 14/0.4--> Hemoglobin 9.9-->7.2   WBC 6.7  CBC    Component Value Date/Time   WBC 6.7 02/17/2021 0212   RBC 2.35 (L) 02/17/2021 0212   HGB 7.2 (L) 02/17/2021 0212   HGB 13.1 10/01/2017 1115   HCT 22.3 (L) 02/17/2021 0212   HCT 39.8 10/01/2017 1115   PLT 212 02/17/2021 0212   PLT 307 10/01/2017 1115   MCV 94.9 02/17/2021 0212   MCV 93 10/01/2017 1115   MCH 30.6 02/17/2021 0212   MCHC 32.3 02/17/2021 0212   RDW 18.7 (H) 02/17/2021 0212   RDW 13.7 10/01/2017 1115   LYMPHSABS 1.6 02/16/2021 1742   LYMPHSABS 2.1 10/01/2017 1115   MONOABS 0.5 02/16/2021 1742   EOSABS 0.1 02/16/2021 1742   EOSABS 0.1 10/01/2017 1115   BASOSABS 0.1 02/16/2021 1742   BASOSABS 0.0 10/01/2017 1115   CMP Latest Ref Rng & Units 02/17/2021 02/16/2021 02/16/2021  Glucose 70 - 99 mg/dL 97 101(H) 106(H)  BUN 6 - 20 mg/dL 14 9 6   Creatinine 0.44 - 1.00 mg/dL 0.47 0.54 0.65  Sodium 135 - 145 mmol/L 136 137 136  Potassium 3.5 - 5.1 mmol/L 3.8 3.6 3.8  Chloride 98 - 111 mmol/L 108 108 105  CO2 22 - 32 mmol/L 24 23 27   Calcium 8.9 - 10.3 mg/dL 7.9(L) 8.4(L) 8.9  Total Protein 6.5 - 8.1 g/dL - 5.1(L) 5.5(L)  Total Bilirubin 0.3 - 1.2 mg/dL - 0.2(L) 0.5  Alkaline Phos 38 - 126 U/L - 67 72  AST 15 - 41 U/L - 17 17  ALT 0 - 44 U/L - 21 25     Radiology Studies: DG Abd 2 Views  Result Date: 02/16/2021 CLINICAL DATA:  Abdominal pain EXAM: ABDOMEN - 2 VIEW COMPARISON:  None. FINDINGS: Metallic clips are seen in the upper pelvis, seen within jejunal small bowel loops on prior CT, presumably endoscopic clips. Gas within mildly prominent transverse colon. No small bowel dilatation. No organomegaly or free air. No suspicious calcification. IMPRESSION: No evidence of bowel obstruction or free air. Clips seen in the upper abdomen, stage shown on prior CT to be within proximal jejunal small bowel loops. Electronically Signed   By: Rolm Baptise M.D.   On: 02/16/2021 19:10   [place] EKG SITE RITE  Result Date: 02/17/2021 If Site Rite image not attached,  placement could not be confirmed due to current cardiac rhythm.    Scheduled Meds: . pantoprazole (PROTONIX) IV  40 mg Intravenous Q12H  . sodium chloride flush  3 mL Intravenous Q12H  . cyanocobalamin  1,000 mcg Oral Daily   Continuous Infusions: . sodium chloride 100 mL/hr at 02/16/21 2018     LOS: 5 days   Time spent: [place], MD [facility] To contact the attending provider between 7A-7P or the covering provider during after hours 7P-7A, please log into the web site [email or web address] and access using universal [place] password for that web site. If you do not have the password, please call the hospital operator.  02/17/2021, 4:59 PM

## 2021-02-18 ENCOUNTER — Encounter (HOSPITAL_COMMUNITY): Payer: Self-pay | Admitting: Family Medicine

## 2021-02-18 ENCOUNTER — Inpatient Hospital Stay (HOSPITAL_COMMUNITY): Payer: BC Managed Care – PPO | Admitting: Certified Registered Nurse Anesthetist

## 2021-02-18 ENCOUNTER — Encounter (HOSPITAL_COMMUNITY)
Admission: EM | Disposition: A | Discharge status: Home / Self Care | Payer: Self-pay | Source: Home / Self Care | Attending: Family Medicine

## 2021-02-18 HISTORY — PX: BOWEL RESECTION: SHX1257

## 2021-02-18 HISTORY — PX: LAPAROTOMY: SHX154

## 2021-02-18 LAB — HEMOGLOBIN AND HEMATOCRIT, BLOOD
HCT: 28.3 % — ABNORMAL LOW (ref 36.0–46.0)
Hemoglobin: 9 g/dL — ABNORMAL LOW (ref 12.0–15.0)

## 2021-02-18 LAB — PREPARE RBC (CROSSMATCH)

## 2021-02-18 SURGERY — LAPAROTOMY, EXPLORATORY
Anesthesia: General | Site: Abdomen

## 2021-02-18 MED ORDER — DEXMEDETOMIDINE BOLUS VIA INFUSION: 0.7000 ug/kg | Freq: Once | INTRAVENOUS | Status: DC

## 2021-02-18 MED ORDER — ACETAMINOPHEN 10 MG/ML IV SOLN
1000.0000 mg | Freq: Four times a day (QID) | INTRAVENOUS | Status: AC
  Administered 2021-02-18 – 2021-02-19 (×4): 1000 mg via INTRAVENOUS
  Filled 2021-02-18 (×4): qty 100

## 2021-02-18 MED ORDER — SODIUM CHLORIDE 0.9 % IV SOLN
2.0000 g | Freq: Once | INTRAVENOUS | Status: AC
  Administered 2021-02-18: 2 g via INTRAVENOUS
  Filled 2021-02-18: qty 2

## 2021-02-18 MED ORDER — FENTANYL CITRATE (PF) 100 MCG/2ML IJ SOLN: 25.0000 ug | INTRAMUSCULAR | Status: DC | PRN

## 2021-02-18 MED ORDER — MIDAZOLAM HCL 2 MG/2ML IJ SOLN
INTRAMUSCULAR | Status: AC
  Filled 2021-02-18: qty 2

## 2021-02-18 MED ORDER — PROPOFOL 10 MG/ML IV BOLUS
INTRAVENOUS | Status: DC | PRN
  Administered 2021-02-18: 130 mg via INTRAVENOUS

## 2021-02-18 MED ORDER — PROPOFOL 10 MG/ML IV BOLUS
INTRAVENOUS | Status: AC
  Filled 2021-02-18: qty 20

## 2021-02-18 MED ORDER — CHLORHEXIDINE GLUCONATE 0.12 % MT SOLN
OROMUCOSAL | Status: AC
  Administered 2021-02-18: 15 mL via OROMUCOSAL
  Filled 2021-02-18: qty 15

## 2021-02-18 MED ORDER — CHLORHEXIDINE GLUCONATE 0.12 % MT SOLN: 15.0000 mL | Freq: Once | OROMUCOSAL | Status: AC

## 2021-02-18 MED ORDER — SODIUM CHLORIDE 0.9 % IV SOLN
INTRAVENOUS | Status: AC
  Filled 2021-02-18: qty 2

## 2021-02-18 MED ORDER — OXYCODONE HCL 5 MG PO TABS: 5.0000 mg | ORAL_TABLET | Freq: Once | ORAL | Status: DC | PRN

## 2021-02-18 MED ORDER — FENTANYL CITRATE (PF) 250 MCG/5ML IJ SOLN
INTRAMUSCULAR | Status: DC | PRN
  Administered 2021-02-18 (×5): 50 ug via INTRAVENOUS

## 2021-02-18 MED ORDER — PHENYLEPHRINE HCL (PRESSORS) 10 MG/ML IV SOLN
INTRAVENOUS | Status: DC | PRN
  Administered 2021-02-18: 80 ug via INTRAVENOUS

## 2021-02-18 MED ORDER — HYDROMORPHONE HCL 1 MG/ML IJ SOLN
INTRAMUSCULAR | Status: AC
  Administered 2021-02-18: 0.5 mg via INTRAVENOUS
  Filled 2021-02-18: qty 1

## 2021-02-18 MED ORDER — OXYCODONE HCL 5 MG/5ML PO SOLN: 5.0000 mg | Freq: Once | ORAL | Status: DC | PRN

## 2021-02-18 MED ORDER — ONDANSETRON HCL 4 MG/2ML IJ SOLN: 4.0000 mg | Freq: Once | INTRAMUSCULAR | Status: DC | PRN

## 2021-02-18 MED ORDER — LACTATED RINGERS IV SOLN: INTRAVENOUS | Status: DC

## 2021-02-18 MED ORDER — ACETAMINOPHEN 10 MG/ML IV SOLN
INTRAVENOUS | Status: DC | PRN
  Administered 2021-02-18: 1000 mg via INTRAVENOUS

## 2021-02-18 MED ORDER — MORPHINE SULFATE (PF) 2 MG/ML IV SOLN
1.0000 mg | INTRAVENOUS | Status: DC | PRN
  Administered 2021-02-18 – 2021-02-19 (×8): 2 mg via INTRAVENOUS
  Filled 2021-02-18 (×8): qty 1

## 2021-02-18 MED ORDER — DEXAMETHASONE SODIUM PHOSPHATE 10 MG/ML IJ SOLN
INTRAMUSCULAR | Status: DC | PRN
  Administered 2021-02-18: 10 mg via INTRAVENOUS

## 2021-02-18 MED ORDER — SODIUM CHLORIDE 0.9% IV SOLUTION: Freq: Once | INTRAVENOUS | Status: DC

## 2021-02-18 MED ORDER — DEXMEDETOMIDINE HCL IN NACL 80 MCG/20ML IV SOLN
INTRAVENOUS | Status: AC
  Filled 2021-02-18: qty 20

## 2021-02-18 MED ORDER — ROCURONIUM BROMIDE 10 MG/ML (PF) SYRINGE
PREFILLED_SYRINGE | INTRAVENOUS | Status: DC | PRN
  Administered 2021-02-18: 50 mg via INTRAVENOUS

## 2021-02-18 MED ORDER — SUGAMMADEX SODIUM 200 MG/2ML IV SOLN
INTRAVENOUS | Status: DC | PRN
  Administered 2021-02-18: 150 mg via INTRAVENOUS

## 2021-02-18 MED ORDER — ACETAMINOPHEN 10 MG/ML IV SOLN
INTRAVENOUS | Status: AC
  Filled 2021-02-18: qty 100

## 2021-02-18 MED ORDER — MIDAZOLAM HCL 2 MG/2ML IJ SOLN
INTRAMUSCULAR | Status: DC | PRN
  Administered 2021-02-18: 2 mg via INTRAVENOUS

## 2021-02-18 MED ORDER — SODIUM CHLORIDE 0.9 % IV SOLN: INTRAVENOUS | Status: DC

## 2021-02-18 MED ORDER — FENTANYL CITRATE (PF) 100 MCG/2ML IJ SOLN
INTRAMUSCULAR | Status: AC
  Administered 2021-02-18: 50 ug via INTRAVENOUS
  Filled 2021-02-18: qty 2

## 2021-02-18 MED ORDER — PHENYLEPHRINE HCL-NACL 10-0.9 MG/250ML-% IV SOLN
INTRAVENOUS | Status: DC | PRN
  Administered 2021-02-18: 25 ug/min via INTRAVENOUS

## 2021-02-18 MED ORDER — HYDROMORPHONE HCL 1 MG/ML IJ SOLN
0.5000 mg | INTRAMUSCULAR | Status: DC | PRN
  Administered 2021-02-18: 0.5 mg via INTRAVENOUS

## 2021-02-18 MED ORDER — ONDANSETRON HCL 4 MG/2ML IJ SOLN
INTRAMUSCULAR | Status: DC | PRN
  Administered 2021-02-18: 4 mg via INTRAVENOUS

## 2021-02-18 MED ORDER — FENTANYL CITRATE (PF) 100 MCG/2ML IJ SOLN
25.0000 ug | INTRAMUSCULAR | Status: DC | PRN
  Administered 2021-02-18: 50 ug via INTRAVENOUS

## 2021-02-18 MED ORDER — LIDOCAINE 2% (20 MG/ML) 5 ML SYRINGE
INTRAMUSCULAR | Status: DC | PRN
  Administered 2021-02-18: 40 mg via INTRAVENOUS

## 2021-02-18 MED ORDER — FENTANYL CITRATE (PF) 250 MCG/5ML IJ SOLN
INTRAMUSCULAR | Status: AC
  Filled 2021-02-18: qty 5

## 2021-02-18 SURGICAL SUPPLY — 48 items
APPLIER CLIP ROT 10 11.4 M/L (STAPLE) ×2
BLADE CLIPPER SURG (BLADE) IMPLANT
CANISTER SUCT 3000ML PPV (MISCELLANEOUS) ×2 IMPLANT
CHLORAPREP W/TINT 26 (MISCELLANEOUS) ×2 IMPLANT
CLIP APPLIE ROT 10 11.4 M/L (STAPLE) ×1 IMPLANT
COVER SURGICAL LIGHT HANDLE (MISCELLANEOUS) ×2 IMPLANT
COVER WAND RF STERILE (DRAPES) ×2 IMPLANT
DRAPE LAPAROSCOPIC ABDOMINAL (DRAPES) ×2 IMPLANT
DRAPE WARM FLUID 44X44 (DRAPES) ×2 IMPLANT
DRSG OPSITE POSTOP 4X10 (GAUZE/BANDAGES/DRESSINGS) ×2 IMPLANT
DRSG OPSITE POSTOP 4X8 (GAUZE/BANDAGES/DRESSINGS) IMPLANT
ELECT BLADE 6.5 EXT (BLADE) IMPLANT
ELECT CAUTERY BLADE 6.4 (BLADE) ×2 IMPLANT
ELECT REM PT RETURN 9FT ADLT (ELECTROSURGICAL) ×2
ELECTRODE REM PT RTRN 9FT ADLT (ELECTROSURGICAL) ×1 IMPLANT
GLOVE BIO SURGEON STRL SZ8 (GLOVE) ×2 IMPLANT
GLOVE SRG 8 PF TXTR STRL LF DI (GLOVE) ×1 IMPLANT
GLOVE SURG UNDER POLY LF SZ8 (GLOVE) ×1
GOWN STRL REUS W/ TWL LRG LVL3 (GOWN DISPOSABLE) ×1 IMPLANT
GOWN STRL REUS W/ TWL XL LVL3 (GOWN DISPOSABLE) ×1 IMPLANT
GOWN STRL REUS W/TWL LRG LVL3 (GOWN DISPOSABLE) ×1
GOWN STRL REUS W/TWL XL LVL3 (GOWN DISPOSABLE) ×1
HANDLE SUCTION POOLE (INSTRUMENTS) ×1 IMPLANT
KIT BASIN OR (CUSTOM PROCEDURE TRAY) ×2 IMPLANT
KIT TURNOVER KIT B (KITS) ×2 IMPLANT
LIGASURE IMPACT 36 18CM CVD LR (INSTRUMENTS) ×2 IMPLANT
MARKER SKIN DUAL TIP RULER LAB (MISCELLANEOUS) ×2 IMPLANT
NS IRRIG 1000ML POUR BTL (IV SOLUTION) ×4 IMPLANT
PACK GENERAL/GYN (CUSTOM PROCEDURE TRAY) ×2 IMPLANT
PAD ARMBOARD 7.5X6 YLW CONV (MISCELLANEOUS) ×2 IMPLANT
PENCIL SMOKE EVACUATOR (MISCELLANEOUS) ×2 IMPLANT
RELOAD PROXIMATE 75MM BLUE (ENDOMECHANICALS) ×4 IMPLANT
SPECIMEN JAR LARGE (MISCELLANEOUS) IMPLANT
SPONGE LAP 18X18 RF (DISPOSABLE) IMPLANT
STAPLER GUN LINEAR PROX 60 (STAPLE) ×2 IMPLANT
STAPLER PROXIMATE 75MM BLUE (STAPLE) ×2 IMPLANT
STAPLER VISISTAT 35W (STAPLE) ×2 IMPLANT
SUCTION POOLE HANDLE (INSTRUMENTS) ×2
SUT PDS AB 1 TP1 96 (SUTURE) ×4 IMPLANT
SUT SILK 2 0 SH CR/8 (SUTURE) ×4 IMPLANT
SUT SILK 2 0 TIES 10X30 (SUTURE) ×2 IMPLANT
SUT SILK 3 0 SH CR/8 (SUTURE) ×2 IMPLANT
SUT SILK 3 0 TIES 10X30 (SUTURE) ×2 IMPLANT
TOWEL GREEN STERILE (TOWEL DISPOSABLE) ×2 IMPLANT
TRAY FOLEY MTR SLVR 16FR STAT (SET/KITS/TRAYS/PACK) IMPLANT
TRAY FOLEY W/BAG SLVR 16FR (SET/KITS/TRAYS/PACK) ×1
TRAY FOLEY W/BAG SLVR 16FR ST (SET/KITS/TRAYS/PACK) ×1 IMPLANT
YANKAUER SUCT BULB TIP NO VENT (SUCTIONS) IMPLANT

## 2021-02-18 NOTE — Anesthesia Procedure Notes (Signed)
Procedure Name: Intubation Date/Time: 02/18/2021 9:42 AM Performed by: Clearnce Sorrel, CRNA Pre-anesthesia Checklist: Patient identified, Emergency Drugs available, Suction available, Patient being monitored and Timeout performed Patient Re-evaluated:Patient Re-evaluated prior to induction Oxygen Delivery Method: Circle system utilized Preoxygenation: Pre-oxygenation with 100% oxygen Induction Type: IV induction Ventilation: Mask ventilation without difficulty Laryngoscope Size: Mac and 3 Grade View: Grade I Tube type: Oral Tube size: 7.0 mm Number of attempts: 1 Airway Equipment and Method: Stylet Placement Confirmation: ETT inserted through vocal cords under direct vision,  positive ETCO2 and breath sounds checked- equal and bilateral Secured at: 22 cm Tube secured with: Tape Dental Injury: Teeth and Oropharynx as per pre-operative assessment

## 2021-02-18 NOTE — Anesthesia Postprocedure Evaluation (Signed)
Anesthesia Post Note  Patient: Paige Robertson  Procedure(s) Performed: RESECTION PELVIC MASS (N/A Abdomen) SMALL BOWEL RESECTION (N/A Abdomen)     Patient location during evaluation: PACU Anesthesia Type: General Level of consciousness: awake and alert Pain management: pain level controlled Vital Signs Assessment: post-procedure vital signs reviewed and stable Respiratory status: spontaneous breathing, nonlabored ventilation, respiratory function stable and patient connected to nasal cannula oxygen Cardiovascular status: blood pressure returned to baseline and stable Postop Assessment: no apparent nausea or vomiting Anesthetic complications: no   No complications documented.  Last Vitals:  Vitals:   02/18/21 1245 02/18/21 1312  BP: 91/63 103/62  Pulse: 70 70  Resp: 12 17  Temp: (!) 36.4 C 36.7 C  SpO2: 99% 99%    Last Pain:  Vitals:   02/18/21 1312  TempSrc: Oral  PainSc:                  [place] Welcome COKER

## 2021-02-18 NOTE — Progress Notes (Signed)
Went to visit patient in room--already down for procedure.  Grateful for guidance from surgery/Gyn-onc colleagues  Will sign off post operatively as per discussion with Dr. Lavone Neri this am--I am however here till 3/22 pm for medical concerns or questions  Thank you for collaborating to take care of this patient  No charge.  Verneita Griffes, MD [facility] 9:05 AM

## 2021-02-18 NOTE — Plan of Care (Signed)

## 2021-02-18 NOTE — Progress Notes (Signed)
3/21 Gyn Onc Progress Note  I was present at the start of surgery this morning. On exam, mobile mass appreciated in the right lower pelvis/cul de sac. Uterus small, no adnexal masses appreciated. Dr. Grandville Silos encountered a 10 cm tumor arising from the small bowel with minimal adhesions to the right pelvic sidewall. On palpation, gyn organs normal.  I called Mr. Nickle, Rickey's husband, to let him know findings from surgery and that Dr. Grandville Silos was proceeding with an enbloc resection of mass and small bowel segment.  We will be signing off but happy to help out if any future need arises.  Jeral Pinch MD Gynecologic Oncology

## 2021-02-18 NOTE — Transfer of Care (Signed)
Immediate Anesthesia Transfer of Care Note  Patient: Paige Robertson  Procedure(s) Performed: RESECTION PELVIC MASS (N/A Abdomen) SMALL BOWEL RESECTION (N/A Abdomen)  Patient Location: PACU  Anesthesia Type:General  Level of Consciousness: awake, alert  and oriented  Airway & Oxygen Therapy: Patient Spontanous Breathing  Post-op Assessment: Report given to RN and Post -op Vital signs reviewed and stable  Post vital signs: Reviewed and stable  Last Vitals:  Vitals Value Taken Time  BP 111/53 02/18/21 1115  Temp    Pulse 76 02/18/21 1115  Resp 15 02/18/21 1115  SpO2 99 % 02/18/21 1115    Last Pain:  Vitals:   02/18/21 0823  TempSrc: Oral  PainSc:          Complications: No complications documented.

## 2021-02-18 NOTE — Op Note (Signed)
02/18/2021  11:05 AM  PATIENT:  Paige Robertson  45 y.o. female  PRE-OPERATIVE DIAGNOSIS:  Pelvic mass  POST-OPERATIVE DIAGNOSIS:  Mass arising from the proximal jejunum  PROCEDURE:  Procedure(s): RESECTION PELVIC MASS SMALL BOWEL RESECTION  SURGEON:  Surgeon(s): Georganna Skeans, MD  ASSISTANTS: none  ANESTHESIA:   general  EBL:  Total I/O In: 600 [I.V.:600] Out: 125 [Urine:100; Blood:25]  BLOOD ADMINISTERED:none  DRAINS: none   SPECIMEN:  Excision  DISPOSITION OF SPECIMEN:  PATHOLOGY  COUNTS:  YES  DICTATION: .Dragon Dictation Findings: Large vascular mass arising from the antimesenteric border of the proximal jejunum, no significant abnormality noted of the uterus or adnexa.  The cystic structure that appeared along the adnexa was a portion of this mass.  Scattered areas of black speckling were seen involving the omentum and this mass.   Procedure in detail: Informed consent was obtained.  She received intravenous antibiotics.  She was brought to the operating room.  General endotracheal anesthesia was administered by the anesthesia staff.  Foley catheter was placed.  She was placed in lithotomy position.  Dr. Berline Lopes performed a bimanual examination and then prepped her perineum and vagina.  The remainder of her abdomen was prepped and draped appropriately.  We did a timeout procedure.  I made a lower midline incision.  Subcutaneous tissues were dissected down using cautery.  The fascia was divided along the midline and I entered the peritoneal cavity under direct vision without difficulty.  The fascia was opened to the length of the incision.  I palpated the pelvis and the mass was readily apparent.  It was quite large so I extended the incision a little bit to just above her umbilicus.  There were 2 pelvic adhesions to the sidewall from the mass.  These were taken down with cautery.  The mass was delivered into the wound and was found to be arising from the proximal  jejunum, antimesenteric border.  The uterus and adnexa bilaterally were palpated and no significant abnormalities were felt attention was redirected back down to the pelvic sidewall.  There were 2 small arteries which had been feeding this tumor.  They were both secured with suture ligation and clips.  This achieved excellent hemostasis.  The small bowel was run from the terminal ileum back to the ligament of Treitz.  I did not feel any small bowel mesenteric adenopathy and I did not find any other masses.  Attention was directed back to the mass and I performed a small bowel resection.  I protected the wound with callus.  The small bowel was divided proximally and distally to both this mass and an area where it appears GI made a tattoo.  I divided with GIA-75 stapler.  I then used the LigaSure to take down the mesentery.  I divided a very large vein which was present between clamps.  This was then ligated with 2-0 silk.  There was excellent hemostasis.  I then performed a side-to-side anastomosis of the small bowel using GIA 75 stapler.  The staple line inside was hemostatic.  The common defect was closed with a TA 60.  I then placed multiple 2-0 silk sutures and 3-0 silk sutures along the staple line to get hemostasis.  The defect in the mesentery was closed with interrupted 2 oh silks as well.  Bowel was returned to anatomic position.  I reinspected the pelvis and there was excellent hemostasis.  The abdomen was irrigated.  Fascia was closed with running #1 looped PDS and  the skin was closed with staples after irrigating the subcutaneous plane.  Sterile dressing was applied.  All counts were correct.  She tolerated the procedure well without apparent complication and was taken recovery in stable condition. PATIENT DISPOSITION:  PACU - hemodynamically stable.   Delay start of Pharmacological VTE agent (>24hrs) due to surgical blood loss or risk of bleeding:  yes  Georganna Skeans, MD, MPH, FACS Pager:  [phone]  3/21/202211:05 AM

## 2021-02-18 NOTE — Progress Notes (Signed)
5 Days Post-Op   Subjective: Up in chair, less RLQ pain.  ROS negative except as listed above. Objective: Vital signs in last 24 hours: Temp:  [97.7 F (36.5 C)-98.9 F (37.2 C)] 98 F (36.7 C) (03/21 0334) Pulse Rate:  [61-82] 64 (03/21 0334) Resp:  [17-18] 18 (03/21 0334) BP: (91-103)/(53-66) 93/66 (03/21 0334) SpO2:  [98 %-100 %] 98 % (03/21 0334) Last BM Date: 02/14/21  Intake/Output from previous day: 03/20 0701 - 03/21 0700 In: 1173.4 [P.O.:480; I.V.:257.2; Blood:436.3] Out: -  Intake/Output this shift: No intake/output data recorded.  General appearance: alert and cooperative Resp: clear to auscultation bilaterally Cardio: regular rate and rhythm GI: soft, fullness in pelvis but nT Extremities: extremities normal, atraumatic, no cyanosis or edema Neurologic: Mental status: Alert, oriented, thought content appropriate  Lab Results: CBC  Recent Labs    02/16/21 1742 02/17/21 0212 02/18/21 0353  WBC 6.6 6.7  --   HGB 8.7* 7.2* 9.0*  HCT 27.2* 22.3* 28.3*  PLT 248 212  --    BMET Recent Labs    02/16/21 1742 02/17/21 0212  NA 137 136  K 3.6 3.8  CL 108 108  CO2 23 24  GLUCOSE 101* 97  BUN 9 14  CREATININE 0.54 0.47  CALCIUM 8.4* 7.9*   PT/INR No results for input(s): LABPROT, INR in the last 72 hours. ABG No results for input(s): PHART, HCO3 in the last 72 hours.  Invalid input(s): PCO2, PO2  Studies/Results: DG Abd 2 Views  Result Date: 02/16/2021 CLINICAL DATA:  Abdominal pain EXAM: ABDOMEN - 2 VIEW COMPARISON:  None. FINDINGS: Metallic clips are seen in the upper pelvis, seen within jejunal small bowel loops on prior CT, presumably endoscopic clips. Gas within mildly prominent transverse colon. No small bowel dilatation. No organomegaly or free air. No suspicious calcification. IMPRESSION: No evidence of bowel obstruction or free air. Clips seen in the upper abdomen, stage shown on prior CT to be within proximal jejunal small bowel loops.  Electronically Signed   By: Rolm Baptise M.D.   On: 02/16/2021 19:10   [place] EKG SITE RITE  Result Date: 02/17/2021 If Site Rite image not attached, placement could not be confirmed due to current cardiac rhythm.   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 2u available for OR FEN: IV fluids/cld, n.p.o. after midnight ID: None: DVT: SCDs   Plan: to OR this am for resection of pelvic mass, possible bowel resection, possible ostomy. If it arises from the uterus or adnexa, Dr. Berline Lopes from [place] will perform any needed procedure for that. I discussed the procedure, risks, and benefits with her and her husband. She agrees. 2u PRBC available for OR.   LOS: 6 days    Georganna Skeans, MD, MPH, FACS Trauma & General Surgery Use [email or web address] to contact on call provider  3/21/2022Patient ID: Paige Robertson, female   DOB: 18-Aug-1976, 45 y.o.   MRN: [id number]

## 2021-02-18 NOTE — Anesthesia Preprocedure Evaluation (Addendum)
Anesthesia Evaluation  Patient identified by MRN, date of birth, ID band Patient awake    Reviewed: Allergy & Precautions, NPO status , Patient's Chart, lab work & pertinent test results  Airway Mallampati: II  TM Distance: >3 FB Neck ROM: Full    Dental  (+) Teeth Intact, Dental Advisory Given   Pulmonary    breath sounds clear to auscultation       Cardiovascular  Rhythm:Regular Rate:Normal     Neuro/Psych    GI/Hepatic   Endo/Other    Renal/GU      Musculoskeletal   Abdominal   Peds  Hematology   Anesthesia Other Findings   Reproductive/Obstetrics                             Anesthesia Physical Anesthesia Plan  ASA: III  Anesthesia Plan: General   Post-op Pain Management:    Induction: Intravenous  PONV Risk Score and Plan: Ondansetron, Dexamethasone and Propofol infusion  Airway Management Planned: Oral ETT  Additional Equipment:   Intra-op Plan:   Post-operative Plan: Extubation in OR  Informed Consent: I have reviewed the patients History and Physical, chart, labs and discussed the procedure including the risks, benefits and alternatives for the proposed anesthesia with the patient or authorized representative who has indicated his/her understanding and acceptance.     Dental advisory given  Plan Discussed with: CRNA and Anesthesiologist  Anesthesia Plan Comments: (H/H 9.0/28.3 two units PRBCs available)       Anesthesia Quick Evaluation

## 2021-02-19 ENCOUNTER — Encounter (HOSPITAL_COMMUNITY): Payer: Self-pay | Admitting: General Surgery

## 2021-02-19 ENCOUNTER — Telehealth: Payer: Self-pay | Admitting: Oncology

## 2021-02-19 LAB — TYPE AND SCREEN
ABO/RH(D): O POS
Antibody Screen: NEGATIVE
Unit division: 0
Unit division: 0
Unit division: 0
Unit division: 0

## 2021-02-19 LAB — COMPREHENSIVE METABOLIC PANEL
ALT: 27 U/L (ref 0–44)
AST: 23 U/L (ref 15–41)
Albumin: 2.6 g/dL — ABNORMAL LOW (ref 3.5–5.0)
Alkaline Phosphatase: 76 U/L (ref 38–126)
Anion gap: 4 — ABNORMAL LOW (ref 5–15)
BUN: 6 mg/dL (ref 6–20)
CO2: 28 mmol/L (ref 22–32)
Calcium: 8.9 mg/dL (ref 8.9–10.3)
Chloride: 105 mmol/L (ref 98–111)
Creatinine, Ser: 0.54 mg/dL (ref 0.44–1.00)
GFR, Estimated: >60 mL/min (ref >60)
Glucose, Bld: 98 mg/dL (ref 70–99)
Potassium: 3.8 mmol/L (ref 3.5–5.1)
Sodium: 137 mmol/L (ref 135–145)
Total Bilirubin: 0.9 mg/dL (ref 0.3–1.2)
Total Protein: 4.8 g/dL — ABNORMAL LOW (ref 6.5–8.1)

## 2021-02-19 LAB — BPAM RBC
Blood Product Expiration Date: 202204202359
Blood Product Expiration Date: 202204212359
Blood Product Expiration Date: 202204212359
Blood Product Expiration Date: 202204212359
ISSUE DATE / TIME: 202203201326
ISSUE DATE / TIME: 202203202148
ISSUE DATE / TIME: 202203210337
Unit Type and Rh: 5100
Unit Type and Rh: 5100
Unit Type and Rh: 5100
Unit Type and Rh: 5100

## 2021-02-19 LAB — CEA: CEA: 0.7 ng/mL (ref 0.0–4.7)

## 2021-02-19 LAB — CBC WITH DIFFERENTIAL/PLATELET
Abs Immature Granulocytes: 0.03 10*3/uL (ref 0.00–0.07)
Basophils Absolute: 0 10*3/uL (ref 0.0–0.1)
Basophils Relative: 0 %
Eosinophils Absolute: 0 10*3/uL (ref 0.0–0.5)
Eosinophils Relative: 0 %
HCT: 25.3 % — ABNORMAL LOW (ref 36.0–46.0)
Hemoglobin: 8.4 g/dL — ABNORMAL LOW (ref 12.0–15.0)
Immature Granulocytes: 0 %
Lymphocytes Relative: 15 %
Lymphs Abs: 1.4 10*3/uL (ref 0.7–4.0)
MCH: 31.1 pg (ref 26.0–34.0)
MCHC: 33.2 g/dL (ref 30.0–36.0)
MCV: 93.7 fL (ref 80.0–100.0)
Monocytes Absolute: 0.6 10*3/uL (ref 0.1–1.0)
Monocytes Relative: 6 %
Neutro Abs: 7.5 10*3/uL (ref 1.7–7.7)
Neutrophils Relative %: 79 %
Platelets: 268 10*3/uL (ref 150–400)
RBC: 2.7 MIL/uL — ABNORMAL LOW (ref 3.87–5.11)
RDW: 17.5 % — ABNORMAL HIGH (ref 11.5–15.5)
WBC: 9.6 10*3/uL (ref 4.0–10.5)
nRBC: 0 % (ref 0.0–0.2)

## 2021-02-19 LAB — CHROMOGRANIN A
Chromogranin A (ng/mL): 191.2 ng/mL — ABNORMAL HIGH (ref 0.0–101.8)
Chromogranin A (ng/mL): 115.6 ng/mL — ABNORMAL HIGH (ref 0.0–101.8)

## 2021-02-19 MED ORDER — LACTATED RINGERS IV BOLUS
500.0000 mL | Freq: Once | INTRAVENOUS | Status: AC
  Administered 2021-02-19: 500 mL via INTRAVENOUS

## 2021-02-19 MED ORDER — SODIUM CHLORIDE 0.9 % IV BOLUS
250.0000 mL | Freq: Once | INTRAVENOUS | Status: AC
  Administered 2021-02-19: 250 mL via INTRAVENOUS

## 2021-02-19 MED ORDER — MORPHINE SULFATE (PF) 2 MG/ML IV SOLN: 2.0000 mg | INTRAVENOUS | Status: DC | PRN

## 2021-02-19 MED ORDER — HYDROMORPHONE HCL 1 MG/ML IJ SOLN
1.0000 mg | INTRAMUSCULAR | Status: DC | PRN
  Administered 2021-02-19 – 2021-02-21 (×10): 1 mg via INTRAVENOUS
  Filled 2021-02-19 (×10): qty 1

## 2021-02-19 MED ORDER — METHOCARBAMOL 1000 MG/10ML IJ SOLN
1000.0000 mg | Freq: Three times a day (TID) | INTRAVENOUS | Status: DC | PRN
  Administered 2021-02-19: 1000 mg via INTRAVENOUS
  Filled 2021-02-19 (×2): qty 10

## 2021-02-19 NOTE — Progress Notes (Signed)
Patient ID: Daylah Sayavong, female   DOB: [birth year], 45 y.o.   MRN: [id number] 1 Day Post-Op   Subjective: A lot of pain abd wall, no flatus ROS negative except as listed above. Objective: Vital signs in last 24 hours: Temp:  [97.2 F (36.2 C)-99 F (37.2 C)] 97.9 F (36.6 C) (03/22 0414) Pulse Rate:  [64-95] 68 (03/22 0414) Resp:  [9-18] 14 (03/22 0414) BP: (89-113)/(53-64) 100/60 (03/22 0551) SpO2:  [96 %-99 %] 99 % (03/22 0414) Last BM Date: 02/14/21  Intake/Output from previous day: 03/21 0701 - 03/22 0700 In: 600 [I.V.:600] Out: 1875 [Urine:1850; Blood:25] Intake/Output this shift: No intake/output data recorded.  General appearance: cooperative Resp: clear to auscultation bilaterally Cardio: regular rate and rhythm GI: soft, quiet, dressing ok Extremities: calves soft Neurologic: Mental status: Alert, oriented, thought content appropriate  Lab Results: CBC  Recent Labs    02/17/21 0212 02/18/21 0353 02/19/21 0450  WBC 6.7  --  9.6  HGB 7.2* 9.0* 8.4*  HCT 22.3* 28.3* 25.3*  PLT 212  --  268   BMET Recent Labs    02/17/21 0212 02/19/21 0450  NA 136 137  K 3.8 3.8  CL 108 105  CO2 24 28  GLUCOSE 97 98  BUN 14 6  CREATININE 0.47 0.54  CALCIUM 7.9* 8.9   PT/INR No results for input(s): LABPROT, INR in the last 72 hours. ABG No results for input(s): PHART, HCO3 in the last 72 hours.  Invalid input(s): PCO2, PO2  Studies/Results: [place] EKG SITE RITE  Result Date: 02/17/2021 If Site Rite image not attached, placement could not be confirmed due to current cardiac rhythm.   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 Grandville Silos - NGT - Await bowel function - D/C foley - await path ABL anemia  FEN: LR bolus, adjust pain meds ID: None: DVT: SCDs, LMWH tomorrow if Hb stabilizes   Plan:above, PT, I spoke with her husband  LOS: 7 days    Georganna Skeans, MD, MPH, FACS Trauma & General Surgery Use [email or web address] to contact on call provider  02/19/2021

## 2021-02-19 NOTE — Plan of Care (Signed)

## 2021-02-19 NOTE — Progress Notes (Signed)
Orthopedic Tech Progress Note Patient Details:  Paige Robertson 07-16-1976 [id number] PT reached out about patient needing an ABDOMINAL BINDER. Dropped off. Ortho Devices Type of Ortho Device: Abdominal binder Ortho Device/Splint Location: stomach Ortho Device/Splint Interventions: Ordered   Post Interventions Patient Tolerated: Well Instructions Provided: Care of device   Janit Pagan 02/19/2021, 11:10 AM

## 2021-02-19 NOTE — Evaluation (Signed)
Physical Therapy Evaluation Patient Details Name: Paige Robertson MRN: [id number] DOB: 1975-12-18 Today's Date: 02/19/2021   History of Present Illness  45 y.o. female who presents to the ED 3/14 for evaluation of a near syncopal episode. Hgb 7.6 on admission, dropped to 5.8 on 3/15  3/21 CT of abdomen revealed hypervascular mass in the right central pelvis measuring 10 cm. S/P resection of 10cm proximal jejunal mass. 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  Clinical Impression  PTA pt living in multistory home with bed and bath on main floor and 3 steps to enter with husband and 2 school age daughters. 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 Pt with abdominal binder in room that was too large. Contacted [facility] for smaller size and came back for OOB after it arrived. Educated pt in log rolling and sidelying to sit to reduce torque on her abdominal incision. Pt is min A for bed mobility and min guard for transfers and ambulation. Pt with resting 3/10 pain, 6/10 pain with ambulation and reduction in pain with return to supine. Educated pt on benefits of OOB in recliner for improved GI function when pt pain levels are lower. Pt will not need any further PT services at discharge however PT will continue to follow her acutely.     Follow Up Recommendations No PT follow up;Supervision/Assistance - 24 hour    Equipment Recommendations  None recommended by PT (has access to RW if needed)    Recommendations for Other Services   OT consult    Precautions / Restrictions Precautions Precautions: None Precaution Comments: NG tube, Foley Required Braces or Orthoses: Other Brace Other Brace: abdominal binder for OOB      Mobility  Bed Mobility Overal bed mobility: Needs Assistance Bed Mobility: Rolling;Sidelying to Sit;Sit to Sidelying Rolling: Min assist Sidelying to sit: Min  assist     Sit to sidelying: Min assist General bed mobility comments: min A for coming all the way up on her hip, min A for bringing trunk to upright, utilized pillow for bracing of incisional site, applied binder in seated EoB, min A for bringing LE back into bed    Transfers Overall transfer level: Needs assistance Equipment used: Rolling walker (2 wheeled) Transfers: Sit to/from Stand Sit to Stand: Min guard         General transfer comment: min guard for safety with power up and steadying with RW  Ambulation/Gait Ambulation/Gait assistance: Min guard Gait Distance (Feet): 20 Feet Assistive device: Rolling walker (2 wheeled) Gait Pattern/deviations: Step-through pattern;Decreased step length - right;Decreased step length - left;Shuffle Gait velocity: slowed Gait velocity interpretation: <1.31 ft/sec, indicative of household ambulator General Gait Details: min guard for safety and management of lines and leads with ambulation to door and back         Balance Overall balance assessment: Needs assistance Sitting-balance support: Feet supported;No upper extremity supported Sitting balance-Leahy Scale: Fair     Standing balance support: No upper extremity supported;Bilateral upper extremity supported;Single extremity supported Standing balance-Leahy Scale: Fair                               Pertinent Vitals/Pain Pain Assessment: 0-10 Pain Score: 3  (6 with ambulation) Pain Location: R abdomen Pain Descriptors / Indicators: Sharp;Shooting;Aching;Sore Pain Intervention(s): Premedicated before session;Monitored during session;Limited activity within patient's tolerance    Home  Living Family/patient expects to be discharged to:: Private residence Living Arrangements: Spouse/significant other;Children   Type of Home: House Home Access: Stairs to enter Entrance Stairs-Rails: Left Entrance Stairs-Number of Steps: 3 Home Layout: Two level Home Equipment:  Shower seat - built in;Walker - 2 wheels;Hand held shower head      Prior Function Level of Independence: Independent         Comments: work as Insurance account manager at [employer]        Extremity/Trunk Assessment   Upper Extremity Assessment Upper Extremity Assessment: Overall WFL for tasks assessed    Lower Extremity Assessment Lower Extremity Assessment: Overall WFL for tasks assessed    Cervical / Trunk Assessment Cervical / Trunk Assessment: Other exceptions (abdominal incision limiting mobility)  Communication   Communication: No difficulties  Cognition Arousal/Alertness: Awake/alert Behavior During Therapy: WFL for tasks assessed/performed Overall Cognitive Status: Within Functional Limits for tasks assessed                                        General Comments General comments (skin integrity, edema, etc.): VSS on RA        Assessment/Plan    PT Assessment Patient needs continued PT services  PT Problem List Decreased strength;Decreased range of motion;Decreased activity tolerance;Decreased balance;Decreased mobility;Pain       PT Treatment Interventions DME instruction;Gait training;Stair training;Functional mobility training;Therapeutic activities;Therapeutic exercise;Balance training;Cognitive remediation;Patient/family education    PT Goals (Current goals can be found in the Care Plan section)  Acute Rehab PT Goals Patient Stated Goal: go home as soon as possible PT Goal Formulation: With patient Time For Goal Achievement: 03/05/21 Potential to Achieve Goals: Good    Frequency Min 3X/week    AM-PAC PT "6 Clicks" Mobility  Outcome Measure Help needed turning from your back to your side while in a flat bed without using bedrails?: A Little Help needed moving from lying on your back to sitting on the side of a flat bed without using bedrails?: A Little Help needed moving to and from a bed to a chair (including  a wheelchair)?: None Help needed standing up from a chair using your arms (e.g., wheelchair or bedside chair)?: None Help needed to walk in hospital room?: None Help needed climbing 3-5 steps with a railing? : A Little 6 Click Score: 21    End of Session   Activity Tolerance: Patient limited by pain Patient left: in bed;with call bell/phone within reach;with family/visitor present Nurse Communication: Mobility status PT Visit Diagnosis: Other abnormalities of gait and mobility (R26.89);Difficulty in walking, not elsewhere classified (R26.2);Pain Pain - part of body:  (abdominal)    Time: 4098-1191 and 4782-9562 PT Time Calculation (min) (ACUTE ONLY): 29 min   Charges:   PT Evaluation $PT Eval Moderate Complexity: 1 Mod PT Treatments $Therapeutic Activity: 8-22 mins        Paige Robertson PT, DPT Acute Rehabilitation Services Pager [phone] Office [phone]   Elon Alas Fleet 02/19/2021, 1:14 PM

## 2021-02-20 LAB — CBC
HCT: 25.9 % — ABNORMAL LOW (ref 36.0–46.0)
Hemoglobin: 8.2 g/dL — ABNORMAL LOW (ref 12.0–15.0)
MCH: 30.7 pg (ref 26.0–34.0)
MCHC: 31.7 g/dL (ref 30.0–36.0)
MCV: 97 fL (ref 80.0–100.0)
Platelets: 307 10*3/uL (ref 150–400)
RBC: 2.67 MIL/uL — ABNORMAL LOW (ref 3.87–5.11)
RDW: 17.3 % — ABNORMAL HIGH (ref 11.5–15.5)
WBC: 11.4 10*3/uL — ABNORMAL HIGH (ref 4.0–10.5)
nRBC: 0 % (ref 0.0–0.2)

## 2021-02-20 LAB — BASIC METABOLIC PANEL
Anion gap: 7 (ref 5–15)
BUN: 10 mg/dL (ref 6–20)
CO2: 22 mmol/L (ref 22–32)
Calcium: 8 mg/dL — ABNORMAL LOW (ref 8.9–10.3)
Chloride: 105 mmol/L (ref 98–111)
Creatinine, Ser: 0.51 mg/dL (ref 0.44–1.00)
GFR, Estimated: >60 mL/min (ref >60)
Glucose, Bld: 83 mg/dL (ref 70–99)
Potassium: 3.4 mmol/L — ABNORMAL LOW (ref 3.5–5.1)
Sodium: 134 mmol/L — ABNORMAL LOW (ref 135–145)

## 2021-02-20 LAB — MAGNESIUM: Magnesium: 1.8 mg/dL (ref 1.7–2.4)

## 2021-02-20 MED ORDER — POTASSIUM CHLORIDE 10 MEQ/100ML IV SOLN
10.0000 meq | INTRAVENOUS | Status: AC
  Administered 2021-02-20 (×3): 10 meq via INTRAVENOUS
  Filled 2021-02-20 (×3): qty 100

## 2021-02-20 MED ORDER — PHENOL 1.4 % MT LIQD: 1.0000 | OROMUCOSAL | Status: DC | PRN

## 2021-02-20 MED ORDER — ACETAMINOPHEN 10 MG/ML IV SOLN
1000.0000 mg | Freq: Four times a day (QID) | INTRAVENOUS | Status: AC
  Administered 2021-02-20 – 2021-02-21 (×4): 1000 mg via INTRAVENOUS
  Filled 2021-02-20 (×4): qty 100

## 2021-02-20 MED ORDER — ENOXAPARIN SODIUM 40 MG/0.4ML ~~LOC~~ SOLN
40.0000 mg | SUBCUTANEOUS | Status: DC
  Administered 2021-02-20 – 2021-02-21 (×2): 40 mg via SUBCUTANEOUS
  Filled 2021-02-20 (×2): qty 0.4

## 2021-02-20 NOTE — Evaluation (Signed)
Occupational Therapy Evaluation/Discharge Patient Details Name: Paige Robertson MRN: [id number] DOB: 21-Nov-1976 Today's Date: 02/20/2021    History of Present Illness 45 y.o. female who presents to the ED 3/14 for evaluation of a near syncopal episode. Hgb 7.6 on admission, dropped to 5.8 on 3/15  3/21 CT of abdomen revealed hypervascular mass in the right central pelvis measuring 10 cm. S/P resection of 10cm proximal jejunal mass. 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   Clinical Impression   PTA, pt lives with spouse and young children, works full time at [employer] and independent in all daily tasks. Pt presents today with improved pain levels. Pt able to demonstrate long distance mobility in hallway with RW and progressed to no AD without LOB. Pt able to complete toileting task independently without physical assist. Educated on techniques that may ease abdominal discomfort for LB ADLs. Assessed orthostatic BPs during session which were [facility] (see below). Husband present and supportive throughout, plans to stay with pt for a few days at home before he returns to work. Encouraged pt to complete mobility to/from bathroom with assist as needed. Anticipate pt to return to PLOF quickly with no safety concerns. OT to sign off at acute level.     Follow Up Recommendations  No OT follow up;Supervision - Intermittent    Equipment Recommendations  None recommended by OT    Recommendations for Other Services       Precautions / Restrictions Precautions Precautions: None Required Braces or Orthoses: Other Brace Other Brace: abdominal binder for OOB Restrictions Weight Bearing Restrictions: No      Mobility Bed Mobility Overal bed mobility: Needs Assistance Bed Mobility: Rolling;Sidelying to Sit Rolling: Supervision Sidelying to sit: Supervision       General bed mobility comments: Supervision for  log rolling, cueing for sequencing with good pt follow through    Transfers Overall transfer level: Modified independent Equipment used: Rolling walker (2 wheeled);None Transfers: Sit to/from [employer] to Stand: Modified independent (Device/Increase time) Stand pivot transfers: Modified independent (Device/Increase time)       General transfer comment: able to use RW for mobility in hallway without physical assist, trialed without AD and supervision provided and no overt LOB though pt did appear more cautious without AD. Able to ambulate back in room to bathroom for toileting task without AD    Balance Overall balance assessment: Mild deficits observed, not formally tested                                         ADL either performed or assessed with clinical judgement   ADL Overall ADL's : Modified independent;Independent                                       General ADL Comments: Only assistance to don abdominal binder sitting EOB. Pt able to demo mobility in room, hallway with RW and without AD, no overt LOB. Pt able to perform toileting task without assist, bring feet to self to reach socks. Husband present and assisting     Vision Patient Visual Report: No change from baseline Vision Assessment?: No apparent visual deficits     Perception     Praxis  Pertinent Vitals/Pain Pain Assessment: No/denies pain Pain Location: R abdomen Pain Intervention(s): Monitored during session;Premedicated before session     Hand Dominance Right   Extremity/Trunk Assessment Upper Extremity Assessment Upper Extremity Assessment: Overall WFL for tasks assessed   Lower Extremity Assessment Lower Extremity Assessment: Defer to PT evaluation   Cervical / Trunk Assessment Cervical / Trunk Assessment: Normal   Communication Communication Communication: No difficulties   Cognition Arousal/Alertness: Awake/alert Behavior  During Therapy: WFL for tasks assessed/performed Overall Cognitive Status: Within Functional Limits for tasks assessed                                     General Comments  assessed orthostatic BP, WFL. 100/68 in bed, 97/70 sitting EOB and 117/74 standing at bedside.    Exercises     Shoulder Instructions      Home Living Family/patient expects to be discharged to:: Private residence Living Arrangements: Spouse/significant other;Children Available Help at Discharge: Family;Available 24 hours/day Type of Home: House Home Access: Stairs to enter [employer] of Steps: 3 Entrance Stairs-Rails: Left Home Layout: Two level Alternate Level Stairs-Number of Steps: 12-15   Bathroom Shower/Tub: Occupational psychologist: Standard Bathroom Accessibility: Yes   Home Equipment: Shower seat - built in;Walker - 2 wheels;Hand held shower head          Prior Functioning/Environment Level of Independence: Independent        Comments: work as Restaurant manager, fast food at [employer]        OT Problem List:        OT Treatment/Interventions:      OT Goals(Current goals can be found in the care plan section) Acute Rehab OT Goals Patient Stated Goal: go home as soon as possible  OT Frequency:     Barriers to D/C:            Co-evaluation              AM-PAC OT "6 Clicks" Daily Activity     Outcome Measure Help from another person eating meals?: None Help from another person taking care of personal grooming?: None Help from another person toileting, which includes using toliet, bedpan, or urinal?: None Help from another person bathing (including washing, rinsing, drying)?: None Help from another person to put on and taking off regular upper body clothing?: None Help from another person to put on and taking off regular lower body clothing?: None 6 Click Score: 24   End of Session Equipment Utilized During Treatment: Gait belt;Rolling  walker Nurse Communication: Mobility status;Other (comment) (BP)  Activity Tolerance: Patient tolerated treatment well Patient left: in chair;with call bell/phone within reach;with family/visitor present;with nursing/sitter in room  OT Visit Diagnosis: Unsteadiness on feet (R26.81)                Time: 7673-4193 OT Time Calculation (min): 26 min Charges:  OT General Charges $OT Visit: 1 Visit OT Evaluation $OT Eval Low Complexity: 1 Low OT Treatments $Self Care/Home Management : 8-22 mins  Malachy Chamber, OTR/L Acute Rehab Services Office: [phone]  Layla Maw 02/20/2021, 1:53 PM

## 2021-02-20 NOTE — Progress Notes (Signed)
[facility] Progress Note  2 Days Post-Op  Subjective: CC-  Abdomen still sore but pain control improved after medication adjustments yesterday. States that she is feeling gurgling in her abdomen which is new, no flatus or BM. NG tube with only 150cc output last 24 hours. States that she has more discomfort from NG tube than in her abdomen. Denies n/v. She did get OOB with PT yesterday. Foley removed last night and she has not yet voided.  Objective: Vital signs in last 24 hours: Temp:  [97.7 F (36.5 C)-98.9 F (37.2 C)] 98.9 F (37.2 C) (03/23 0734) Pulse Rate:  [94-100] 94 (03/23 0734) Resp:  [15-18] 16 (03/23 0734) BP: (101-105)/(57-68) 103/68 (03/23 0734) SpO2:  [95 %-99 %] 99 % (03/23 0734) Last BM Date: 02/14/21  Intake/Output from previous day: No intake/output data recorded. Intake/Output this shift: Total I/O In: 30 [NG/GT:30] Out: 150 [Emesis/NG output:150]  PE: Gen:  Alert, NAD, pleasant Card:  RRR Pulm:  CTAB, no W/R/R, rate and effort normal Abd: Soft, mild distension, few BS heard, midline incision with honeycomb dressing in place  Lab Results:  Recent Labs    02/18/21 0353 02/19/21 0450  WBC  --  9.6  HGB 9.0* 8.4*  HCT 28.3* 25.3*  PLT  --  268   BMET Recent Labs    02/19/21 0450  NA 137  K 3.8  CL 105  CO2 28  GLUCOSE 98  BUN 6  CREATININE 0.54  CALCIUM 8.9   PT/INR No results for input(s): LABPROT, INR in the last 72 hours. CMP     Component Value Date/Time   NA 137 02/19/2021 0450   NA 141 10/01/2017 1115   K 3.8 02/19/2021 0450   CL 105 02/19/2021 0450   CO2 28 02/19/2021 0450   GLUCOSE 98 02/19/2021 0450   BUN 6 02/19/2021 0450   BUN 10 10/01/2017 1115   CREATININE 0.54 02/19/2021 0450   CALCIUM 8.9 02/19/2021 0450   PROT 4.8 (L) 02/19/2021 0450   PROT 7.0 10/01/2017 1115   ALBUMIN 2.6 (L) 02/19/2021 0450   ALBUMIN 4.4 10/01/2017 1115   AST 23 02/19/2021 0450   ALT 27 02/19/2021 0450   ALKPHOS 76  02/19/2021 0450   BILITOT 0.9 02/19/2021 0450   BILITOT 0.7 10/01/2017 1115   GFRNONAA >60 02/19/2021 0450   GFRAA >60 07/09/2020 0502   Lipase  No results found for: LIPASE     Studies/Results: No results found.  Anti-infectives: Anti-infectives (From admission, onward)   Start     Dose/Rate Route Frequency Ordered Stop   02/18/21 0930  cefoTEtan (CEFOTAN) 2 g in sodium chloride 0.9 % 100 mL IVPB        2 g 200 mL/hr over 30 Minutes Intravenous  Once 02/18/21 0920 02/19/21 0944   02/18/21 0921  sodium chloride 0.9 % with cefoTEtan (CEFOTAN) ADS Med       Note to Pharmacy: Grace Blight   : cabinet override      02/18/21 9242 02/18/21 0956       Assessment/Plan Recurrent GI bleed/Hx angiodysplasia of the duodenum 10 cm right/ central pelvic mass - S/P resection of 10cm proximal jejunal mass 3/21 by Dr. Grandville Silos - POD#2 - surgical path pending - D/c NG tube. Only ice chips for now until improved bowel function.  - Mobilize, continue PT   ABL anemia - Hgb 8.4 (3/22), CBC pending today  FEN: IVF, ice chips ID: cefotetan periop DVT: SCDs, check CBC - start  LMWH if Hb stabilize Foley: removed 3/22 and pt has not voided, getting OOB to [facility] - if unable to void needs bladder scan and possible foley replacement   LOS: 8 days    Wellington Hampshire, [facility] 02/20/2021, 9:15 AM Please see Amion for pager number during day hours 7:00am-4:30pm

## 2021-02-21 DIAGNOSIS — E440 Moderate protein-calorie malnutrition: Secondary | ICD-10-CM | POA: Insufficient documentation

## 2021-02-21 LAB — BASIC METABOLIC PANEL
Anion gap: 8 (ref 5–15)
BUN: 10 mg/dL (ref 6–20)
CO2: 21 mmol/L — ABNORMAL LOW (ref 22–32)
Calcium: 8.5 mg/dL — ABNORMAL LOW (ref 8.9–10.3)
Chloride: 108 mmol/L (ref 98–111)
Creatinine, Ser: 0.54 mg/dL (ref 0.44–1.00)
GFR, Estimated: >60 mL/min (ref >60)
Glucose, Bld: 73 mg/dL (ref 70–99)
Potassium: 3.4 mmol/L — ABNORMAL LOW (ref 3.5–5.1)
Sodium: 137 mmol/L (ref 135–145)

## 2021-02-21 LAB — CBC
HCT: 24.8 % — ABNORMAL LOW (ref 36.0–46.0)
Hemoglobin: 7.6 g/dL — ABNORMAL LOW (ref 12.0–15.0)
MCH: 30 pg (ref 26.0–34.0)
MCHC: 30.6 g/dL (ref 30.0–36.0)
MCV: 98 fL (ref 80.0–100.0)
Platelets: 254 10*3/uL (ref 150–400)
RBC: 2.53 MIL/uL — ABNORMAL LOW (ref 3.87–5.11)
RDW: 17.3 % — ABNORMAL HIGH (ref 11.5–15.5)
WBC: 5.5 10*3/uL (ref 4.0–10.5)
nRBC: 0 % (ref 0.0–0.2)

## 2021-02-21 LAB — SURGICAL PATHOLOGY

## 2021-02-21 MED ORDER — OXYCODONE HCL 5 MG PO TABS
5.0000 mg | ORAL_TABLET | ORAL | Status: DC | PRN
  Administered 2021-02-21 (×2): 5 mg via ORAL
  Administered 2021-02-21: 10 mg via ORAL
  Filled 2021-02-21 (×2): qty 1
  Filled 2021-02-21: qty 2

## 2021-02-21 MED ORDER — PANTOPRAZOLE SODIUM 40 MG PO TBEC
40.0000 mg | DELAYED_RELEASE_TABLET | Freq: Two times a day (BID) | ORAL | Status: DC
Start: 2021-02-21 — End: 2021-02-23
  Administered 2021-02-21 – 2021-02-22 (×2): 40 mg via ORAL
  Filled 2021-02-21 (×2): qty 1

## 2021-02-21 MED ORDER — ENSURE ENLIVE PO LIQD
237.0000 mL | Freq: Three times a day (TID) | ORAL | Status: DC
  Administered 2021-02-21 – 2021-02-22 (×3): 237 mL via ORAL

## 2021-02-21 NOTE — Progress Notes (Signed)
3 Days Post-Op   Subjective: Up in chair Passing a lot of gas ROS negative except as listed above. Objective: Vital signs in last 24 hours: Temp:  [97.8 F (36.6 C)-98.6 F (37 C)] 97.8 F (36.6 C) (03/24 0738) Pulse Rate:  [76-99] 77 (03/24 0738) Resp:  [15-18] 16 (03/24 0738) BP: (89-117)/(51-74) 89/51 (03/24 0738) SpO2:  [98 %-100 %] 99 % (03/24 0738) Last BM Date:  (pta)  Intake/Output from previous day: 03/23 0701 - 03/24 0700 In: 3509.9 [P.O.:120; I.V.:2559.9; NG/GT:30; IV Piggyback:800] Out: 850 [Urine:700; Emesis/NG output:150] Intake/Output this shift: Total I/O In: 10 [I.V.:10] Out: -   General appearance: cooperative Resp: clear to auscultation bilaterally Cardio: regular rate and rhythm GI: soft, incision dressing dry stain  Lab Results: CBC  Recent Labs    02/20/21 0905 02/21/21 0408  WBC 11.4* 5.5  HGB 8.2* 7.6*  HCT 25.9* 24.8*  PLT 307 254   BMET Recent Labs    02/20/21 0905 02/21/21 0408  NA 134* 137  K 3.4* 3.4*  CL 105 108  CO2 22 21*  GLUCOSE 83 73  BUN 10 10  CREATININE 0.51 0.54  CALCIUM 8.0* 8.5*   PT/INR No results for input(s): LABPROT, INR in the last 72 hours. ABG No results for input(s): PHART, HCO3 in the last 72 hours.  Invalid input(s): PCO2, PO2  Studies/Results: No results found.  Anti-infectives: Anti-infectives (From admission, onward)   Start     Dose/Rate Route Frequency Ordered Stop   02/18/21 0930  cefoTEtan (CEFOTAN) 2 g in sodium chloride 0.9 % 100 mL IVPB        2 g 200 mL/hr over 30 Minutes Intravenous  Once 02/18/21 0920 02/19/21 0944   02/18/21 0921  sodium chloride 0.9 % with cefoTEtan (CEFOTAN) ADS Med       Note to Pharmacy: Grace Blight   : cabinet override      02/18/21 6295 02/18/21 0956      Assessment/Plan: Recurrent GI bleed/Hx angiodysplasia of the duodenum 10 cm right/ central pelvic mass - S/P resection of 10cm proximal jejunal mass 3/21 by Dr. Grandville Silos - surgical path  pending - fulls - Mobilize, continue PT   ABL anemia - Hgb 7.6, no melena  FEN: IVF, ice chips ID: cefotetan periop DVT: SCDs, LMWH Foley: out Dispo - home tomorrow PM or Saturday AM  LOS: 9 days    Georganna Skeans, MD, MPH, FACS Trauma & General Surgery Use [email or web address] to contact on call provider  3/24/2022Patient ID: Paige Robertson, female   DOB: 1976-03-09, 45 y.o.   MRN: [id number]

## 2021-02-21 NOTE — Progress Notes (Signed)
Initial Nutrition Assessment  DOCUMENTATION CODES:   Non-severe (moderate) malnutrition in context of acute illness/injury  INTERVENTION:    Ensure Enlive/Plus po TID, each supplement provides 350 kcal and 13-20 grams of protein.  NUTRITION DIAGNOSIS:   Moderate Malnutrition related to acute illness (jejunal mass S/P resection) as evidenced by energy intake < 75% for > 7 days,mild muscle depletion.  GOAL:   Patient will meet greater than or equal to 90% of their needs  MONITOR:   PO intake,Supplement acceptance  REASON FOR ASSESSMENT:   NPO/Clear Liquid Diet    ASSESSMENT:   45 yo female admitted with near syncopal episode. 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 PMH includes angiodysplasia of the duodenum, HLD, IDA, melena, acid reflux.  Patient has been on and off a clear liquid diet since admission. She has been on a regular diet for approximately 2-3 meals since admission. Inadequate intake x 9 days now.   Diet just advanced to full liquids. Patient and husband confirm minimal intake for the past 2 weeks. Answered questions about home diet and supplements to try at home after discharge. She agreed to drink strawberry Ensure Enlive po TID to maximize oral intake. Recommend continue Ensure Enlive or equivalent supplement BID at home until PO intake returns to normal.   Labs reviewed. K 3.4 Medications reviewed and include vitamin B-12 tablet. IVF: NS at 75 ml/h  Weight down by 5% over the past 3 months.  Patient meets criteria for moderate malnutrition, given mild depletion of muscle mass and intake meeting <75% of estimated energy requirement for > 7 days.  NUTRITION - FOCUSED PHYSICAL EXAM:  Flowsheet Row Most Recent Value  Orbital Region No depletion  Upper Arm Region No depletion  Thoracic and Lumbar Region No depletion  Buccal Region No depletion  Temple Region Mild depletion  Clavicle Bone Region Mild depletion  Clavicle and Acromion Bone Region  No depletion  Scapular Bone Region No depletion  Dorsal Hand No depletion  Patellar Region No depletion  Anterior Thigh Region No depletion  Posterior Calf Region No depletion  Edema (RD Assessment) None  Hair Reviewed  Eyes Reviewed  Mouth Reviewed  Skin Reviewed  Nails Reviewed       Diet Order:   Diet Order            Diet full liquid Room service appropriate? Yes; Fluid consistency: Thin  Diet effective now                 EDUCATION NEEDS:   Education needs have been addressed  Skin:  Skin Assessment: Reviewed RN Assessment  Last BM:  no BM documented  Height:   Ht Readings from Last 1 Encounters:  02/18/21 5' 2.01" (1.575 m)    Weight:   Wt Readings from Last 1 Encounters:  02/18/21 57.2 kg    Ideal Body Weight:  50 kg  BMI:  Body mass index is 23.06 kg/m.  Estimated Nutritional Needs:   Kcal:  1700-1900  Protein:  75-90 gm  Fluid:  >/= 1.7 L    Lucas Mallow, RD, LDN, CNSC Please refer to Amion for contact information.

## 2021-02-21 NOTE — Progress Notes (Signed)
Physical Therapy Treatment Patient Details Name: Paige Robertson MRN: [id number] DOB: 1976-04-24 Today's Date: 02/21/2021    History of Present Illness 45 y.o. female who presents to the ED 3/14 for evaluation of a near syncopal episode. Hgb 7.6 on admission, dropped to 5.8 on 3/15  3/21 CT of abdomen revealed hypervascular mass in the right central pelvis measuring 10 cm. S/P resection of 10cm proximal jejunal mass. 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    PT Comments    Pt supine in bed on entry with mother in room. Pt agreeable to therapy, stating that she was able to ambulate this morning, however forgot to place binder for mobility. Pt reports she thinks that is why ambulation was more painful. Pt able to teach back and then demonstrate good log rolling and power up into seated. Once in seated PT placed binder behind pt and educated on self donning. Pt able to perform in seated and readjust in standing. Pt progressed her ambulation distance with use of RW, readjusted RW height to decrease elevated shoulders and increase comfort with ambulation. D/c plans remain appropriate. PT will continue to follow acutely.    Follow Up Recommendations  No PT follow up;Supervision/Assistance - 24 hour     Equipment Recommendations  None recommended by PT (has access to RW if needed)       Precautions / Restrictions Precautions Precautions: None Precaution Comments: NG tube, Foley Required Braces or Orthoses: Other Brace Other Brace: abdominal binder for OOB Restrictions Weight Bearing Restrictions: No    Mobility  Bed Mobility Overal bed mobility: Needs Assistance Bed Mobility: Rolling;Sidelying to Sit;Sit to Sidelying Rolling: Supervision Sidelying to sit: Supervision     Sit to sidelying: Supervision General bed mobility comments: supervision for safety, pt with good teach back prior to movement     Transfers Overall transfer level: Needs assistance Equipment used: Rolling walker (2 wheeled) Transfers: Sit to/from Stand Sit to Stand: Supervision         General transfer comment: able to power up and self steady before walking to RW  Ambulation/Gait Ambulation/Gait assistance: Min guard Gait Distance (Feet): 500 Feet Assistive device: Rolling walker (2 wheeled) Gait Pattern/deviations: Step-through pattern;Decreased step length - right;Decreased step length - left;Shuffle Gait velocity: slowed Gait velocity interpretation: <1.8 ft/sec, indicate of risk for recurrent falls General Gait Details: min guard for safety, pt reports feeling better using RW          Balance Overall balance assessment: Needs assistance Sitting-balance support: Feet supported;No upper extremity supported Sitting balance-Leahy Scale: Fair     Standing balance support: No upper extremity supported;Bilateral upper extremity supported;Single extremity supported Standing balance-Leahy Scale: Fair                              Cognition Arousal/Alertness: Awake/alert Behavior During Therapy: WFL for tasks assessed/performed Overall Cognitive Status: Within Functional Limits for tasks assessed                                           General Comments General comments (skin integrity, edema, etc.): VSS on RA, with minimal assist to place abdominal binder behind pt's back pt able to don and readjust binder in standing, pt reports decreased pain with ambulation using binder  Pertinent Vitals/Pain Pain Assessment: Faces Faces Pain Scale: Hurts a little bit Pain Location: R abdomen Pain Descriptors / Indicators: Aching;Sore;Sharp Pain Intervention(s): Limited activity within patient's tolerance;Monitored during session;Repositioned;Other (comment) (utilized binder with ambulation)           PT Goals (current goals can now be found in the care plan section)  Acute Rehab PT Goals Patient Stated Goal: go home as soon as possible PT Goal Formulation: With patient Time For Goal Achievement: 03/05/21 Potential to Achieve Goals: Good    Frequency    Min 3X/week      PT Plan Current plan remains appropriate       AM-PAC PT "6 Clicks" Mobility   Outcome Measure  Help needed turning from your back to your side while in a flat bed without using bedrails?: A Little Help needed moving from lying on your back to sitting on the side of a flat bed without using bedrails?: A Little Help needed moving to and from a bed to a chair (including a wheelchair)?: None Help needed standing up from a chair using your arms (e.g., wheelchair or bedside chair)?: None Help needed to walk in hospital room?: None Help needed climbing 3-5 steps with a railing? : A Little 6 Click Score: 21    End of Session   Activity Tolerance: Patient limited by pain Patient left: in bed;with call bell/phone within reach;with family/visitor present Nurse Communication: Mobility status PT Visit Diagnosis: Other abnormalities of gait and mobility (R26.89);Difficulty in walking, not elsewhere classified (R26.2);Pain Pain - part of body:  (abdominal)     Time: 1610-9604 PT Time Calculation (min) (ACUTE ONLY): 18 min  Charges:  $Therapeutic Exercise: 8-22 mins                     Jordyan Hardiman B. Beverely Risen PT, DPT Acute Rehabilitation Services Pager [phone] Office [phone]    Elon Alas Fleet 02/21/2021, 1:49 PM

## 2021-02-22 ENCOUNTER — Other Ambulatory Visit: Payer: Self-pay | Admitting: General Surgery

## 2021-02-22 ENCOUNTER — Other Ambulatory Visit: Payer: BC Managed Care – PPO

## 2021-02-22 LAB — CBC
HCT: 24.5 % — ABNORMAL LOW (ref 36.0–46.0)
Hemoglobin: 7.5 g/dL — ABNORMAL LOW (ref 12.0–15.0)
MCH: 29.5 pg (ref 26.0–34.0)
MCHC: 30.6 g/dL (ref 30.0–36.0)
MCV: 96.5 fL (ref 80.0–100.0)
Platelets: 304 10*3/uL (ref 150–400)
RBC: 2.54 MIL/uL — ABNORMAL LOW (ref 3.87–5.11)
RDW: 17.2 % — ABNORMAL HIGH (ref 11.5–15.5)
WBC: 4.9 10*3/uL (ref 4.0–10.5)
nRBC: 0 % (ref 0.0–0.2)

## 2021-02-22 LAB — CEA: CEA: 1.1 ng/mL (ref 0.0–4.7)

## 2021-02-22 MED ORDER — ALPRAZOLAM 0.25 MG PO TABS: 0.2500 mg | ORAL_TABLET | Freq: Two times a day (BID) | ORAL | 0 refills | Status: DC | PRN

## 2021-02-22 MED ORDER — TAB-A-VITE/IRON PO TABS
1.0000 | ORAL_TABLET | Freq: Every day | ORAL | Status: DC
  Administered 2021-02-22: 1 via ORAL
  Filled 2021-02-22: qty 1

## 2021-02-22 MED ORDER — ACETAMINOPHEN 325 MG PO TABS: 650.0000 mg | ORAL_TABLET | Freq: Four times a day (QID) | ORAL | Status: DC | PRN

## 2021-02-22 MED ORDER — ACETAMINOPHEN 325 MG PO TABS: 650.0000 mg | ORAL_TABLET | Freq: Four times a day (QID) | ORAL | Status: AC | PRN

## 2021-02-22 MED ORDER — TAB-A-VITE/IRON PO TABS: ORAL_TABLET | ORAL | 0 refills | Status: DC

## 2021-02-22 MED ORDER — OXYCODONE HCL 5 MG PO TABS: ORAL_TABLET | ORAL | 0 refills | Status: DC

## 2021-02-22 MED ORDER — OXYCODONE HCL 5 MG PO TABS: 5.0000 mg | ORAL_TABLET | ORAL | 0 refills | Status: DC | PRN

## 2021-02-22 MED ORDER — FERROUS SULFATE 325 (65 FE) MG PO TABS: 325.0000 mg | ORAL_TABLET | Freq: Two times a day (BID) | ORAL | 3 refills | Status: DC

## 2021-02-22 MED FILL — oxyCODONE HCL 5 MG TABS: 5 | 7 days supply | Qty: 30 | Fill #0

## 2021-02-22 MED FILL — ALPRAZolam 0.25 MG TABS: 0.25 | 8 days supply | Qty: 15 | Fill #0

## 2021-02-22 NOTE — Progress Notes (Signed)
Physical Therapy Treatment Patient Details Name: Paige Robertson MRN: [id number] DOB: [birth year] Today's Date: 02/22/2021    History of Present Illness Pt is 45 y.o. female who presents to the ED 3/14 for evaluation of a near syncopal episode. Hgb 7.6 on admission, dropped to 5.8 on 3/15  3/21 CT of abdomen revealed hypervascular mass in the right central pelvis measuring 10 cm. S/P resection of 10cm proximal jejunal mass. 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    PT Comments    Pt making good progress and has met or nearly met goals.  She has ambulated multiple times with family in hallway today.  She prefers to still use RW for comfort but demonstrated steady gait at normal speed.  Encouraged continued mobility with family.  No further skilled therapy indicated.     Follow Up Recommendations  No PT follow up;Supervision - Intermittent     Equipment Recommendations  None recommended by PT    Recommendations for Other Services       Precautions / Restrictions Precautions Precautions: None Other Brace: abdominal binder for OOB    Mobility  Bed Mobility Overal bed mobility: Independent                  Transfers Overall transfer level: Independent   Transfers: Sit to/from Stand           General transfer comment: Had supervision during therapy but performing independently  Ambulation/Gait Ambulation/Gait assistance: Modified independent (Device/Increase time) Gait Distance (Feet): 500 Feet Assistive device: Rolling walker (2 wheeled) Gait Pattern/deviations: Step-through pattern Gait velocity: normal   General Gait Details: Step through steady gait; pt reports feels better using RW; has been ambulating in hall with family   Stairs Stairs: Yes Stairs assistance: Supervision Stair Management: One rail Left;Step to pattern Number of Stairs: 3     Wheelchair  Mobility    Modified Rankin (Stroke Patients Only)       Balance Overall balance assessment: Modified Independent                                          Cognition Arousal/Alertness: Awake/alert Behavior During Therapy: WFL for tasks assessed/performed Overall Cognitive Status: Within Functional Limits for tasks assessed                                        Exercises      General Comments General comments (skin integrity, edema, etc.): VSS; pt donning abdominal binder independently; Discussed goals nearly met and no further skilled PT required. Acknowledged pt not back to her baseline but is ambulating with family , safe, and no longer required skilled therapy.  Encouraged continued mobility with family .  Has RW at home if needed.      Pertinent Vitals/Pain Pain Assessment: No/denies pain    Home Living                      Prior Function            PT Goals (current goals can now be found in the care plan section) Acute Rehab PT Goals Patient Stated Goal: go home as soon as possible PT Goal Formulation: All assessment and education complete, DC  therapy Progress towards PT goals: Goals met/education completed, patient discharged from PT    Frequency           PT Plan Discharge plan needs to be updated;Frequency needs to be updated (No further PT indicated)    Co-evaluation              AM-PAC PT "6 Clicks" Mobility   Outcome Measure  Help needed turning from your back to your side while in a flat bed without using bedrails?: None Help needed moving from lying on your back to sitting on the side of a flat bed without using bedrails?: None Help needed moving to and from a bed to a chair (including a wheelchair)?: None Help needed standing up from a chair using your arms (e.g., wheelchair or bedside chair)?: None Help needed to walk in hospital room?: A Little Help needed climbing 3-5 steps with a railing? :  A Little 6 Click Score: 22    End of Session   Activity Tolerance: Patient tolerated treatment well Patient left: in chair;with call bell/phone within reach;with family/visitor present Nurse Communication: Mobility status       Time: 1610-9604 PT Time Calculation (min) (ACUTE ONLY): 14 min  Charges:  $Gait Training: 8-22 mins                     Abran Richard, PT Acute Rehab Services Pager [phone] [facility] 02/22/2021, 3:35 PM

## 2021-02-22 NOTE — Progress Notes (Signed)
Pt given discharge instructions, prescriptions, and care notes. Pt verbalized understanding AEB no further questions or concerns at this time. Picc line was discontinued, no redness, pain, or swelling noted at this time. Telemetry discontinued and Centralized Telemetry was notified. Pt left the floor via wheelchair with staff in stable condition.

## 2021-02-22 NOTE — Discharge Summary (Addendum)
Physician Discharge Summary  Patient ID: Paige Robertson MRN: [id number] DOB/AGE: Aug 07, 1976 45 y.o.  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 Carol Ada Exploratory laparotomy, resection pelvic mass small bowel resection 02/18/2021, Dr. Mallie Darting Course:  Patient is a 45 year old female recently admitted 3/10-3/10/2021, with a GI bleed, angiodysplasia of the duodenum, with recurrent upper GI bleed from her AVMs.  She underwent argon laser treatment and clip placement at [facility].  She stabilized and was discharged home.  She has also had bleeds in 07/2020, 10/2020, 02/08/20 and now on 02/11/2021 she presented to the ED after becoming hot and dizzy. She had a syncopal episode at a softball game.  She presented to the ED here where she was found to be hypotensive blood pressure down as low as 84/56.  She was moderately tachycardic.  Labs showed hemoglobin of 6.1, hematocrit of 19.8, WBC 9.2, platelets 192,000.  CMP showed a chloride of 112, calcium 8.5, albumin 3.4, AST 39, ALT 61, total protein 5.8, total bilirubin 0.6.  Patient was transfused and seen in consultation by Dr. Carol Ada.  She underwent repeat EGD on 02/13/2021.  The esophagus and stomach were normal.  The duodenum was normal.  There was one nonbleeding superficial jejunal ulcer with no stigmata of bleeding the lesion was 10 mm in largest diameter and was treated with 4 hemostatic clips.  Dr. Benson Norway requested IR evaluation for CT angio and possible embolization.   This is the first CT she has had.   She reports a 10 pound weight loss since January, and a chronic vaginal discharge for some time.  She is not sure how long, but she wears pads daily for some time.     A CT abdomen pelvis with and without contrast scan was then obtained.  This showed a hypervascular mass in the right central pelvis measuring 10 cm.  There is early venous shunting associated with the soft tissue and venous engorgement of the mesenteric veins.  The soft tissue mass is unknown but a GIST tumor would be in the differential.  Soft tissue mass is difficult to separate from the uterus and the adnexal structures so a primary gynecologic malignancy cannot be excluded either.  No evidence for lymphadenopathy or peritoneal nodularity.  There is distention of the stomach, but no distention of the bowel, no evidence of SBO 4.2 cm low-density structure in the left adnexa could represent an ovarian cyst but was nonspecific.  EGD/small bowel enteroscopy 02/13/2021: - Normal esophagus. - Normal stomach. - Normal examined duodenum. - Non-bleeding jejunal ulcer with no stigmata of bleeding Clips (MR conditional) were placed. Injected. - A tattoo was seen in the jejunum. - No specimens collected.   We are asked to see the patient and after review of the CT and evaluation of the patient she underwent CT enterography of the abdomen the pelvis with contrast.  She also underwent GYN oncology evaluation by Dr. Jeral Pinch.  Was their opinion that this was not a GYN tumor.  She was also evaluated by Dr. Lahoma Crocker.  She was taken  the operating room on 02/18/2021 and underwent the above-noted procedure.  The findings included a large vascular mass arising from the antimesenteric border of the proximal jejunum with no significant abnormality noted of the uterus or the adnexa.  The cystic structure that appears along the adnexa was a portion of this mass.  Scattered areas of black speckling were seen involving the omentum and this  mass.    Patient tolerated the above-noted procedure well and was returned to the recovery room and then the floor in satisfactory condition.  As her bowel function returned her diet was advanced.  By 07/25/2021, patient was tolerating a soft.  She dropped her hemoglobin twice, initially, on admission on 02/12/2021 and received 2 units of packed cells.  She was also transfused again on 02/17/2021 again with 2 units of packed cells.  Her diet has been advanced she is tolerating soft diet.  He remains afebrile, systolic blood pressure stable in the 96-222 range diastolic blood pressure 97-98 range.  Bowel function returned this afternoon and she is ready for discharge.  Currently her H/H is 7.5/24.5, WBC 4.9, platelets 304,000. We are going to send her home with some Tylenol/oxycodone/Fe Sulfate/MVI with iron and some Xanax.  She can follow up with her PCP between now and when she sees oncology.  She has follow up for staple removal and to see Dr. Grandville Silos listed below.     CBC Latest Ref Rng & Units 02/22/2021 02/21/2021 02/20/2021  WBC 4.0 - 10.5 K/uL 4.9 5.5 11.4(H)  Hemoglobin 12.0 - 15.0 g/dL 7.5(L) 7.6(L) 8.2(L)  Hematocrit 36.0 - 46.0 % 24.5(L) 24.8(L) 25.9(L)  Platelets 150 - 400 K/uL 304 254 307    CMP Latest Ref Rng & Units 02/21/2021 02/20/2021 02/19/2021  Glucose 70 - 99 mg/dL 73 83 98  BUN 6 - 20 mg/dL _0 Creatinine 0.44 - 1.00 mg/dL 0.54 0.51 0.54  Sodium 135 - 145 mmol/L 137 134(L) 137  Potassium 3.5 - 5.1 mmol/L 3.4(L) 3.4(L) 3.8  Chloride 98 - 111 mmol/L 108 105 105  CO2 22 - 32 mmol/L 21(L) 22 28  Calcium 8.9 - 10.3 mg/dL 8.5(L) 8.0(L) 8.9  Total Protein 6.5 - 8.1 g/dL - - 4.8(L)  Total Bilirubin 0.3 - 1.2 mg/dL - - 0.9  Alkaline Phos 38 - 126 U/L - - 76  AST 15 - 41 U/L - - 23  ALT 0 - 44 U/L - - 27   Pathology: PATHOLOGY SURGICAL PATHOLOGY  CASE: [id number]  PATIENT: Paige Robertson  Surgical Pathology Report      Clinical History: Neat syncope (nt)     FINAL MICROSCOPIC DIAGNOSIS:   A. 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 COLON, MARGIN, EXCISION:   - Benign colon   ONCOLOGY TABLE:   GASTROINTESTINAL STROMAL TUMOR (GIST): Resection   Procedure: Resection  Tumor Focality: Unifocal  Multiple Primary Sites: Not applicable  Tumor Site: Jejunum  Tumor Size: 10.9 cm  Histologic Type: Gastrointestinal stromal tumor, spindle cell type  Mitotic Rate: 1 mitoses per 5 mm  Histologic Grade: G1, low-grade  Treatment Effect: No known presurgical therapy  Risk Assessment: High risk  Margins: All margins negative for tumor  Regional Lymph Nodes: Not applicable (no lymph nodes submitted or found)  Distant Metastasis:    Distant Site(s) Involved: N/A  Pathologic Stage Classification (pTNM, AJCC8th Edition): pT4, pN not  assigned  Ancillary Studies: Can be performed upon request  Representative Tumor Block: A6  Comment(s): By immunohistochemistry, the neoplastic cells are positive  for CD117 but negative for CD34. The proliferative rate by Ki-67 is low  (less than 1%). Dr. Vic Ripper reviewed the case and agrees with the  above diagnosis.    CT scan 02/13/21: 1. Hypervascular mass in the right central pelvis, measuring up to 10.0 cm. There is early venous shunting associated with this soft tissue mass and there is venous engorgement of the mesenteric veins. The etiology for the soft tissue mass is uncertain but a GIST would be in the differential diagnosis. Soft tissue mass is difficult to separate from the uterus and adnexal structures and a primary gynecologic malignancy cannot be completely excluded. No evidence for lymphadenopathy or peritoneal nodularity. Recommend surgical oncology consultation. 2. Distension of the stomach.  No evidence for a bowel obstruction. 3. 4.2 cm low-density structure in the left  adnexa region could represent an ovarian cyst but nonspecific.   CT Enterography abdomen/pelvis 02/15/21 1. 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 An angulated loop of jejunum extends down to the pelvis to the margin of this mass, and in sharply angles back up into the upper, and accordingly may be tethered to this mass. The hemoclips in the jejunum are immediately along the angulated inferior margin of this loop of jejunum, at the upper edge of the mass. Possibilities may include carcinoid, GI stromal tumor, other jejunal tumors, or a mass arising from the adjacent uterus/right ovary (although the tumor is not highly characteristic of a fibroid or typical ovarian neoplasm). Tissue diagnosis is recommended. 2. Mild reticulonodular opacity in the lingula favoring atypical or early infectious process. 3. Nonspecific 3.0 by 2.7 cm cystic lesion along the left side of the mass and adjacent to the left adnexa, but probably not arising from the ovary. 4. Mild levoconvex lumbar scoliosis. 5. Small sclerotic lesion posteriorly in the L4 vertebral body is probably benign.   Condition on discharge:  Improved  Disposition:  Discharge disposition: 01-Home or Self Care        Allergies as of 02/22/2021      Reactions   Feraheme [ferumoxytol] Anaphylaxis   Sulfa Antibiotics Other (See Comments)   Childhood allergy, unknown reaction      Medication List    STOP taking these medications   pantoprazole 40 MG tablet Commonly known as: Protonix     TAKE these medications   acetaminophen 325 MG tablet Commonly known as: TYLENOL Take 2 tablets (650 mg total) by mouth every 6 (six) hours as needed for mild pain, moderate pain, fever or headache.   ALPRAZolam 0.25 MG tablet Commonly known as: XANAX Take 1 tablet (0.25 mg total) by mouth 2 (two) times daily as needed for anxiety.    cetirizine 10 MG tablet Commonly known as: ZYRTEC Take 10 mg by mouth daily.   cyanocobalamin 1000 MCG tablet Take 1 tablet (1,000 mcg total) by mouth daily. Can get any over-the-counter Vitamin B-12 supplement.   ferrous sulfate 325 (65 FE) MG tablet Take 1 tablet (325 mg total) by mouth 2 (two) times daily with a meal. Can get any over-the-counter iron supplement. What changed: when to take this   multivitamins with iron Tabs tablet Get a Woman's one a day multivitamin with iron and use in addition to Iron tablets.   oxyCODONE 5 MG immediate release tablet  Commonly known as: Oxy IR/ROXICODONE You can take 1-2 tablets every 6 hours as needed for pain not relieved by Tylenol/acetaminophen/       Follow-up Information    [facility], [place] Follow up.   Specialty: General Surgery Why: Your first appointment is with the nurse at 2:30 PM for staple removal.  Be at the office 30 minutes early for check in.  Bring photo ID and insurance information.   Contact information: [address] [place] [address] [phone]        Georganna Skeans, MD Follow up on 03/08/2021.   Specialty: General Surgery Why: Your appointment is at 3:45 PM.  Be at the office 30 minutes early for check in.  Bring photo ID and insurance information with you. Contact information: [address] [place] [address] [phone]        Gladstone Lighter, MD Follow up.   Specialty: Internal Medicine Why: call and let them know what has happened and see them for follow up in a couple weeks.  I will send them the discharge summary also. Contact information: [place] [address] [phone]               Signed: Earnstine Regal 02/22/2021, 4:49 PM

## 2021-02-22 NOTE — Discharge Instructions (Signed)
[facility], [place] [phone]  OPEN ABDOMINAL SURGERY: POST OP INSTRUCTIONS  Always review your discharge instruction sheet given to you by the facility where your surgery was performed.  IF YOU HAVE DISABILITY OR FAMILY LEAVE FORMS, YOU MUST BRING THEM TO THE OFFICE FOR PROCESSING.  PLEASE DO NOT GIVE THEM TO YOUR DOCTOR.  1. A prescription for pain medication may be given to you upon discharge.  Take your pain medication as prescribed, if needed.  If narcotic pain medicine is not needed, then you may take acetaminophen (Tylenol) or ibuprofen (Advil) as needed. 2. Take your usually prescribed medications unless otherwise directed. 3. If you need a refill on your pain medication, please contact your pharmacy. They will contact our office to request authorization.  Prescriptions will not be filled after 5pm or on week-ends. 4. You should follow a light diet the first few days after arrival home, such as soup and crackers, pudding, etc.unless your doctor has advised otherwise. A high-fiber, low fat diet can be resumed as tolerated.   Be sure to include lots of fluids daily. Most patients will experience some swelling and bruising on the chest and neck area.  Ice packs will help.  Swelling and bruising can take several days to resolve 5. Most patients will experience some swelling and bruising in the area of the incision. Ice pack will help. Swelling and bruising can take several days to resolve..  6. It is common to experience some constipation if taking pain medication after surgery.  Increasing fluid intake and taking a stool softener will usually help or prevent this problem from occurring.  A mild laxative (Milk of Magnesia or Miralax) should be taken according to package directions if there are no bowel movements after 48 hours. 7.  You may have steri-strips (small skin tapes) in place directly over the incision.  These strips should be left on the skin for 7-10 days.  If  your surgeon used skin glue on the incision, you may shower in 24 hours.  The glue will flake off over the next 2-3 weeks.  Any sutures or staples will be removed at the office during your follow-up visit. You may find that a light gauze bandage over your incision may keep your staples from being rubbed or pulled. You may shower and replace the bandage daily. 8. ACTIVITIES:  You may resume regular (light) daily activities beginning the next day--such as daily self-care, walking, climbing stairs--gradually increasing activities as tolerated.  You may have sexual intercourse when it is comfortable.  Refrain from any heavy lifting or straining until approved by your doctor. a. You may drive when you no longer are taking prescription pain medication, you can comfortably wear a seatbelt, and you can safely maneuver your car and apply brakes b. Return to Work: ___________________________________ 4. You should see your doctor in the office for a follow-up appointment approximately two weeks after your surgery.  Make sure that you call for this appointment within a day or two after you arrive home to insure a convenient appointment time. OTHER INSTRUCTIONS:  _____________________________________________________________ _____________________________________________________________  WHEN TO CALL YOUR DOCTOR: 1. Fever over 101.0 2. Inability to urinate 3. Nausea and/or vomiting 4. Extreme swelling or bruising 5. Continued bleeding from incision. 6. Increased pain, redness, or drainage from the incision. 7. Difficulty swallowing or breathing 8. Muscle cramping or spasms. 9. Numbness or tingling in hands or feet or around lips.  The clinic staff is  available to answer your questions during regular business hours.  Please don't hesitate to call and ask to speak to one of the nurses if you have concerns.  For further questions, please visit [email or web address]     Soft-Food Eating Plan Stay  on this diet for 2-3 weeks, then when you are feeling better convert to high fiber diet. A soft-food eating plan includes foods that are safe and easy to chew and swallow. Your health care provider or dietitian can help you find foods and flavors that fit into this plan. Follow this plan until your health care provider or dietitian says it is safe to start eating other foods and food textures. What are tips for following this plan? General guidelines  Take small bites of food, or cut food into pieces about  inch or smaller. Bite-sized pieces of food are easier to chew and swallow.  Eat moist foods. Avoid overly dry foods.  Avoid foods that: ? Are difficult to swallow, such as dry, chunky, crispy, or sticky foods. ? Are difficult to chew, such as hard, tough, or stringy foods. ? Contain nuts, seeds, or fruits.  Follow instructions from your dietitian about the types of liquids that are safe for you to swallow. You may be allowed to have: ? Thick liquids only. This includes only liquids that are thicker than honey. ? Thin and thick liquids. This includes all beverages and foods that become liquid at room temperature.  To make thick liquids: ? Purchase a commercial liquid thickening powder. These are available at grocery stores and pharmacies. ? Mix the thickener into liquids according to instructions on the label. ? Purchase ready-made thickened liquids. ? Thicken soup by pureeing, straining to remove chunks, and adding flour, potato flakes, or corn starch. ? Add commercial thickener to foods that become liquid at room temperature, such as milk shakes, yogurt, ice cream, gelatin, and sherbet.  Ask your health care provider whether you need to take a fiber supplement.   Cooking  Cook meats so they stay tender and moist. Use methods like braising, stewing, or baking in liquid.  Cook vegetables and fruit until they are soft enough to be mashed with a fork.  Peel soft, fresh fruits such as  peaches, nectarines, and melons.  When making soup, make sure chunks of meat and vegetables are smaller than  inch.  Reheat leftover foods slowly so that a tough crust does not form. What foods are allowed? The items listed below may not be a complete list. Talk with your dietitian about what dietary choices are best for you. Grains Breads, muffins, pancakes, or waffles moistened with syrup, jelly, or butter. Dry cereals well-moistened with milk. Moist, cooked cereals. Well-cooked pasta and rice. Vegetables All soft-cooked vegetables. Shredded lettuce. Fruits All canned and cooked fruits. Soft, peeled fresh fruits. Strawberries. Dairy Milk. Cream. Yogurt. Cottage cheese. Soft cheese without the rind. Meats and other protein foods Tender, moist ground meat, poultry, or fish. Meat cooked in gravy or sauces. Eggs. Sweets and desserts Ice cream. Milk shakes. Sherbet. Pudding. Fats and oils Butter. Margarine. Olive, canola, sunflower, and grapeseed oil. Smooth salad dressing. Smooth cream cheese. Mayonnaise. Gravy. What foods are not allowed? The items listed bemay not be a complete list. Talk with your dietitian about what dietary choices are best for you. Grains Coarse or dry cereals, such as bran, granola, and shredded wheat. Tough or chewy crusty breads, such as [place] bread or baguettes. Breads with nuts, seeds, or fruit. Vegetables All raw vegetables.  Cooked corn. Cooked vegetables that are tough or stringy. Tough, crisp, fried potatoes and potato skins. Fruits Fresh fruits with skins or seeds, or both, such as apples, pears, and grapes. Stringy, high-pulp fruits, such as papaya, pineapple, coconut, and mango. Fruit leather and all dried fruit. Dairy Yogurt with nuts or coconut. Meats and other protein foods Hard, dry sausages. Dry meat, poultry, or fish. Meats with gristle. Fish with bones. Fried meat or fish. Lunch meat and hotdogs. Nuts and seeds. Chunky peanut butter or other  nut butters. Sweets and desserts Cakes or cookies that are very dry or chewy. Desserts with dried fruit, nuts, or coconut. Fried pastries. Very rich pastries. Fats and oils Cream cheese with fruit or nuts. Salad dressings with seeds or chunks. Summary  A soft-food eating plan includes foods that are safe and easy to swallow. Generally, the foods should be soft enough to be mashed with a fork.  Avoid foods that are dry, hard to chew, crunchy, sticky, stringy, or crispy.  Ask your health care provider whether you need to thicken your liquids and if you need to take a fiber supplement. This information is not intended to replace advice given to you by your health care provider. Make sure you discuss any questions you have with your health care provider. Document Revised: 03/10/2019 Document Reviewed: 01/20/2017 Elsevier Patient Education  2021 [place].    High-Fiber Eating Plan Fiber, also called dietary fiber, is a type of carbohydrate. It is found foods such as fruits, vegetables, whole grains, and beans. A high-fiber diet can have many health benefits. Your health care provider may recommend a high-fiber diet to help:  Prevent constipation. Fiber can make your bowel movements more regular.  Lower your cholesterol.  Relieve the following conditions: ? Inflammation of veins in the anus (hemorrhoids). ? Inflammation of specific areas of the digestive tract (uncomplicated diverticulosis). ? A problem of the large intestine, also called the colon, that sometimes causes pain and diarrhea (irritable bowel syndrome, or IBS).  Prevent overeating as part of a weight-loss plan.  Prevent heart disease, type 2 diabetes, and certain cancers. What are tips for following this plan? Reading food labels  Check the nutrition facts label on food products for the amount of dietary fiber. Choose foods that have 5 grams of fiber or more per serving.  The goals for recommended daily fiber intake  include: ? Men (age 34 or younger): 34-38 g. ? Men (over age 65): 28-34 g. ? Women (age 36 or younger): 25-28 g. ? Women (over age 75): 22-25 g. Your daily fiber goal is _____________ g.   Shopping  Choose whole fruits and vegetables instead of processed forms, such as apple juice or applesauce.  Choose a wide variety of high-fiber foods such as avocados, lentils, oats, and kidney beans.  Read the nutrition facts label of the foods you choose. Be aware of foods with added fiber. These foods often have high sugar and sodium amounts per serving. Cooking  Use whole-grain flour for baking and cooking.  Cook with brown rice instead of white rice. Meal planning  Start the day with a breakfast that is high in fiber, such as a cereal that contains 5 g of fiber or more per serving.  Eat breads and cereals that are made with whole-grain flour instead of refined flour or white flour.  Eat brown rice, bulgur wheat, or millet instead of white rice.  Use beans in place of meat in soups, salads, and pasta dishes.  Be sure that half of the grains you eat each day are whole grains. General information  You can get the recommended daily intake of dietary fiber by: ? Eating a variety of fruits, vegetables, grains, nuts, and beans. ? Taking a fiber supplement if you are not able to take in enough fiber in your diet. It is better to get fiber through food than from a supplement.  Gradually increase how much fiber you consume. If you increase your intake of dietary fiber too quickly, you may have bloating, cramping, or gas.  Drink plenty of water to help you digest fiber.  Choose high-fiber snacks, such as berries, raw vegetables, nuts, and popcorn. What foods should I eat? Fruits Berries. Pears. Apples. Oranges. Avocado. Prunes and raisins. Dried figs. Vegetables Sweet potatoes. Spinach. Kale. Artichokes. Cabbage. Broccoli. Cauliflower. Green peas. Carrots. Squash. Grains Whole-grain breads.  Multigrain cereal. Oats and oatmeal. Brown rice. Barley. Bulgur wheat. [place]. Quinoa. Bran muffins. Popcorn. Rye wafer crackers. Meats and other proteins Navy beans, kidney beans, and pinto beans. Soybeans. Split peas. Lentils. Nuts and seeds. Dairy Fiber-fortified yogurt. Beverages Fiber-fortified soy milk. Fiber-fortified orange juice. Other foods Fiber bars. The items listed above may not be a complete list of recommended foods and beverages. Contact a dietitian for more information. What foods should I avoid? Fruits Fruit juice. Cooked, strained fruit. Vegetables Fried potatoes. Canned vegetables. Well-cooked vegetables. Grains White bread. Pasta made with refined flour. White rice. Meats and other proteins Fatty cuts of meat. Fried chicken or fried fish. Dairy Milk. Yogurt. Cream cheese. Sour cream. Fats and oils Butters. Beverages Soft drinks. Other foods Cakes and pastries. The items listed above may not be a complete list of foods and beverages to avoid. Talk with your dietitian about what choices are best for you. Summary  Fiber is a type of carbohydrate. It is found in foods such as fruits, vegetables, whole grains, and beans.  A high-fiber diet has many benefits. It can help to prevent constipation, lower blood cholesterol, aid weight loss, and reduce your risk of heart disease, diabetes, and certain cancers.  Increase your intake of fiber gradually. Increasing fiber too quickly may cause cramping, bloating, and gas. Drink plenty of water while you increase the amount of fiber you consume.  The best sources of fiber include whole fruits and vegetables, whole grains, nuts, seeds, and beans. This information is not intended to replace advice given to you by your health care provider. Make sure you discuss any questions you have with your health care provider. Document Revised: 03/22/2020 Document Reviewed: 03/22/2020 Elsevier Patient Education  2021 [employer].

## 2021-02-22 NOTE — Progress Notes (Signed)
4 Days Post-Op   Subjective: Tolerating fulls A lot of flatus No BM ROS negative except as listed above. Objective: Vital signs in last 24 hours: Temp:  [98 F (36.7 C)-98.9 F (37.2 C)] 98 F (36.7 C) (03/25 0425) Pulse Rate:  [74-95] 74 (03/25 0425) Resp:  [15-20] 20 (03/25 0425) BP: (93-103)/(56-59) 93/58 (03/25 0425) SpO2:  [95 %-98 %] 97 % (03/25 0425) Last BM Date:  (pta)  Intake/Output from previous day: 03/24 0701 - 03/25 0700 In: 130 [P.O.:120; I.V.:10] Out: -  Intake/Output this shift: No intake/output data recorded.  General appearance: cooperative Resp: clear to auscultation bilaterally Cardio: regular rate and rhythm GI: soft, incision CDI  Lab Results: CBC  Recent Labs    02/21/21 0408 02/22/21 0324  WBC 5.5 4.9  HGB 7.6* 7.5*  HCT 24.8* 24.5*  PLT 254 304   BMET Recent Labs    02/20/21 0905 02/21/21 0408  NA 134* 137  K 3.4* 3.4*  CL 105 108  CO2 22 21*  GLUCOSE 83 73  BUN 10 10  CREATININE 0.51 0.54  CALCIUM 8.0* 8.5*   PT/INR No results for input(s): LABPROT, INR in the last 72 hours. ABG No results for input(s): PHART, HCO3 in the last 72 hours.  Invalid input(s): PCO2, PO2  Studies/Results: No results found.  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 Grandville Silos - surgical path shows GIST - soft diet - Mobilize, continue PT   ABL anemia - Hgb 7.5, no melena  FEN: SL,  soft diet ID: cefotetan periop DVT: SCDs, LMWH Foley: out Dispo - home likely this PM Plan Oncology eval as outpatient to eval need for Gleevec.  LOS: 10 days    Georganna Skeans, MD, MPH, FACS [facility] Use [email or web address] to contact on call provider  3/25/2022Patient ID: Paige Robertson, female   DOB: [birth year], 45 y.o.   MRN: [id number]

## 2021-02-25 ENCOUNTER — Telehealth: Payer: BC Managed Care – PPO | Admitting: Oncology

## 2021-03-05 ENCOUNTER — Telehealth: Payer: Self-pay | Admitting: Oncology

## 2021-03-05 NOTE — Telephone Encounter (Signed)
Received a new pt referral from Dr. Marcello Moores at [facility] for dx: GIST. Ms. Paige Robertson has been cld and scheduled to see Dr. Benay Spice on 4/21 at 9am. Pt aware to arrive 20 minutes early. I also gave the address to the [place] location.

## 2021-03-11 ENCOUNTER — Ambulatory Visit: Payer: BC Managed Care – PPO | Admitting: Gastroenterology

## 2021-03-20 ENCOUNTER — Encounter: Payer: Self-pay | Admitting: *Deleted

## 2021-03-20 ENCOUNTER — Telehealth: Payer: Self-pay | Admitting: *Deleted

## 2021-03-20 NOTE — Telephone Encounter (Signed)
Spoke with patient to review her medical history, pharmacy, meds and allergies. She will bring her vaccine card to appointment tomorrow. Advised her to arrive at 0945 for 10:00 appointment and she is allowed to bring one visitor to appointment with her as well as wear a mask. Informed her that appointment will last ~ 1 hour. Gave her directions as to location and where to park.

## 2021-03-21 ENCOUNTER — Other Ambulatory Visit: Payer: Self-pay

## 2021-03-21 ENCOUNTER — Telehealth: Payer: Self-pay

## 2021-03-21 ENCOUNTER — Inpatient Hospital Stay: Payer: BC Managed Care – PPO | Attending: Oncology | Admitting: Oncology

## 2021-03-21 ENCOUNTER — Inpatient Hospital Stay: Payer: BC Managed Care – PPO

## 2021-03-21 VITALS — BP 93/61 | HR 67 | Temp 98.1°F | Resp 18 | Ht 62.0 in | Wt 129.8 lb

## 2021-03-21 DIAGNOSIS — C49A3 Gastrointestinal stromal tumor of small intestine: Secondary | ICD-10-CM | POA: Diagnosis not present

## 2021-03-21 DIAGNOSIS — D509 Iron deficiency anemia, unspecified: Secondary | ICD-10-CM

## 2021-03-21 DIAGNOSIS — D649 Anemia, unspecified: Secondary | ICD-10-CM | POA: Insufficient documentation

## 2021-03-21 LAB — CBC WITH DIFFERENTIAL (CANCER CENTER ONLY)
Abs Immature Granulocytes: 0 10*3/uL (ref 0.00–0.07)
Basophils Absolute: 0 10*3/uL (ref 0.0–0.1)
Basophils Relative: 1 %
Eosinophils Absolute: 0.1 10*3/uL (ref 0.0–0.5)
Eosinophils Relative: 3 %
HCT: 39.8 % (ref 36.0–46.0)
Hemoglobin: 12.4 g/dL (ref 12.0–15.0)
Immature Granulocytes: 0 %
Lymphocytes Relative: 29 %
Lymphs Abs: 1.4 10*3/uL (ref 0.7–4.0)
MCH: 30.6 pg (ref 26.0–34.0)
MCHC: 31.2 g/dL (ref 30.0–36.0)
MCV: 98.3 fL (ref 80.0–100.0)
Monocytes Absolute: 0.3 10*3/uL (ref 0.1–1.0)
Monocytes Relative: 5 %
Neutro Abs: 2.9 10*3/uL (ref 1.7–7.7)
Neutrophils Relative %: 62 %
Platelet Count: 228 10*3/uL (ref 150–400)
RBC: 4.05 MIL/uL (ref 3.87–5.11)
RDW: 14.3 % (ref 11.5–15.5)
WBC Count: 4.7 10*3/uL (ref 4.0–10.5)
nRBC: 0 % (ref 0.0–0.2)

## 2021-03-21 LAB — CMP (CANCER CENTER ONLY)
ALT: 37 U/L (ref 0–44)
AST: 23 U/L (ref 15–41)
Albumin: 4.7 g/dL (ref 3.5–5.0)
Alkaline Phosphatase: 106 U/L (ref 38–126)
Anion gap: 7 (ref 5–15)
BUN: 9 mg/dL (ref 6–20)
CO2: 28 mmol/L (ref 22–32)
Calcium: 10.2 mg/dL (ref 8.9–10.3)
Chloride: 104 mmol/L (ref 98–111)
Creatinine: 0.5 mg/dL (ref 0.44–1.00)
GFR, Estimated: >60 mL/min (ref >60)
Glucose, Bld: 88 mg/dL (ref 70–99)
Potassium: 4.6 mmol/L (ref 3.5–5.1)
Sodium: 139 mmol/L (ref 135–145)
Total Bilirubin: 0.3 mg/dL (ref 0.3–1.2)
Total Protein: 7.5 g/dL (ref 6.5–8.1)

## 2021-03-21 MED ORDER — IMATINIB MESYLATE 400 MG PO TABS
400.0000 mg | ORAL_TABLET | Freq: Every day | ORAL | 0 refills | Status: DC
  Filled 2021-03-21: qty 30, 30d supply, fill #0

## 2021-03-21 NOTE — Progress Notes (Signed)
[facility] New Patient Consult   Requesting MD: Gladstone Lighter, [place],  [place] [address]   Paige Robertson 45 y.o.  04/15/1976    Reason for Consult: Gastrointestinal stromal tumor   HPI: Ms. Menton initially presented with severe microcytic anemia in February 2021.  She was transfused with packed red blood cells.  She was noted to have iron deficiency.  She reports undergoing negative upper and lower endoscopic evaluation. She was treated with IV iron sucrose.  She was admitted on 07/06/2020 with a syncope event and melena.  She underwent an upper endoscopy that revealed nonbleeding angiodysplastic lesions in the stomach and duodenum treated with APC.  She also underwent a capsule endoscopy that revealed no source for bleeding.  Small bowel endoscopy was normal. She continues to receive outpatient IV iron and underwent a repeat upper endoscopy 11/19/2020 which revealed gastric antral vascular ectasia without bleeding.  Multiple nonbleeding angioectasias were noted in the duodenum.  She was treated with APC.  She remained anemic.  She presented to the emergency room with presyncope symptoms and hypotension on 02/07/2021.  An endoscopy revealed bleeding angiodysplastic lesions in the jejunum treated with APC.  She had a reaction to [facility] while in the hospital.  She was discharged to home 02/09/2021. Which dropped to 5.8 the next morning.  She presented to the emergency room on 02/11/2021 after a syncope event.  She was noted to have a hemoglobin of 7.6.  She was transferred to [facility] for further evaluation.  A small bowel enteroscopy by Dr. Benson Norway on 02/12/2021 revealed a nonbleeding jejunal ulcer with no stigmata of bleeding.   A CT angiogram of the abdomen and pelvis on 02/13/2021 revealed a distended stomach with a large amount of fluid.  Endoscopic clips are noted in the jejunum.  These clips are adjacent to a large mass in the pelvis.  Small bowel  loops could not be distinguished from the pelvic mass.  No adenopathy.  A 4.2 cm low-density structure in the region of the left adnexa.  The uterus is displaced towards the left.  A peripheral enhancing low-density structure in the right adnexa is suggestive of a corpus luteum.  An avidly enhancing heterogenous mass is noted in the right central aspect of the pelvis measuring 10 x 8 x 9 cm.  No ascites.  No omental or peritoneal nodularity. A CT enterography on 02/15/2021 revealed a mass in the right hemipelvis with clips along bowel at the upper margin of the mass.  There was a suggested the jejunal wall is tethered to the mass.  The mass is closely associated with the right adnexa and uterus.  No dilated bowel. Dr. Grandville Silos was consulted and she was taken to the operating room on 02/18/2021 for resection of the pelvic mass and a small bowel resection.  A large vascular mass was noted to arise from the antimesenteric border of the proximal jejunum.  No abnormality of the uterus or adnexa.  The cystic structure at the adnexa on CT was a portion of the mass.  No other mass.  No adenopathy.  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  The resection margins returned negative.  The tumor returned low-grade with 1 mitosis per 5 mm.  The tumor cells stain positive for CD117 and negative for CD34.  The Ki67 returned at less than 1%.  Past Medical History:  Diagnosis Date  . Acid reflux   . Hematuria   .  Hyperlipemia   . IDA (iron deficiency anemia)   . Melena   . Perimenopausal symptoms   . Vaginal discharge     .  G2 P2, she continues to have menses   .  Childhood asthma  Past Surgical History:  Procedure Laterality Date  . BOWEL RESECTION N/A 02/18/2021   Procedure: SMALL BOWEL RESECTION;  Surgeon: Georganna Skeans, MD;  Location: [place];  Service: General;  Laterality: N/A;  . COLONOSCOPY WITH ESOPHAGOGASTRODUODENOSCOPY (EGD)    . ENTEROSCOPY N/A  07/09/2020   Procedure: ENTEROSCOPY;  Surgeon: Lin Landsman, MD;  Location: [facility] ENDOSCOPY;  Service: Gastroenterology;  Laterality: N/A;  . ENTEROSCOPY N/A 02/07/2021   Procedure: ENTEROSCOPY;  Surgeon: Lin Landsman, MD;  Location: [facility] ENDOSCOPY;  Service: Gastroenterology;  Laterality: N/A;  . ENTEROSCOPY N/A 02/13/2021   Procedure: ENTEROSCOPY;  Surgeon: Carol Ada, MD;  Location: [facility] ENDOSCOPY;  Service: Endoscopy;  Laterality: N/A;  . ESOPHAGOGASTRODUODENOSCOPY (EGD) WITH PROPOFOL N/A 07/06/2020   Procedure: ESOPHAGOGASTRODUODENOSCOPY (EGD) WITH PROPOFOL;  Surgeon: Lucilla Lame, MD;  Location: [facility] ENDOSCOPY;  Service: Endoscopy;  Laterality: N/A;  . ESOPHAGOGASTRODUODENOSCOPY (EGD) WITH PROPOFOL N/A 11/19/2020   Procedure: ESOPHAGOGASTRODUODENOSCOPY (EGD) WITH PROPOFOL;  Surgeon: Toledo, Benay Pike, MD;  Location: [facility] ENDOSCOPY;  Service: Gastroenterology;  Laterality: N/A;  . GIVENS CAPSULE STUDY N/A 07/08/2020   Procedure: GIVENS CAPSULE STUDY;  Surgeon: Virgel Manifold, MD;  Location: [facility] ENDOSCOPY;  Service: Endoscopy;  Laterality: N/A;  Administer between 6-8 pm today  . HEMOSTASIS CLIP PLACEMENT  02/13/2021   Procedure: HEMOSTASIS CLIP PLACEMENT;  Surgeon: Carol Ada, MD;  Location: [place];  Service: Endoscopy;;  . LAPAROTOMY N/A 02/18/2021   Procedure: RESECTION PELVIC MASS;  Surgeon: Georganna Skeans, MD;  Location: [place];  Service: General;  Laterality: N/A;  . NO PAST SURGERIES    . SCLEROTHERAPY  02/13/2021   Procedure: Clide Deutscher;  Surgeon: Carol Ada, MD;  Location: [facility] ENDOSCOPY;  Service: Endoscopy;;    Medications: Reviewed  Allergies:  Allergies  Allergen Reactions  . Feraheme [Ferumoxytol] Anaphylaxis  . Sulfa Antibiotics Other (See Comments)    Childhood allergy, unknown reaction    Family history: A maternal grandmother, paternal grandmother, and paternal aunt had breast cancer.  Social History:   She lives with her husband and 2  daughters in [place].  She works in the Insurance underwriter at [employer].  She does not use cigarettes.  Rare alcohol use.  She has received multiple red cell transfusions over the past year.  No risk factor for HIV or hepatitis.  ROS:   Positives include: Bowel movements every other day following surgery, abdominal soreness from surgery  A complete ROS was otherwise negative.  Physical Exam:  Blood pressure 93/61, pulse 67, temperature 98.1 F (36.7 C), temperature source Tympanic, resp. rate (!) 98, height 5' 2"  (1.575 m), weight 129 lb 12.8 oz (58.9 kg), SpO2 100 %.  HEENT: Neck without mass Lungs: Clear bilaterally Cardiac: Regular rate and rhythm Abdomen: No hepatosplenomegaly, no mass, healed midline incision, mild diffuse tenderness  Vascular: No leg edema Lymph nodes: No cervical, supraclavicular, axillary, or inguinal nodes Neurologic: The motor exam is intact in the upper and lower extremities bilaterally Skin: No rash Musculoskeletal: No spine tenderness   LAB:  CBC  Lab Results  Component Value Date   WBC 4.7 03/21/2021   HGB 12.4 03/21/2021   HCT 39.8 03/21/2021   MCV 98.3 03/21/2021   PLT 228 03/21/2021   NEUTROABS 2.9 03/21/2021  CMP  Lab Results  Component Value Date   NA 139 03/21/2021   K 4.6 03/21/2021   CL 104 03/21/2021   CO2 28 03/21/2021   GLUCOSE 88 03/21/2021   BUN 9 03/21/2021   CREATININE 0.50 03/21/2021   CALCIUM 10.2 03/21/2021   PROT 7.5 03/21/2021   ALBUMIN 4.7 03/21/2021   AST 23 03/21/2021   ALT 37 03/21/2021   ALKPHOS 106 03/21/2021   BILITOT 0.3 03/21/2021   GFRNONAA >60 03/21/2021   GFRAA >60 07/09/2020     Lab Results  Component Value Date   CEA1 0.7 02/18/2021    Imaging: As per HPI-CT images from 02/13/2021 reviewed with Ms. Longfield and her husband  Assessment/Plan:   1. Gastrointestinal stromal tumor of the jejunum  Jejunal mass/small bowel resection 02/18/2021- 10.9 cm tumor, 1 mitosis  per 5 mm, Ki67-less than 1%, CD117 positive, pT4pNx  2. History of anemia and iron deficiency secondary to bleeding from #1 3. Multiple hospital admissions with symptomatic anemia 4. 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 Mclaurin has been diagnosed with a gastrointestinal stromal tumor of the jejunum.  We reviewed the surgical pathology report, prognosis, and adjuvant treatment options.  Her tumor falls into the "high risk "category based on location and tumor size.  There is a significant chance of developing recurrence of the gastrointestinal stromal tumor over the next several years.  We discussed data supporting the use of adjuvant imatinib in this setting.  I recommend imatinib for at least 3 years.  We reviewed potential toxicities associated with imatinib including the chance of diarrhea, edema, rash, hematologic toxicity, and cardiac toxicity.  She agrees to proceed.  She will be contacted by the Cancer center pharmacist for additional teaching and to arrange for drug delivery.  I anticipate she will begin imatinib on 03/27/2021.  The anemia has corrected with removal of the jejunal tumor.  She will discontinue iron therapy.  We will submit the tumor for c-kit mutation testing.  Ms. Bai will return for an office visit approximately 2 weeks after starting imatinib.  [name], MD  03/21/2021, 1:35 PM

## 2021-03-21 NOTE — Telephone Encounter (Signed)
TC to Pt informed her of normal labs, and ok to stop ferrous sulfate.Pt verbalized understanding. No further problems or concerns noted.

## 2021-03-21 NOTE — Telephone Encounter (Signed)
-----   Message from Ladell Pier, MD sent at 03/21/2021  1:04 PM EDT ----- Please call patient, hemoglobin is normal, okay to stop the ferrous sulfate

## 2021-03-22 ENCOUNTER — Telehealth: Payer: Self-pay | Admitting: Pharmacist

## 2021-03-22 ENCOUNTER — Other Ambulatory Visit: Payer: Self-pay | Admitting: *Deleted

## 2021-03-22 ENCOUNTER — Other Ambulatory Visit (HOSPITAL_COMMUNITY): Payer: Self-pay

## 2021-03-22 DIAGNOSIS — C49A3 Gastrointestinal stromal tumor of small intestine: Secondary | ICD-10-CM

## 2021-03-22 MED ORDER — IMATINIB MESYLATE 400 MG PO TABS: 400.0000 mg | ORAL_TABLET | Freq: Every day | ORAL | 0 refills | Status: DC

## 2021-03-22 MED ORDER — ONDANSETRON HCL 8 MG PO TABS: 8.0000 mg | ORAL_TABLET | Freq: Three times a day (TID) | ORAL | 1 refills | Status: DC | PRN

## 2021-03-22 NOTE — Telephone Encounter (Signed)
Oral Oncology Pharmacist Encounter  Received new prescription for Gleevec (imatinib) for the adjuvant treatment of GIST, planned duration 3 years.  Prescription dose and frequency assessed for appropriateness. Appropriate for therapy initiation.   CBC w/ Diff and CMP from 03/21/21 assessed, labs OK for treatment initiation.  Current medication list in Epic reviewed, DDIs with Gleevec identified:  Category D DDI between [place] and alprazolam - Gleevec, a CYP3A4 inhibitor may increase serum concentrations of alprazolam, recommend pt monitoring for increased SE including but not limited to lethargy. Noted patient only takes alprazolam PRN.  Category C DDI between [place] and oxycodone - Gleevec a CYP3A4 inhibitor may increase serum concentrations of oxycodone - noted patient taking PRN, but recommend monitoring for increased sedation/lethargy.  Category C DDI between [place] and acetaminophen due to risk of increased hepatotoxic effects. Patient only reports taking PRN and LFTs are WNL. No changes required.   Evaluated chart and no patient barriers to medication adherence noted.   Patient agreement for treatment documented in MD note on 03/21/21.  Prescription has been e-scribed to the [facility] for benefits analysis and approval.  Oral Oncology Clinic will continue to follow for insurance authorization, copayment issues, initial counseling and start date.  Leron Croak, PharmD, BCPS Hematology/Oncology Clinical Pharmacist [facility] [phone] 03/22/2021 9:02 AM

## 2021-03-22 NOTE — Telephone Encounter (Signed)
Oral Oncology Pharmacist Encounter   Prior Authorization for [place] (imatinib) has been approved.     Effective dates: 03/22/21 through 03/21/22  Patient's insurance requires that she fill through [facility]. Prescription redirected for dispensing.    Oral Oncology Clinic will continue to follow.   Leron Croak, PharmD, BCPS Hematology/Oncology Clinical Pharmacist [facility] [phone] 03/22/2021 1:14 PM

## 2021-03-22 NOTE — Telephone Encounter (Signed)
Oral Oncology Pharamcist Encounter  Received notification from [address] that prior authorization for [place] (imatinib) is required.  PA submitted on CoverMyMeds Key [id number] Status is pending  Oral Oncology Clinic will continue to follow.  Leron Croak, PharmD, BCPS Hematology/Oncology Clinical Pharmacist [facility] [phone] 03/22/2021 9:23 AM

## 2021-03-25 ENCOUNTER — Telehealth: Payer: Self-pay

## 2021-03-25 NOTE — Telephone Encounter (Signed)
Oral Oncology Patient Advocate Encounter  Received notification from [facility] that prior authorization for [place] is required.  PA submitted on CoverMyMeds Key [id number] Status is pending  Oral Oncology Clinic will continue to follow.   [place] Patient [place] Phone [phone] Fax [phone] 03/25/2021 8:54 AM

## 2021-03-25 NOTE — Telephone Encounter (Signed)
Oral Oncology Patient Advocate Encounter  Prior Authorization for [place] has been approved.    PA# [id number] Effective dates: 03/22/21 through 03/21/22  Patient must fill at [facility] will continue to follow.    [place] Patient [place] Phone [phone] Fax [phone] 03/25/2021 8:55 AM

## 2021-03-26 NOTE — Telephone Encounter (Signed)
Oral Chemotherapy Pharmacist Encounter  I spoke with patient for overview of Gleevec (imatinib) for the adjuvant treatment of GIST, planned duration 3 years.   Counseled patient on administration, dosing, side effects, monitoring, drug-food interactions, safe handling, storage, and disposal.  Patient will take Gleevec 400mg  tablets, 1 tablet (400mg ) by mouth once daily with a meal and a large glass of water.  Patient knows food may decrease stomach irritation and to maintain hydration while on treatment with Gleevec.  Patient counseled to avoid grapefruit and grapefruit juice.  Gleevec start date: 03/28/21  Adverse effects include but are not limited to: nausea, vomiting, diarrhea, fatigue, muscle cramps, lower extremity edema, rash, decreased blood counts, GI bleeding, and cardiac dysfunction.  Patient has anti-emetic on hand and knows to take it if nausea develops.   Patient will obtain anti diarrheal and alert the office of 4 or more loose stools above baseline.  Reviewed with patient importance of keeping a medication schedule and plan for any missed doses. No barriers to medication adherence identified.  Medication reconciliation performed and medication/allergy list updated.  Insurance authorization for [employer] has been obtained. Patient's insurance requires Gleevec be filled through [facility]. Patient stated it is estimated to be delivered to her home on 03/28/21.   All questions answered.  Paige Robertson voiced understanding and appreciation.   Medication education handout placed in mail for patient. Patient knows to call the office with questions or concerns. Oral Chemotherapy Clinic phone number provided to patient.   Leron Croak, PharmD, BCPS Hematology/Oncology Clinical Pharmacist [facility] [phone] 03/26/2021 3:11 PM

## 2021-03-27 ENCOUNTER — Other Ambulatory Visit: Payer: Self-pay

## 2021-03-27 NOTE — Progress Notes (Signed)
The proposed treatment discussed in conference is for discussion purposes only and is not a binding recommendation.  The patients have not been physically examined, or presented with their treatment options.  Therefore, final treatment plans cannot be decided.   

## 2021-03-29 ENCOUNTER — Telehealth: Payer: Self-pay

## 2021-03-29 ENCOUNTER — Other Ambulatory Visit: Payer: Self-pay

## 2021-03-29 MED ORDER — PROCHLORPERAZINE MALEATE 10 MG PO TABS: 10.0000 mg | ORAL_TABLET | Freq: Four times a day (QID) | ORAL | 0 refills | Status: DC | PRN

## 2021-03-29 NOTE — Telephone Encounter (Signed)
Prescription sent to pharmacy today.

## 2021-03-29 NOTE — Telephone Encounter (Signed)
-----   Message from Ladell Pier, MD sent at 03/29/2021 12:50 PM EDT ----- Regarding: RE: antiemetic Compazine 10 mg every 6 hours as needed, thanks ----- Message ----- From: Kelli Hope, LPN Sent: 8/91/6945  11:05 AM EDT To: Ladell Pier, MD Subject: antiemetic                                     Hi Dr. Benay Spice, Pt is starting gleevec and wants something for nausea. Just in case she gets nausea.

## 2021-04-03 ENCOUNTER — Telehealth: Payer: Self-pay | Admitting: Oncology

## 2021-04-05 ENCOUNTER — Other Ambulatory Visit: Payer: Self-pay | Admitting: Oncology

## 2021-04-05 DIAGNOSIS — C49A3 Gastrointestinal stromal tumor of small intestine: Secondary | ICD-10-CM

## 2021-04-10 ENCOUNTER — Inpatient Hospital Stay: Payer: BC Managed Care – PPO | Attending: Oncology

## 2021-04-10 ENCOUNTER — Other Ambulatory Visit: Payer: Self-pay

## 2021-04-10 ENCOUNTER — Encounter: Payer: Self-pay | Admitting: Oncology

## 2021-04-10 ENCOUNTER — Ambulatory Visit: Payer: BC Managed Care – PPO | Admitting: Oncology

## 2021-04-10 ENCOUNTER — Inpatient Hospital Stay (HOSPITAL_BASED_OUTPATIENT_CLINIC_OR_DEPARTMENT_OTHER): Payer: BC Managed Care – PPO | Admitting: Oncology

## 2021-04-10 ENCOUNTER — Other Ambulatory Visit: Payer: BC Managed Care – PPO

## 2021-04-10 VITALS — BP 93/69 | HR 84 | Temp 97.8°F | Resp 19 | Ht 62.0 in | Wt 130.8 lb

## 2021-04-10 DIAGNOSIS — C49A3 Gastrointestinal stromal tumor of small intestine: Secondary | ICD-10-CM | POA: Diagnosis not present

## 2021-04-10 LAB — CBC WITH DIFFERENTIAL (CANCER CENTER ONLY)
Abs Immature Granulocytes: 0 10*3/uL (ref 0.00–0.07)
Basophils Absolute: 0 10*3/uL (ref 0.0–0.1)
Basophils Relative: 1 %
Eosinophils Absolute: 0.1 10*3/uL (ref 0.0–0.5)
Eosinophils Relative: 4 %
HCT: 38.5 % (ref 36.0–46.0)
Hemoglobin: 12.4 g/dL (ref 12.0–15.0)
Immature Granulocytes: 0 %
Lymphocytes Relative: 31 %
Lymphs Abs: 1 10*3/uL (ref 0.7–4.0)
MCH: 30.7 pg (ref 26.0–34.0)
MCHC: 32.2 g/dL (ref 30.0–36.0)
MCV: 95.3 fL (ref 80.0–100.0)
Monocytes Absolute: 0.2 10*3/uL (ref 0.1–1.0)
Monocytes Relative: 7 %
Neutro Abs: 1.9 10*3/uL (ref 1.7–7.7)
Neutrophils Relative %: 57 %
Platelet Count: 154 10*3/uL (ref 150–400)
RBC: 4.04 MIL/uL (ref 3.87–5.11)
RDW: 13.1 % (ref 11.5–15.5)
WBC Count: 3.4 10*3/uL — ABNORMAL LOW (ref 4.0–10.5)
nRBC: 0 % (ref 0.0–0.2)

## 2021-04-10 LAB — CMP (CANCER CENTER ONLY)
ALT: 22 U/L (ref 0–44)
AST: 20 U/L (ref 15–41)
Albumin: 4.3 g/dL (ref 3.5–5.0)
Alkaline Phosphatase: 90 U/L (ref 38–126)
Anion gap: 6 (ref 5–15)
BUN: 12 mg/dL (ref 6–20)
CO2: 26 mmol/L (ref 22–32)
Calcium: 9.2 mg/dL (ref 8.9–10.3)
Chloride: 106 mmol/L (ref 98–111)
Creatinine: 0.69 mg/dL (ref 0.44–1.00)
GFR, Estimated: >60 mL/min (ref >60)
Glucose, Bld: 69 mg/dL — ABNORMAL LOW (ref 70–99)
Potassium: 3.8 mmol/L (ref 3.5–5.1)
Sodium: 138 mmol/L (ref 135–145)
Total Bilirubin: 0.3 mg/dL (ref 0.3–1.2)
Total Protein: 7.1 g/dL (ref 6.5–8.1)

## 2021-04-10 NOTE — Progress Notes (Signed)
Paige Robertson OFFICE PROGRESS NOTE   Diagnosis: Gastrointestinal stromal tumor  INTERVAL HISTORY:   Paige Robertson begin [place] on 03/28/2021.  No rash or diarrhea.  She has noted mild swelling of the eyelids.  She had noted a "lump "at the left upper neck and a similar area at the right calf (she had a bruise here prior to developing the lump) she continues to have mild abdominal soreness.  Her energy level has improved.  She is working part-time.  She does not want to take vitamin B12 replacement unless her level is low.  Objective:  Vital signs in last 24 hours:  Blood pressure 93/69, pulse 84, temperature 97.8 F (36.6 C), temperature source Oral, resp. rate 19, height 5' 2"  (1.575 m), weight 130 lb 12.8 oz (59.3 kg), SpO2 100 %.    HEENT: No thrush or ulcers.  1 cm oval mobile subcutaneous lesion at the left upper neck posterior to the mandible and inferior to the ear Lymphatics: No cervical or supraclavicular nodes Resp: Lungs clear bilaterally Cardio: Regular rate and rhythm GI: No hepatosplenomegaly, healed midline incision, mild tenderness surrounding the incision Vascular: No leg edema or erythema Skin: At the right upper calf there is a small resolving ecchymosis.  Superior to this there is a 1/2-1 cm mobile cutaneous nodular lesion.  No palpable cord or erythema   Lab Results:  Lab Results  Component Value Date   WBC 3.4 (L) 04/10/2021   HGB 12.4 04/10/2021   HCT 38.5 04/10/2021   MCV 95.3 04/10/2021   PLT 154 04/10/2021   NEUTROABS 1.9 04/10/2021    CMP  Lab Results  Component Value Date   NA 138 04/10/2021   K 3.8 04/10/2021   CL 106 04/10/2021   CO2 26 04/10/2021   GLUCOSE 69 (L) 04/10/2021   BUN 12 04/10/2021   CREATININE 0.69 04/10/2021   CALCIUM 9.2 04/10/2021   PROT 7.1 04/10/2021   ALBUMIN 4.3 04/10/2021   AST 20 04/10/2021   ALT 22 04/10/2021   ALKPHOS 90 04/10/2021   BILITOT 0.3 04/10/2021   GFRNONAA >60 04/10/2021   GFRAA >60  07/09/2020    Lab Results  Component Value Date   CEA1 0.7 02/18/2021     Medications: I have reviewed the patient's current medications.   Assessment/Plan: 1. Gastrointestinal stromal tumor of the jejunum  Jejunal mass/small bowel resection 02/18/2021- 10.9 cm tumor, 1 mitosis per 5 mm, Ki67-less than 1%, CD117 positive, pT4pNx  Gleevec starting 03/28/2021  2. History of anemia and iron deficiency secondary to bleeding from #1 3. Multiple hospital admissions with symptomatic anemia 4. 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  She began adjuvant Gleevec on 03/28/2021.  She is tolerating the [place] well.  She has mild periorbital edema.  She will call for increased edema.  The cutaneous nodular areas at the left upper neck and right calf are likely related to benign cyst or small lymph nodes.  I have a low clinical suspicion for metastatic disease or thrombophlebitis.  She will call if the calf nodule becomes painful and for new symptoms.  She is at potential increased risk of developing vitamin B12 deficiency with the small bowel resection.  The B12 level has been in the low normal range on repeat determinations.  She will discontinue vitamin B12.  We will repeat a vitamin B12 level in approximate 3 months.  Paige Robertson will return for an office and lab visit in 3 weeks.  [name], MD  04/10/2021  2:31 PM

## 2021-04-19 ENCOUNTER — Encounter: Payer: Self-pay | Admitting: Oncology

## 2021-04-19 ENCOUNTER — Other Ambulatory Visit: Payer: Self-pay | Admitting: *Deleted

## 2021-04-19 MED ORDER — NAPROXEN SODIUM 220 MG PO TABS: 220.0000 mg | ORAL_TABLET | Freq: Three times a day (TID) | ORAL | Status: DC | PRN

## 2021-05-01 ENCOUNTER — Other Ambulatory Visit: Payer: Self-pay

## 2021-05-01 ENCOUNTER — Inpatient Hospital Stay: Payer: BC Managed Care – PPO | Attending: Oncology | Admitting: Oncology

## 2021-05-01 ENCOUNTER — Telehealth: Payer: Self-pay

## 2021-05-01 ENCOUNTER — Inpatient Hospital Stay: Payer: BC Managed Care – PPO

## 2021-05-01 VITALS — BP 100/52 | HR 66 | Temp 98.0°F | Resp 20 | Wt 134.0 lb

## 2021-05-01 DIAGNOSIS — D649 Anemia, unspecified: Secondary | ICD-10-CM | POA: Insufficient documentation

## 2021-05-01 DIAGNOSIS — Z8616 Personal history of COVID-19: Secondary | ICD-10-CM | POA: Insufficient documentation

## 2021-05-01 DIAGNOSIS — C49A3 Gastrointestinal stromal tumor of small intestine: Secondary | ICD-10-CM

## 2021-05-01 LAB — CBC WITH DIFFERENTIAL (CANCER CENTER ONLY)
Abs Immature Granulocytes: 0 10*3/uL (ref 0.00–0.07)
Basophils Absolute: 0 10*3/uL (ref 0.0–0.1)
Basophils Relative: 1 %
Eosinophils Absolute: 0 10*3/uL (ref 0.0–0.5)
Eosinophils Relative: 3 %
HCT: 35.5 % — ABNORMAL LOW (ref 36.0–46.0)
Hemoglobin: 11.3 g/dL — ABNORMAL LOW (ref 12.0–15.0)
Immature Granulocytes: 0 %
Lymphocytes Relative: 59 %
Lymphs Abs: 0.9 10*3/uL (ref 0.7–4.0)
MCH: 30.2 pg (ref 26.0–34.0)
MCHC: 31.8 g/dL (ref 30.0–36.0)
MCV: 94.9 fL (ref 80.0–100.0)
Monocytes Absolute: 0.2 10*3/uL (ref 0.1–1.0)
Monocytes Relative: 13 %
Neutro Abs: 0.4 10*3/uL — CL (ref 1.7–7.7)
Neutrophils Relative %: 24 %
Platelet Count: 128 10*3/uL — ABNORMAL LOW (ref 150–400)
RBC: 3.74 MIL/uL — ABNORMAL LOW (ref 3.87–5.11)
RDW: 13.5 % (ref 11.5–15.5)
WBC Count: 1.6 10*3/uL — ABNORMAL LOW (ref 4.0–10.5)
nRBC: 0 % (ref 0.0–0.2)

## 2021-05-01 LAB — CMP (CANCER CENTER ONLY)
ALT: 36 U/L (ref 0–44)
AST: 23 U/L (ref 15–41)
Albumin: 4.2 g/dL (ref 3.5–5.0)
Alkaline Phosphatase: 114 U/L (ref 38–126)
Anion gap: 6 (ref 5–15)
BUN: 9 mg/dL (ref 6–20)
CO2: 27 mmol/L (ref 22–32)
Calcium: 9 mg/dL (ref 8.9–10.3)
Chloride: 106 mmol/L (ref 98–111)
Creatinine: 0.67 mg/dL (ref 0.44–1.00)
GFR, Estimated: >60 mL/min (ref >60)
Glucose, Bld: 84 mg/dL (ref 70–99)
Potassium: 4.3 mmol/L (ref 3.5–5.1)
Sodium: 139 mmol/L (ref 135–145)
Total Bilirubin: 0.3 mg/dL (ref 0.3–1.2)
Total Protein: 6.3 g/dL — ABNORMAL LOW (ref 6.5–8.1)

## 2021-05-01 MED ORDER — LEVOFLOXACIN 500 MG PO TABS: 500.0000 mg | ORAL_TABLET | Freq: Every day | ORAL | 0 refills | Status: DC

## 2021-05-01 NOTE — Telephone Encounter (Signed)
CRITICAL VALUE STICKER  CRITICAL VALUE: ANC 0.4  RECEIVER (on-site recipient of call): Donita Brooks  DATE & TIME NOTIFIED:  05/01/21 913  MESSENGER (representative from lab): [facility] Steward Drone   MD NOTIFIED: Dr. Benay Spice   TIME OF NOTIFICATION: 435  RESPONSE: Provider Aware

## 2021-05-01 NOTE — Telephone Encounter (Signed)
TC to [facility] specialty pharmacy per Dr Benay Spice Pt recently called in a prescription renewal for [place] and Dr Benay Spice request to cancel for dose change. Spoke with representative at [facility] [phone] prescription canceled. will call in new prescription with new dosage.

## 2021-05-01 NOTE — Progress Notes (Signed)
  [facility] OFFICE PROGRESS NOTE   Diagnosis: Gastrointestinal stromal tumor  INTERVAL HISTORY:   Paige Robertson returns as scheduled.  She continues Gleevec.  She developed a rash over the arms and legs last week.  The rash has improved.  No pruritus.  She has developed a burning discomfort toward the right side of the midline abdominal scar.  No nausea or diarrhea.  She has discomfort in the ankles.  She has noted mild swelling of the feet and persistent mild periorbital edema.  Objective:  Vital signs in last 24 hours:  Blood pressure (!) 100/52, pulse 66, temperature 98 F (36.7 C), temperature source Oral, resp. rate 20, weight 134 lb (60.8 kg), SpO2 100 %.    HEENT: No thrush or ulcers Resp: Lungs clear bilaterally Cardio: Regular rate and rhythm GI: No hepatosplenomegaly, no mass, mild tenderness at the right lower abdomen near the midline scar.   Vascular: No leg or foot edema  Skin: Fine dry rash at the upper arms-appears to be fading    Lab Results:  Lab Results  Component Value Date   WBC 1.6 (L) 05/01/2021   HGB 11.3 (L) 05/01/2021   HCT 35.5 (L) 05/01/2021   MCV 94.9 05/01/2021   PLT 128 (L) 05/01/2021   NEUTROABS 0.4 (LL) 05/01/2021    CMP  Lab Results  Component Value Date   NA 139 05/01/2021   K 4.3 05/01/2021   CL 106 05/01/2021   CO2 27 05/01/2021   GLUCOSE 84 05/01/2021   BUN 9 05/01/2021   CREATININE 0.67 05/01/2021   CALCIUM 9.0 05/01/2021   PROT 6.3 (L) 05/01/2021   ALBUMIN 4.2 05/01/2021   AST 23 05/01/2021   ALT 36 05/01/2021   ALKPHOS 114 05/01/2021   BILITOT 0.3 05/01/2021   GFRNONAA >60 05/01/2021   GFRAA >60 07/09/2020     Medications: I have reviewed the patient's current medications.   Assessment/Plan: 1. Gastrointestinal stromal tumor of the jejunum  Jejunal mass/small bowel resection 02/18/2021- 10.9 cm tumor, 1 mitosis per 5 mm, Ki67-less than 1%, CD117 positive, pT4pNx  Gleevec starting  03/28/2021  Gleevec held beginning 05/01/2021 secondary to neutropenia  2. History of anemia and iron deficiency secondary to bleeding from #1 3. Multiple hospital admissions with symptomatic anemia 4. 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 Neutropenia/thrombocytopenia secondary to [place] on 05/01/2021     Disposition: Paige Robertson has been maintained on [place] since 03/28/2021.  She is tolerating the [place] well, but she has developed severe neutropenia.  Gleevec will be placed on hold.  She will begin Levaquin prophylaxis.  She knows to call for a fever or symptoms of an infection.  She will return for an office visit and CBC on 05/06/2021.  The plan is to resume Gleevec at a dose of 200 mg daily when the neutrophil count has recovered.  We will escalate to 300 mg daily as tolerated.  She will follow-up with Dr. Grandville Silos to evaluate the discomfort near the abdominal scar.  This is most likely a benign postoperative finding.  [name], MD  05/01/2021  3:38 PM

## 2021-05-06 ENCOUNTER — Inpatient Hospital Stay (HOSPITAL_BASED_OUTPATIENT_CLINIC_OR_DEPARTMENT_OTHER): Payer: BC Managed Care – PPO | Admitting: Nurse Practitioner

## 2021-05-06 ENCOUNTER — Encounter: Payer: Self-pay | Admitting: Nurse Practitioner

## 2021-05-06 ENCOUNTER — Other Ambulatory Visit: Payer: Self-pay

## 2021-05-06 ENCOUNTER — Inpatient Hospital Stay: Payer: BC Managed Care – PPO

## 2021-05-06 VITALS — BP 102/66 | HR 73 | Temp 97.8°F | Resp 18 | Ht 62.0 in | Wt 129.8 lb

## 2021-05-06 DIAGNOSIS — C49A3 Gastrointestinal stromal tumor of small intestine: Secondary | ICD-10-CM | POA: Diagnosis not present

## 2021-05-06 LAB — CBC WITH DIFFERENTIAL (CANCER CENTER ONLY)
Abs Immature Granulocytes: 0.01 10*3/uL (ref 0.00–0.07)
Basophils Absolute: 0 10*3/uL (ref 0.0–0.1)
Basophils Relative: 1 %
Eosinophils Absolute: 0 10*3/uL (ref 0.0–0.5)
Eosinophils Relative: 1 %
HCT: 36.8 % (ref 36.0–46.0)
Hemoglobin: 12.2 g/dL (ref 12.0–15.0)
Immature Granulocytes: 0 %
Lymphocytes Relative: 42 %
Lymphs Abs: 1.8 10*3/uL (ref 0.7–4.0)
MCH: 30.7 pg (ref 26.0–34.0)
MCHC: 33.2 g/dL (ref 30.0–36.0)
MCV: 92.7 fL (ref 80.0–100.0)
Monocytes Absolute: 0.2 10*3/uL (ref 0.1–1.0)
Monocytes Relative: 5 %
Neutro Abs: 2.2 10*3/uL (ref 1.7–7.7)
Neutrophils Relative %: 51 %
Platelet Count: 146 10*3/uL — ABNORMAL LOW (ref 150–400)
RBC: 3.97 MIL/uL (ref 3.87–5.11)
RDW: 13.4 % (ref 11.5–15.5)
WBC Count: 4.3 10*3/uL (ref 4.0–10.5)
nRBC: 0 % (ref 0.0–0.2)

## 2021-05-06 MED ORDER — IMATINIB MESYLATE 100 MG PO TABS: 200.0000 mg | ORAL_TABLET | Freq: Every day | ORAL | 0 refills | Status: DC

## 2021-05-06 NOTE — Progress Notes (Signed)
  [place] OFFICE PROGRESS NOTE   Diagnosis: Gastrointestinal stromal tumor  INTERVAL HISTORY:   Paige [facility] returns as scheduled.  She denies nausea/vomiting.  No diarrhea.  Skin rash is better.  Leg edema has improved.  She recently noted a "knot" at the back of both lower legs, mild associated tenderness.  Objective:  Vital signs in last 24 hours:  Blood pressure 102/66, pulse 73, temperature 97.8 F (36.6 C), temperature source Oral, resp. rate 18, height _0  (1.575 m), weight 129 lb 12.8 oz (58.9 kg), SpO2 100 %.    Resp: Lungs clear bilaterally. Cardio: Regular rate and rhythm. GI: Abdomen soft and nontender.  No hepatosplenomegaly.  No mass. Vascular: No leg edema.  The left calf/lower leg appears slightly larger than the right lower leg.  Calves are soft and nontender.  No cord palpated.  No erythema in the area of indicated discomfort. Skin: Mild erythema at the chest and upper arms, question sunburn.   Lab Results:  Lab Results  Component Value Date   WBC 4.3 05/06/2021   HGB 12.2 05/06/2021   HCT 36.8 05/06/2021   MCV 92.7 05/06/2021   PLT 146 (L) 05/06/2021   NEUTROABS 2.2 05/06/2021    Imaging:  No results found.  Medications: I have reviewed the patient's current medications.  Assessment/Plan: 1. Gastrointestinal stromal tumor of the jejunum ? Jejunal mass/small bowel resection 02/18/2021- 10.9 cm tumor, 1 mitosis per 5 mm, Ki67-less than 1%, CD117 positive, pT4pNx ? [place] starting 03/28/2021 ? [place] held beginning 05/01/2021 secondary to neutropenia ? [place] resumed 200 mg daily 05/06/2021  2. History of anemia and iron deficiency secondary to bleeding from #1 3. Multiple hospital admissions with symptomatic anemia 4. 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 Neutropenia/thrombocytopenia secondary to [place] on 05/01/2021   Disposition: Paige Robertson appears stable.  Gleevec was placed on hold last week  due to neutropenia.  Review of the CBC from today shows the neutrophil count is in normal range.  Platelet count is higher as well.  She will resume Gleevec at a dose of 200 mg daily with the plan to escalate to 300 mg daily as tolerated.  She will return for a repeat CBC in 2 weeks.  She does not have significant leg edema but the left lower leg appears slightly larger than the right lower leg.  I am referring her for a venous Doppler.  She will return for lab and follow-up in 2 weeks.  She will contact the office in the interim with any problems.    Ned Card ANP/GNP-BC   05/06/2021  1:32 PM

## 2021-05-07 ENCOUNTER — Ambulatory Visit (HOSPITAL_COMMUNITY)
Admission: RE | Admit: 2021-05-07 | Discharge: 2021-05-07 | Disposition: A | Discharge status: Home / Self Care | Payer: BC Managed Care – PPO | Source: Ambulatory Visit | Attending: Nurse Practitioner | Admitting: Nurse Practitioner

## 2021-05-07 ENCOUNTER — Other Ambulatory Visit: Payer: Self-pay

## 2021-05-07 DIAGNOSIS — C49A3 Gastrointestinal stromal tumor of small intestine: Secondary | ICD-10-CM | POA: Diagnosis not present

## 2021-05-07 NOTE — Progress Notes (Signed)
Left lower extremity venous duplex has been completed. Preliminary results can be found in CV Proc through chart review.  Results were faxed to Ned Card NP.  05/07/21 9:20 AM Pa[facility] RVT

## 2021-05-14 ENCOUNTER — Encounter: Payer: Self-pay | Admitting: Oncology

## 2021-05-20 ENCOUNTER — Inpatient Hospital Stay: Payer: BC Managed Care – PPO

## 2021-05-20 ENCOUNTER — Inpatient Hospital Stay: Payer: BC Managed Care – PPO | Admitting: Oncology

## 2021-05-20 ENCOUNTER — Ambulatory Visit: Payer: BC Managed Care – PPO | Admitting: Oncology

## 2021-05-20 NOTE — Progress Notes (Deleted)
  [place] OFFICE PROGRESS NOTE   Diagnosis:   INTERVAL HISTORY:    Objective:  Vital signs in last 24 hours:  There were no vitals taken for this visit.    HEENT: *** Lymphatics: *** Resp: *** Cardio: *** GI: *** Vascular: *** Neuro:***  Skin:***   Portacath/PICC-without erythema  Lab Results:  Lab Results  Component Value Date   WBC 4.3 05/06/2021   HGB 12.2 05/06/2021   HCT 36.8 05/06/2021   MCV 92.7 05/06/2021   PLT 146 (L) 05/06/2021   NEUTROABS 2.2 05/06/2021    CMP  Lab Results  Component Value Date   NA 139 05/01/2021   K 4.3 05/01/2021   CL 106 05/01/2021   CO2 27 05/01/2021   GLUCOSE 84 05/01/2021   BUN 9 05/01/2021   CREATININE 0.67 05/01/2021   CALCIUM 9.0 05/01/2021   PROT 6.3 (L) 05/01/2021   ALBUMIN 4.2 05/01/2021   AST 23 05/01/2021   ALT 36 05/01/2021   ALKPHOS 114 05/01/2021   BILITOT 0.3 05/01/2021   GFRNONAA >60 05/01/2021   GFRAA >60 07/09/2020    Lab Results  Component Value Date   CEA1 0.7 02/18/2021    Lab Results  Component Value Date   INR 1.0 02/07/2021    Imaging:  No results found.  Medications: I have reviewed the patient's current medications.   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

## 2021-05-22 ENCOUNTER — Other Ambulatory Visit: Payer: Self-pay | Admitting: *Deleted

## 2021-05-22 MED ORDER — IMATINIB MESYLATE 100 MG PO TABS: 200.0000 mg | ORAL_TABLET | Freq: Every day | ORAL | 0 refills | Status: DC

## 2021-05-28 ENCOUNTER — Other Ambulatory Visit: Payer: Self-pay

## 2021-05-28 ENCOUNTER — Inpatient Hospital Stay: Payer: BC Managed Care – PPO

## 2021-05-28 ENCOUNTER — Inpatient Hospital Stay (HOSPITAL_BASED_OUTPATIENT_CLINIC_OR_DEPARTMENT_OTHER): Payer: BC Managed Care – PPO | Admitting: Oncology

## 2021-05-28 VITALS — BP 90/59 | HR 70 | Temp 97.8°F | Resp 20 | Ht 62.0 in | Wt 131.4 lb

## 2021-05-28 DIAGNOSIS — C49A3 Gastrointestinal stromal tumor of small intestine: Secondary | ICD-10-CM

## 2021-05-28 LAB — CBC WITH DIFFERENTIAL (CANCER CENTER ONLY)
Abs Immature Granulocytes: 0.01 10*3/uL (ref 0.00–0.07)
Basophils Absolute: 0 10*3/uL (ref 0.0–0.1)
Basophils Relative: 1 %
Eosinophils Absolute: 0.1 10*3/uL (ref 0.0–0.5)
Eosinophils Relative: 2 %
HCT: 35.2 % — ABNORMAL LOW (ref 36.0–46.0)
Hemoglobin: 11.6 g/dL — ABNORMAL LOW (ref 12.0–15.0)
Immature Granulocytes: 0 %
Lymphocytes Relative: 32 %
Lymphs Abs: 1.5 10*3/uL (ref 0.7–4.0)
MCH: 30.3 pg (ref 26.0–34.0)
MCHC: 33 g/dL (ref 30.0–36.0)
MCV: 91.9 fL (ref 80.0–100.0)
Monocytes Absolute: 0.3 10*3/uL (ref 0.1–1.0)
Monocytes Relative: 6 %
Neutro Abs: 2.8 10*3/uL (ref 1.7–7.7)
Neutrophils Relative %: 59 %
Platelet Count: 187 10*3/uL (ref 150–400)
RBC: 3.83 MIL/uL — ABNORMAL LOW (ref 3.87–5.11)
RDW: 13.2 % (ref 11.5–15.5)
WBC Count: 4.7 10*3/uL (ref 4.0–10.5)
nRBC: 0 % (ref 0.0–0.2)

## 2021-05-28 LAB — CMP (CANCER CENTER ONLY)
ALT: 26 U/L (ref 0–44)
AST: 20 U/L (ref 15–41)
Albumin: 4.5 g/dL (ref 3.5–5.0)
Alkaline Phosphatase: 112 U/L (ref 38–126)
Anion gap: 5 (ref 5–15)
BUN: 10 mg/dL (ref 6–20)
CO2: 29 mmol/L (ref 22–32)
Calcium: 9.8 mg/dL (ref 8.9–10.3)
Chloride: 105 mmol/L (ref 98–111)
Creatinine: 0.68 mg/dL (ref 0.44–1.00)
GFR, Estimated: >60 mL/min (ref >60)
Glucose, Bld: 83 mg/dL (ref 70–99)
Potassium: 4.1 mmol/L (ref 3.5–5.1)
Sodium: 139 mmol/L (ref 135–145)
Total Bilirubin: 0.4 mg/dL (ref 0.3–1.2)
Total Protein: 7.1 g/dL (ref 6.5–8.1)

## 2021-05-28 NOTE — Progress Notes (Signed)
  [facility] OFFICE PROGRESS NOTE   Diagnosis: Gastrointestinal stromal tumor  INTERVAL HISTORY:   Paige Robertson continues Glenvil at a dose of 200 mg daily.  No rash, swelling, or diarrhea.  She was diagnosed with COVID 19 infection on 05/18/2021.  These symptoms have resolved.  She feels well.  Objective:  Vital signs in last 24 hours:  Blood pressure (!) 90/59, pulse 70, temperature 97.8 F (36.6 C), temperature source Oral, resp. rate 20, height _0  (1.575 m), weight 131 lb 6.4 oz (59.6 kg), SpO2 100 %.    HEENT: No thrush or ulcers Resp: Lungs clear bilaterally Cardio: Regular rate and rhythm GI: No hepatosplenomegaly, no mass, nontender Vascular: No leg edema  Skin: No rash  Portacath/PICC-without erythema  Lab Results:  Lab Results  Component Value Date   WBC 4.7 05/28/2021   HGB 11.6 (L) 05/28/2021   HCT 35.2 (L) 05/28/2021   MCV 91.9 05/28/2021   PLT 187 05/28/2021   NEUTROABS 2.8 05/28/2021    CMP  Lab Results  Component Value Date   NA 139 05/01/2021   K 4.3 05/01/2021   CL 106 05/01/2021   CO2 27 05/01/2021   GLUCOSE 84 05/01/2021   BUN 9 05/01/2021   CREATININE 0.67 05/01/2021   CALCIUM 9.0 05/01/2021   PROT 6.3 (L) 05/01/2021   ALBUMIN 4.2 05/01/2021   AST 23 05/01/2021   ALT 36 05/01/2021   ALKPHOS 114 05/01/2021   BILITOT 0.3 05/01/2021   GFRNONAA >60 05/01/2021   GFRAA >60 07/09/2020    Lab Results  Component Value Date   CEA1 0.7 02/18/2021     Medications: I have reviewed the patient's current medications.   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  The white count and platelets are adequate.  She will continue Gleevec at a dose of 200 mg daily.  She will return for an office visit and CBC in 3 weeks.  We will plan to dose escalate to 300 mg daily if the CBC is stable in 3 weeks.  [name], MD  05/28/2021  8:07 AM

## 2021-06-05 ENCOUNTER — Encounter: Payer: Self-pay | Admitting: Oncology

## 2021-06-06 ENCOUNTER — Other Ambulatory Visit: Payer: BC Managed Care – PPO

## 2021-06-06 ENCOUNTER — Other Ambulatory Visit: Payer: Self-pay

## 2021-06-06 DIAGNOSIS — C49A3 Gastrointestinal stromal tumor of small intestine: Secondary | ICD-10-CM

## 2021-06-07 LAB — CBC WITH DIFFERENTIAL/PLATELET
Basophils Absolute: 0 10*3/uL (ref 0.0–0.2)
Basos: 1 %
EOS (ABSOLUTE): 0.1 10*3/uL (ref 0.0–0.4)
Eos: 3 %
Hematocrit: 37.5 % (ref 34.0–46.6)
Hemoglobin: 12.1 g/dL (ref 11.1–15.9)
Immature Grans (Abs): 0 10*3/uL (ref 0.0–0.1)
Immature Granulocytes: 0 %
Lymphocytes Absolute: 1.4 10*3/uL (ref 0.7–3.1)
Lymphs: 33 %
MCH: 30 pg (ref 26.6–33.0)
MCHC: 32.3 g/dL (ref 31.5–35.7)
MCV: 93 fL (ref 79–97)
Monocytes Absolute: 0.3 10*3/uL (ref 0.1–0.9)
Monocytes: 8 %
Neutrophils Absolute: 2.3 10*3/uL (ref 1.4–7.0)
Neutrophils: 55 %
Platelets: 180 10*3/uL (ref 150–450)
RBC: 4.03 x10E6/uL (ref 3.77–5.28)
RDW: 14.2 % (ref 11.7–15.4)
WBC: 4.2 10*3/uL (ref 3.4–10.8)

## 2021-06-07 LAB — VITAMIN B12: Vitamin B-12: 701 pg/mL (ref 232–1245)

## 2021-06-13 ENCOUNTER — Other Ambulatory Visit: Payer: Self-pay

## 2021-06-13 DIAGNOSIS — D509 Iron deficiency anemia, unspecified: Secondary | ICD-10-CM

## 2021-06-13 NOTE — Progress Notes (Signed)
Labs entered.

## 2021-06-20 ENCOUNTER — Other Ambulatory Visit: Payer: Self-pay

## 2021-06-20 ENCOUNTER — Inpatient Hospital Stay: Payer: BC Managed Care – PPO | Admitting: Nurse Practitioner

## 2021-06-20 ENCOUNTER — Other Ambulatory Visit: Payer: Self-pay | Admitting: *Deleted

## 2021-06-20 ENCOUNTER — Inpatient Hospital Stay: Payer: BC Managed Care – PPO | Attending: Oncology

## 2021-06-20 ENCOUNTER — Encounter: Payer: Self-pay | Admitting: Nurse Practitioner

## 2021-06-20 VITALS — BP 91/49 | HR 68 | Temp 97.7°F | Resp 16 | Wt 133.4 lb

## 2021-06-20 DIAGNOSIS — C49A3 Gastrointestinal stromal tumor of small intestine: Secondary | ICD-10-CM | POA: Diagnosis not present

## 2021-06-20 DIAGNOSIS — D649 Anemia, unspecified: Secondary | ICD-10-CM | POA: Diagnosis not present

## 2021-06-20 DIAGNOSIS — D509 Iron deficiency anemia, unspecified: Secondary | ICD-10-CM

## 2021-06-20 DIAGNOSIS — Z79899 Other long term (current) drug therapy: Secondary | ICD-10-CM | POA: Insufficient documentation

## 2021-06-20 LAB — CBC WITH DIFFERENTIAL (CANCER CENTER ONLY)
Abs Immature Granulocytes: 0.01 10*3/uL (ref 0.00–0.07)
Basophils Absolute: 0 10*3/uL (ref 0.0–0.1)
Basophils Relative: 1 %
Eosinophils Absolute: 0.1 10*3/uL (ref 0.0–0.5)
Eosinophils Relative: 3 %
HCT: 33.6 % — ABNORMAL LOW (ref 36.0–46.0)
Hemoglobin: 11.2 g/dL — ABNORMAL LOW (ref 12.0–15.0)
Immature Granulocytes: 0 %
Lymphocytes Relative: 30 %
Lymphs Abs: 1.3 10*3/uL (ref 0.7–4.0)
MCH: 31.5 pg (ref 26.0–34.0)
MCHC: 33.3 g/dL (ref 30.0–36.0)
MCV: 94.6 fL (ref 80.0–100.0)
Monocytes Absolute: 0.3 10*3/uL (ref 0.1–1.0)
Monocytes Relative: 6 %
Neutro Abs: 2.6 10*3/uL (ref 1.7–7.7)
Neutrophils Relative %: 60 %
Platelet Count: 201 10*3/uL (ref 150–400)
RBC: 3.55 MIL/uL — ABNORMAL LOW (ref 3.87–5.11)
RDW: 15.7 % — ABNORMAL HIGH (ref 11.5–15.5)
WBC Count: 4.3 10*3/uL (ref 4.0–10.5)
nRBC: 0 % (ref 0.0–0.2)

## 2021-06-20 LAB — CMP (CANCER CENTER ONLY)
ALT: 37 U/L (ref 0–44)
AST: 26 U/L (ref 15–41)
Albumin: 4.4 g/dL (ref 3.5–5.0)
Alkaline Phosphatase: 99 U/L (ref 38–126)
Anion gap: 5 (ref 5–15)
BUN: 6 mg/dL (ref 6–20)
CO2: 30 mmol/L (ref 22–32)
Calcium: 9.4 mg/dL (ref 8.9–10.3)
Chloride: 104 mmol/L (ref 98–111)
Creatinine: 0.61 mg/dL (ref 0.44–1.00)
GFR, Estimated: >60 mL/min (ref >60)
Glucose, Bld: 84 mg/dL (ref 70–99)
Potassium: 4.4 mmol/L (ref 3.5–5.1)
Sodium: 139 mmol/L (ref 135–145)
Total Bilirubin: 0.5 mg/dL (ref 0.3–1.2)
Total Protein: 6.7 g/dL (ref 6.5–8.1)

## 2021-06-20 MED ORDER — IMATINIB MESYLATE 100 MG PO TABS: 300.0000 mg | ORAL_TABLET | Freq: Every day | ORAL | 0 refills | Status: DC

## 2021-06-20 NOTE — Progress Notes (Signed)
  [place] OFFICE PROGRESS NOTE   Diagnosis: Gastrointestinal stromal tumor  INTERVAL HISTORY:   Paige Robertson returns as scheduled.  She continues Gleevec 200 mg daily.  She denies nausea/vomiting.  No diarrhea.  No rash.  She notes intermittent swelling at the lower legs.  She thinks this may be related to the "heat".  She feels eyelids are mildly edematous.  No abdominal pain.  Objective:  Vital signs in last 24 hours:  Blood pressure (!) 91/49, pulse 68, temperature 97.7 F (36.5 C), temperature source Temporal, resp. rate 16, weight 133 lb 6.4 oz (60.5 kg), SpO2 100 %.    HEENT: No thrush or ulcers.  No significant periorbital edema. Resp: Lungs clear bilaterally. Cardio: Regular rate and rhythm. GI: Abdomen soft and nontender.  No mass.  No hepatosplenomegaly. Vascular: No leg edema. Skin: No rash.   Lab Results:  Lab Results  Component Value Date   WBC 4.2 06/06/2021   HGB 12.1 06/06/2021   HCT 37.5 06/06/2021   MCV 93 06/06/2021   PLT 180 06/06/2021   NEUTROABS 2.3 06/06/2021    Imaging:  No results found.  Medications: I have reviewed the patient's current medications.  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  She is currently taking Gleevec 200 mg daily, tolerating well.  We reviewed the CBC.  White count and  platelets are adequate.  Mild progression of anemia.  She will escalate Gleevec to 300 mg daily.  Plan for follow-up CBC in 2 weeks.  Lab and follow-up in 4 weeks.  She will contact the office in the interim with any problems.  Plan reviewed with Dr. Benay Spice.    Ned Card ANP/GNP-BC   06/20/2021  8:15 AM

## 2021-06-20 NOTE — Progress Notes (Signed)
Gleevec dose increased to 300 mg daily. New script sent to [facility].

## 2021-07-02 ENCOUNTER — Other Ambulatory Visit: Payer: Self-pay | Admitting: Oncology

## 2021-07-04 ENCOUNTER — Other Ambulatory Visit: Payer: Self-pay

## 2021-07-04 ENCOUNTER — Encounter: Payer: Self-pay | Admitting: Oncology

## 2021-07-04 ENCOUNTER — Inpatient Hospital Stay: Payer: BC Managed Care – PPO | Attending: Oncology

## 2021-07-04 ENCOUNTER — Telehealth: Payer: Self-pay | Admitting: *Deleted

## 2021-07-04 DIAGNOSIS — Z79899 Other long term (current) drug therapy: Secondary | ICD-10-CM | POA: Diagnosis not present

## 2021-07-04 DIAGNOSIS — C49A3 Gastrointestinal stromal tumor of small intestine: Secondary | ICD-10-CM | POA: Insufficient documentation

## 2021-07-04 DIAGNOSIS — Z8616 Personal history of COVID-19: Secondary | ICD-10-CM | POA: Diagnosis not present

## 2021-07-04 LAB — CBC WITH DIFFERENTIAL (CANCER CENTER ONLY)
Abs Immature Granulocytes: 0 10*3/uL (ref 0.00–0.07)
Basophils Absolute: 0 10*3/uL (ref 0.0–0.1)
Basophils Relative: 1 %
Eosinophils Absolute: 0.1 10*3/uL (ref 0.0–0.5)
Eosinophils Relative: 2 %
HCT: 33.2 % — ABNORMAL LOW (ref 36.0–46.0)
Hemoglobin: 11.1 g/dL — ABNORMAL LOW (ref 12.0–15.0)
Immature Granulocytes: 0 %
Lymphocytes Relative: 28 %
Lymphs Abs: 1.1 10*3/uL (ref 0.7–4.0)
MCH: 32.6 pg (ref 26.0–34.0)
MCHC: 33.4 g/dL (ref 30.0–36.0)
MCV: 97.4 fL (ref 80.0–100.0)
Monocytes Absolute: 0.2 10*3/uL (ref 0.1–1.0)
Monocytes Relative: 5 %
Neutro Abs: 2.5 10*3/uL (ref 1.7–7.7)
Neutrophils Relative %: 64 %
Platelet Count: 173 10*3/uL (ref 150–400)
RBC: 3.41 MIL/uL — ABNORMAL LOW (ref 3.87–5.11)
RDW: 15.9 % — ABNORMAL HIGH (ref 11.5–15.5)
WBC Count: 3.9 10*3/uL — ABNORMAL LOW (ref 4.0–10.5)
nRBC: 0 % (ref 0.0–0.2)

## 2021-07-04 NOTE — Telephone Encounter (Signed)
Notified her that counts are stable and continue same dose of Gleevec and f/u as scheduled. She understands and agrees.

## 2021-07-04 NOTE — Telephone Encounter (Signed)
-----   Message from Owens Shark, NP sent at 07/04/2021 12:42 PM EDT ----- Please let her know counts are stable, follow-up as scheduled.

## 2021-07-18 ENCOUNTER — Other Ambulatory Visit: Payer: Self-pay

## 2021-07-18 ENCOUNTER — Inpatient Hospital Stay: Payer: BC Managed Care – PPO | Admitting: Oncology

## 2021-07-18 ENCOUNTER — Inpatient Hospital Stay: Payer: BC Managed Care – PPO

## 2021-07-18 VITALS — BP 96/61 | HR 76 | Temp 97.8°F | Resp 18 | Ht 62.0 in | Wt 133.2 lb

## 2021-07-18 DIAGNOSIS — C49A3 Gastrointestinal stromal tumor of small intestine: Secondary | ICD-10-CM | POA: Diagnosis not present

## 2021-07-18 LAB — CBC WITH DIFFERENTIAL (CANCER CENTER ONLY)
Abs Immature Granulocytes: 0 10*3/uL (ref 0.00–0.07)
Basophils Absolute: 0.1 10*3/uL (ref 0.0–0.1)
Basophils Relative: 2 %
Eosinophils Absolute: 0.1 10*3/uL (ref 0.0–0.5)
Eosinophils Relative: 3 %
HCT: 35 % — ABNORMAL LOW (ref 36.0–46.0)
Hemoglobin: 11.6 g/dL — ABNORMAL LOW (ref 12.0–15.0)
Immature Granulocytes: 0 %
Lymphocytes Relative: 36 %
Lymphs Abs: 1.2 10*3/uL (ref 0.7–4.0)
MCH: 32.9 pg (ref 26.0–34.0)
MCHC: 33.1 g/dL (ref 30.0–36.0)
MCV: 99.2 fL (ref 80.0–100.0)
Monocytes Absolute: 0.3 10*3/uL (ref 0.1–1.0)
Monocytes Relative: 7 %
Neutro Abs: 1.8 10*3/uL (ref 1.7–7.7)
Neutrophils Relative %: 52 %
Platelet Count: 194 10*3/uL (ref 150–400)
RBC: 3.53 MIL/uL — ABNORMAL LOW (ref 3.87–5.11)
RDW: 14.2 % (ref 11.5–15.5)
WBC Count: 3.4 10*3/uL — ABNORMAL LOW (ref 4.0–10.5)
nRBC: 0 % (ref 0.0–0.2)

## 2021-07-18 LAB — CMP (CANCER CENTER ONLY)
ALT: 23 U/L (ref 0–44)
AST: 19 U/L (ref 15–41)
Albumin: 4.3 g/dL (ref 3.5–5.0)
Alkaline Phosphatase: 89 U/L (ref 38–126)
Anion gap: 6 (ref 5–15)
BUN: 12 mg/dL (ref 6–20)
CO2: 30 mmol/L (ref 22–32)
Calcium: 9.7 mg/dL (ref 8.9–10.3)
Chloride: 104 mmol/L (ref 98–111)
Creatinine: 0.67 mg/dL (ref 0.44–1.00)
GFR, Estimated: >60 mL/min (ref >60)
Glucose, Bld: 88 mg/dL (ref 70–99)
Potassium: 4.2 mmol/L (ref 3.5–5.1)
Sodium: 140 mmol/L (ref 135–145)
Total Bilirubin: 0.4 mg/dL (ref 0.3–1.2)
Total Protein: 6.7 g/dL (ref 6.5–8.1)

## 2021-07-18 NOTE — Progress Notes (Signed)
  [facility] OFF[facility] PROGRESS NOTE   Diagnosis: Gastrointestinal stromal tumor  INTERVAL HISTORY:   Ms. Paige Robertson returns as scheduled.  She continues Gleevec at a dose of 300 mg daily.  She has swelling at the ankles with the hot weather.  No other swelling.  No rash or diarrhea.  She reports mild thinning of the hair.  Objective:  Vital signs in last 24 hours:  Blood pressure 96/61, pulse 76, temperature 97.8 F (36.6 C), resp. rate 18, height $RemoveBe'5\' 2"'[id number]$  (1.575 m), weight 133 lb 3.2 oz (60.4 kg), SpO2 100 %.    HEENT: No thrush or ulcers Resp: Lungs clear bilaterally Cardio: Regular rate and rhythm GI: Mild tenderness in the mid abdomen, no hepatosplenomegaly, no mass, no apparent ascites Vascular: No leg edema  Skin: No rash  Portacath/PICC-without erythema  Lab Results:  Lab Results  Component Value Date   WBC 3.4 (L) 07/18/2021   HGB 11.6 (L) 07/18/2021   HCT 35.0 (L) 07/18/2021   MCV 99.2 07/18/2021   PLT 194 07/18/2021   NEUTROABS 1.8 07/18/2021    CMP  Lab Results  Component Value Date   NA 139 06/20/2021   K 4.4 06/20/2021   CL 104 06/20/2021   CO2 30 06/20/2021   GLUCOSE 84 06/20/2021   BUN 6 06/20/2021   CREATININE 0.61 06/20/2021   CALCIUM 9.4 06/20/2021   PROT 6.7 06/20/2021   ALBUMIN 4.4 06/20/2021   AST 26 06/20/2021   ALT 37 06/20/2021   ALKPHOS 99 06/20/2021   BILITOT 0.5 06/20/2021   GFRNONAA >60 06/20/2021   GFRAA >60 07/09/2020    Lab Results  Component Value Date   CEA1 0.7 02/18/2021     Medications: I have reviewed the patient's current medications.   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  She is tolerating the [place] well at the current dose.  She will continue Gleevec, 300 mg daily.  She will return for an office and lab visit in 6 weeks.  [name], MD  07/18/2021  8:38 AM

## 2021-07-30 ENCOUNTER — Other Ambulatory Visit: Payer: Self-pay | Admitting: Oncology

## 2021-08-28 ENCOUNTER — Ambulatory Visit: Payer: BC Managed Care – PPO | Admitting: Oncology

## 2021-08-28 ENCOUNTER — Other Ambulatory Visit: Payer: BC Managed Care – PPO

## 2021-08-29 ENCOUNTER — Other Ambulatory Visit: Payer: Self-pay

## 2021-08-29 ENCOUNTER — Inpatient Hospital Stay: Payer: BC Managed Care – PPO | Admitting: Oncology

## 2021-08-29 ENCOUNTER — Inpatient Hospital Stay: Payer: BC Managed Care – PPO | Attending: Oncology

## 2021-08-29 VITALS — BP 100/60 | HR 70 | Temp 98.1°F | Resp 20 | Ht 62.0 in | Wt 135.0 lb

## 2021-08-29 DIAGNOSIS — C49A3 Gastrointestinal stromal tumor of small intestine: Secondary | ICD-10-CM | POA: Diagnosis present

## 2021-08-29 DIAGNOSIS — Z79899 Other long term (current) drug therapy: Secondary | ICD-10-CM | POA: Insufficient documentation

## 2021-08-29 DIAGNOSIS — D701 Agranulocytosis secondary to cancer chemotherapy: Secondary | ICD-10-CM | POA: Diagnosis not present

## 2021-08-29 DIAGNOSIS — T451X5D Adverse effect of antineoplastic and immunosuppressive drugs, subsequent encounter: Secondary | ICD-10-CM | POA: Insufficient documentation

## 2021-08-29 DIAGNOSIS — D649 Anemia, unspecified: Secondary | ICD-10-CM | POA: Insufficient documentation

## 2021-08-29 DIAGNOSIS — Z8616 Personal history of COVID-19: Secondary | ICD-10-CM | POA: Insufficient documentation

## 2021-08-29 DIAGNOSIS — D6959 Other secondary thrombocytopenia: Secondary | ICD-10-CM | POA: Insufficient documentation

## 2021-08-29 LAB — CMP (CANCER CENTER ONLY)
ALT: 24 U/L (ref 0–44)
AST: 21 U/L (ref 15–41)
Albumin: 4.7 g/dL (ref 3.5–5.0)
Alkaline Phosphatase: 83 U/L (ref 38–126)
Anion gap: 7 (ref 5–15)
BUN: 9 mg/dL (ref 6–20)
CO2: 27 mmol/L (ref 22–32)
Calcium: 9.8 mg/dL (ref 8.9–10.3)
Chloride: 105 mmol/L (ref 98–111)
Creatinine: 0.75 mg/dL (ref 0.44–1.00)
GFR, Estimated: >60 mL/min (ref >60)
Glucose, Bld: 84 mg/dL (ref 70–99)
Potassium: 4.1 mmol/L (ref 3.5–5.1)
Sodium: 139 mmol/L (ref 135–145)
Total Bilirubin: 0.4 mg/dL (ref 0.3–1.2)
Total Protein: 7.1 g/dL (ref 6.5–8.1)

## 2021-08-29 LAB — CBC WITH DIFFERENTIAL (CANCER CENTER ONLY)
Abs Immature Granulocytes: 0.02 10*3/uL (ref 0.00–0.07)
Basophils Absolute: 0 10*3/uL (ref 0.0–0.1)
Basophils Relative: 1 %
Eosinophils Absolute: 0.1 10*3/uL (ref 0.0–0.5)
Eosinophils Relative: 1 %
HCT: 34.8 % — ABNORMAL LOW (ref 36.0–46.0)
Hemoglobin: 12 g/dL (ref 12.0–15.0)
Immature Granulocytes: 0 %
Lymphocytes Relative: 15 %
Lymphs Abs: 0.8 10*3/uL (ref 0.7–4.0)
MCH: 33.4 pg (ref 26.0–34.0)
MCHC: 34.5 g/dL (ref 30.0–36.0)
MCV: 96.9 fL (ref 80.0–100.0)
Monocytes Absolute: 0.2 10*3/uL (ref 0.1–1.0)
Monocytes Relative: 4 %
Neutro Abs: 4.1 10*3/uL (ref 1.7–7.7)
Neutrophils Relative %: 79 %
Platelet Count: 169 10*3/uL (ref 150–400)
RBC: 3.59 MIL/uL — ABNORMAL LOW (ref 3.87–5.11)
RDW: 12.3 % (ref 11.5–15.5)
WBC Count: 5.1 10*3/uL (ref 4.0–10.5)
nRBC: 0 % (ref 0.0–0.2)

## 2021-08-29 NOTE — Progress Notes (Signed)
  [place] OFFICE PROGRESS NOTE   Diagnosis: Gastrointestinal stromal tumor  INTERVAL HISTORY:   Paige Robertson returns as scheduled.  She continues Gleevec at a dose of 300 mg daily.  No rash, diarrhea, or peripheral edema.  She feels the abdomen is "swollen ".  She has malaise.  Objective:  Vital signs in last 24 hours:  Blood pressure 100/60, pulse 70, temperature 98.1 F (36.7 C), temperature source Oral, resp. rate 20, height $RemoveBe'5\' 2"'[id number]$  (1.575 m), weight 135 lb (61.2 kg), SpO2 100 %.    HEENT: No thrush or ulcers, no periorbital edema Resp: Lungs clear bilaterally with distant breath sounds, no dullness to percussion, no respiratory distress Cardio: Regular rate and rhythm GI: Soft and nontender, no hepatosplenomegaly, no mass, no apparent ascites Vascular: No leg edema  Skin: No rash   Lab Results:  Lab Results  Component Value Date   WBC 5.1 08/29/2021   HGB 12.0 08/29/2021   HCT 34.8 (L) 08/29/2021   MCV 96.9 08/29/2021   PLT 169 08/29/2021   NEUTROABS 4.1 08/29/2021    CMP  Lab Results  Component Value Date   NA 140 07/18/2021   K 4.2 07/18/2021   CL 104 07/18/2021   CO2 30 07/18/2021   GLUCOSE 88 07/18/2021   BUN 12 07/18/2021   CREATININE 0.67 07/18/2021   CALCIUM 9.7 07/18/2021   PROT 6.7 07/18/2021   ALBUMIN 4.3 07/18/2021   AST 19 07/18/2021   ALT 23 07/18/2021   ALKPHOS 89 07/18/2021   BILITOT 0.4 07/18/2021   GFRNONAA >60 07/18/2021   GFRAA >60 07/09/2020    Lab Results  Component Value Date   CEA1 0.7 02/18/2021      Medications: I have reviewed the patient's current medications.   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  She is tolerating Gleevec well at the current dose.  She will increase to 300 mg alternating with 400 mg daily.  She will return for a CBC in 3 weeks and an office visit in 6 weeks.  I encouraged her to increase her activity level.  [name], MD  08/29/2021  8:13 AM

## 2021-09-03 ENCOUNTER — Encounter: Payer: Self-pay | Admitting: Medical

## 2021-09-03 ENCOUNTER — Ambulatory Visit: Payer: BC Managed Care – PPO | Admitting: Medical

## 2021-09-03 ENCOUNTER — Other Ambulatory Visit: Payer: Self-pay

## 2021-09-03 VITALS — BP 100/72 | HR 73 | Temp 98.1°F | Resp 16 | Ht 62.0 in | Wt 133.0 lb

## 2021-09-03 DIAGNOSIS — Z20822 Contact with and (suspected) exposure to covid-19: Secondary | ICD-10-CM

## 2021-09-03 DIAGNOSIS — H9203 Otalgia, bilateral: Secondary | ICD-10-CM

## 2021-09-03 DIAGNOSIS — R5383 Other fatigue: Secondary | ICD-10-CM

## 2021-09-03 DIAGNOSIS — H6983 Other specified disorders of Eustachian tube, bilateral: Secondary | ICD-10-CM

## 2021-09-03 LAB — POC COVID19 BINAXNOW: SARS Coronavirus 2 Ag: NEGATIVE

## 2021-09-03 MED ORDER — AMOXICILLIN-POT CLAVULANATE 875-125 MG PO TABS: 1.0000 | ORAL_TABLET | Freq: Two times a day (BID) | ORAL | 0 refills | Status: DC

## 2021-09-03 NOTE — Progress Notes (Signed)
Subjective:    Patient ID: Paige Robertson, female    DOB: Feb 03, 1976, 45 y.o.   MRN: [id number]  HPI 45 yo female in non acute distress.  Saturday   started with fatigue , nasal congestion.and just not feeling well.  Vaccinated Moderna no booster  She had Covid -19 May 2021.  Blood pressure 100/72, pulse 73, temperature 98.1 F (36.7 C), temperature source Oral, resp. rate 16, height 5\' 2"  (1.575 m), weight 133 lb (60.3 kg), SpO2 99 %.  Took Mucinex cold and flu this morning which did help some.  Review of Systems  Constitutional:  Positive for fatigue. Negative for chills and fever.  HENT:  Positive for congestion, ear pain (L>R), postnasal drip, rhinorrhea, sinus pressure (forehead), sinus pain and sore throat.   Respiratory:  Positive for cough. Negative for shortness of breath (previously) and wheezing.   Cardiovascular:  Negative for chest pain.  Gastrointestinal:  Negative for abdominal pain and diarrhea.  Musculoskeletal:  Negative for myalgias.  Skin:  Negative for color change.  Allergic/Immunologic: Positive for environmental allergies (spring and fall).  Neurological:  Positive for headaches. Negative for dizziness, syncope and light-headedness.       Blood pressure 100/72, pulse 73, temperature 98.1 F (36.7 C), temperature source Oral, resp. rate 16, height 5\' 2"  (1.575 m), weight 133 lb (60.3 kg), SpO2 99 %.  Allergies  Allergen Reactions   Feraheme [Ferumoxytol] Anaphylaxis   Sulfa Antibiotics Other (See Comments)    Childhood allergy, unknown reaction    On chemo therapy now Past Medical History:  Diagnosis Date   Acid reflux    Hematuria    Hyperlipemia    IDA (iron deficiency anemia)    Melena    Perimenopausal symptoms    Vaginal discharge     Objective:   Physical Exam Vitals and nursing note reviewed.  Constitutional:      Appearance: Normal appearance. She is normal weight.  HENT:     Head: Normocephalic and atraumatic.     Right Ear:  Ear canal and external ear normal.     Left Ear: Ear canal and external ear normal.     Mouth/Throat:     Mouth: Mucous membranes are dry.     Pharynx: Oropharynx is clear.  Eyes:     Extraocular Movements: Extraocular movements intact.     Conjunctiva/sclera: Conjunctivae normal.     Pupils: Pupils are equal, round, and reactive to light.  Neurological:     Mental Status: She is alert.     Recent Results (from the past 2160 hour(s))  CBC with Differential (Cancer Center Only)     Status: Abnormal   Collection Time: 07/04/21  7:56 AM  Result Value Ref Range   WBC Count 3.9 (L) 4.0 - 10.5 K/uL   RBC 3.41 (L) 3.87 - 5.11 MIL/uL   Hemoglobin 11.1 (L) 12.0 - 15.0 g/dL   HCT 25.9 (L) 56.3 - 87.5 %   MCV 97.4 80.0 - 100.0 fL   MCH 32.6 26.0 - 34.0 pg   MCHC 33.4 30.0 - 36.0 g/dL   RDW 64.3 (H) 32.9 - 51.8 %   Platelet Count 173 150 - 400 K/uL   nRBC 0.0 0.0 - 0.2 %   Neutrophils Relative % 64 %   Neutro Abs 2.5 1.7 - 7.7 K/uL   Lymphocytes Relative 28 %   Lymphs Abs 1.1 0.7 - 4.0 K/uL   Monocytes Relative 5 %   Monocytes Absolute 0.2 0.1 - 1.0  K/uL   Eosinophils Relative 2 %   Eosinophils Absolute 0.1 0.0 - 0.5 K/uL   Basophils Relative 1 %   Basophils Absolute 0.0 0.0 - 0.1 K/uL   Immature Granulocytes 0 %   Abs Immature Granulocytes 0.00 0.00 - 0.07 K/uL    Comment: Performed at [facility], [address], [place], [place] [address]  CMP (Cancer Center only)     Status: None   Collection Time: 07/18/21  7:51 AM  Result Value Ref Range   Sodium 140 135 - 145 mmol/L   Potassium 4.2 3.5 - 5.1 mmol/L   Chloride 104 98 - 111 mmol/L   CO2 30 22 - 32 mmol/L   Glucose, Bld 88 70 - 99 mg/dL    Comment: Glucose reference range applies only to samples taken after fasting for at least 8 hours.   BUN 12 6 - 20 mg/dL   Creatinine 3.66 4.40 - 1.00 mg/dL   Calcium 9.7 8.9 - 34.7 mg/dL   Total Protein 6.7 6.5 - 8.1 g/dL   Albumin 4.3 3.5 - 5.0 g/dL   AST 19 15  - 41 U/L   ALT 23 0 - 44 U/L   Alkaline Phosphatase 89 38 - 126 U/L   Total Bilirubin 0.4 0.3 - 1.2 mg/dL   GFR, Estimated >42 >59 mL/min    Comment: (NOTE) Calculated using the CKD-EPI Creatinine Equation (2021)    Anion gap 6 5 - 15    Comment: Performed at [facility], [address], [place], [place] [address]  CBC with Differential (Cancer Center Only)     Status: Abnormal   Collection Time: 07/18/21  7:51 AM  Result Value Ref Range   WBC Count 3.4 (L) 4.0 - 10.5 K/uL   RBC 3.53 (L) 3.87 - 5.11 MIL/uL   Hemoglobin 11.6 (L) 12.0 - 15.0 g/dL   HCT 56.4 (L) 33.2 - 95.1 %   MCV 99.2 80.0 - 100.0 fL   MCH 32.9 26.0 - 34.0 pg   MCHC 33.1 30.0 - 36.0 g/dL   RDW 88.4 16.6 - 06.3 %   Platelet Count 194 150 - 400 K/uL   nRBC 0.0 0.0 - 0.2 %   Neutrophils Relative % 52 %   Neutro Abs 1.8 1.7 - 7.7 K/uL   Lymphocytes Relative 36 %   Lymphs Abs 1.2 0.7 - 4.0 K/uL   Monocytes Relative 7 %   Monocytes Absolute 0.3 0.1 - 1.0 K/uL   Eosinophils Relative 3 %   Eosinophils Absolute 0.1 0.0 - 0.5 K/uL   Basophils Relative 2 %   Basophils Absolute 0.1 0.0 - 0.1 K/uL   Immature Granulocytes 0 %   Abs Immature Granulocytes 0.00 0.00 - 0.07 K/uL    Comment: Performed at [facility], [address] [place], [place] [address]  CMP (Cancer Center only)     Status: None   Collection Time: 08/29/21  7:56 AM  Result Value Ref Range   Sodium 139 135 - 145 mmol/L   Potassium 4.1 3.5 - 5.1 mmol/L   Chloride 105 98 - 111 mmol/L   CO2 27 22 - 32 mmol/L   Glucose, Bld 84 70 - 99 mg/dL    Comment: Glucose reference range applies only to samples taken after fasting for at least 8 hours.   BUN 9 6 - 20 mg/dL   Creatinine 0.93 2.35 - 1.00 mg/dL   Calcium 9.8 8.9 - 57.3 mg/dL   Total Protein 7.1  6.5 - 8.1 g/dL   Albumin 4.7 3.5 - 5.0 g/dL   AST 21 15 - 41 U/L   ALT 24 0 - 44 U/L   Alkaline Phosphatase 83 38 - 126 U/L   Total Bilirubin 0.4 0.3 - 1.2 mg/dL    GFR, Estimated >16 >10 mL/min    Comment: (NOTE) Calculated using the CKD-EPI Creatinine Equation (2021)    Anion gap 7 5 - 15    Comment: Performed at [facility], [address] [place], [place] [address]  CBC with Differential (Cancer Center Only)     Status: Abnormal   Collection Time: 08/29/21  7:56 AM  Result Value Ref Range   WBC Count 5.1 4.0 - 10.5 K/uL   RBC 3.59 (L) 3.87 - 5.11 MIL/uL   Hemoglobin 12.0 12.0 - 15.0 g/dL   HCT 40.9 (L) 81.1 - 91.4 %   MCV 96.9 80.0 - 100.0 fL   MCH 33.4 26.0 - 34.0 pg   MCHC 34.5 30.0 - 36.0 g/dL   RDW 78.2 95.6 - 21.3 %   Platelet Count 169 150 - 400 K/uL   nRBC 0.0 0.0 - 0.2 %   Neutrophils Relative % 79 %   Neutro Abs 4.1 1.7 - 7.7 K/uL   Lymphocytes Relative 15 %   Lymphs Abs 0.8 0.7 - 4.0 K/uL   Monocytes Relative 4 %   Monocytes Absolute 0.2 0.1 - 1.0 K/uL   Eosinophils Relative 1 %   Eosinophils Absolute 0.1 0.0 - 0.5 K/uL   Basophils Relative 1 %   Basophils Absolute 0.0 0.0 - 0.1 K/uL   Immature Granulocytes 0 %   Abs Immature Granulocytes 0.02 0.00 - 0.07 K/uL    Comment: Performed at [facility], [address], [place], [place] [address]  POC COVID-19     Status: Normal   Collection Time: 09/03/21  1:49 PM  Result Value Ref Range   SARS Coronavirus 2 Ag Negative Negative    Comment: Patient aware of negative POC. Has appt with H.Deantre Bourdon PAC  Novel Coronavirus, NAA ([facility])     Status: None   Collection Time: 09/03/21  1:55 PM   Specimen: Nasopharyngeal(NP) swabs in vial transport medium   Nasopharynge  Previous  Result Value Ref Range   SARS-CoV-2, NAA Not Detected Not Detected    Comment: This nucleic acid amplification test was developed and its performance characteristics determined by [facility]. Nucleic acid amplification tests include RT-PCR and TMA. This test has not been FDA cleared or approved. This test has been authorized by FDA under an Emergency  Use Authorization (EUA). This test is only authorized for the duration of time the declaration that circumstances exist justifying the authorization of the emergency use of in vitro diagnostic tests for detection of SARS-CoV-2 virus and/or diagnosis of COVID-19 infection under section 564(b)(1) of the Act, 21 U.S.C. 846NGE-9(B) (1), unless the authorization is terminated or revoked sooner. When diagnostic testing is negative, the possibility of a false negative result should be considered in the context of a patient's recent exposures and the presence of clinical signs and symptoms consistent with COVID-19. An individual without symptoms of COVID-19 and who is not shedding SARS-CoV-2 virus wo uld expect to have a negative (not detected) result in this assay.   SARS-COV-2, NAA 2 DAY TAT     Status: None   Collection Time: 09/03/21  1:55 PM   Nasopharynge  Previous  Result Value Ref Range   SARS-CoV-2, NAA 2  DAY TAT Performed   CBC with Differential (Cancer Center Only)     Status: Abnormal   Collection Time: 09/18/21  8:06 AM  Result Value Ref Range   WBC Count 3.4 (L) 4.0 - 10.5 K/uL   RBC 3.61 (L) 3.87 - 5.11 MIL/uL   Hemoglobin 11.8 (L) 12.0 - 15.0 g/dL   HCT 16.1 (L) 09.6 - 04.5 %   MCV 97.0 80.0 - 100.0 fL   MCH 32.7 26.0 - 34.0 pg   MCHC 33.7 30.0 - 36.0 g/dL   RDW 40.9 81.1 - 91.4 %   Platelet Count 160 150 - 400 K/uL   nRBC 0.0 0.0 - 0.2 %   Neutrophils Relative % 49 %   Neutro Abs 1.7 1.7 - 7.7 K/uL   Lymphocytes Relative 40 %   Lymphs Abs 1.4 0.7 - 4.0 K/uL   Monocytes Relative 6 %   Monocytes Absolute 0.2 0.1 - 1.0 K/uL   Eosinophils Relative 4 %   Eosinophils Absolute 0.1 0.0 - 0.5 K/uL   Basophils Relative 1 %   Basophils Absolute 0.0 0.0 - 0.1 K/uL   Immature Granulocytes 0 %   Abs Immature Granulocytes 0.00 0.00 - 0.07 K/uL    Comment: Performed at [facility], [address], [place], [place] [address]         Assessment & Plan:   Eustachian tube dysfunction bilaterally, Ear pain bilateral. Fatigue Not feeling well Pending PCR Covid-19 Meds ordered this encounter  Medications   amoxicillin-clavulanate (AUGMENTIN) 875-125 MG tablet    Sig: Take 1 tablet by mouth 2 (two) times daily.    Dispense:  20 tablet    Refill:  0    Orders Placed This Encounter  Procedures   Novel Coronavirus, NAA ([facility])    Order Specific Question:   Previously tested for COVID-19    Answer:   Yes    Order Specific Question:   Resident in a congregate (group) care setting    Answer:   No    Order Specific Question:   Is the patient student?    Answer:   No    Order Specific Question:   Employed in healthcare setting    Answer:   No    Order Specific Question:   Pregnant    Answer:   No    Order Specific Question:   Has patient completed COVID vaccination(s) (2 doses of Pfizer/Moderna 1 dose of [employer])    Answer:   No    Order Specific Question:   Release to patient    Answer:   Immediate   POC COVID-19    Order Specific Question:   Previously tested for COVID-19    Answer:   Yes    Order Specific Question:   Resident in a congregate (group) care setting    Answer:   No    Order Specific Question:   Employed in healthcare setting    Answer:   No    Order Specific Question:   Pregnant    Answer:   No   Isolate rest, increase fluids, OTC Tylenol per package instructions for fever or pain. Patient verbalizes understanding and has no questions at discharge.

## 2021-09-03 NOTE — Patient Instructions (Signed)

## 2021-09-04 ENCOUNTER — Encounter: Payer: Self-pay | Admitting: Nurse Practitioner

## 2021-09-04 ENCOUNTER — Telehealth: Payer: Self-pay | Admitting: Nurse Practitioner

## 2021-09-04 LAB — SARS-COV-2, NAA 2 DAY TAT

## 2021-09-04 LAB — NOVEL CORONAVIRUS, NAA: SARS-CoV-2, NAA: NOT DETECTED

## 2021-09-04 NOTE — Telephone Encounter (Signed)
Left vm to call for lab results

## 2021-09-18 ENCOUNTER — Other Ambulatory Visit: Payer: Self-pay

## 2021-09-18 ENCOUNTER — Telehealth: Payer: Self-pay

## 2021-09-18 ENCOUNTER — Inpatient Hospital Stay: Payer: BC Managed Care – PPO | Attending: Oncology

## 2021-09-18 DIAGNOSIS — C49A3 Gastrointestinal stromal tumor of small intestine: Secondary | ICD-10-CM | POA: Diagnosis not present

## 2021-09-18 LAB — CBC WITH DIFFERENTIAL (CANCER CENTER ONLY)
Abs Immature Granulocytes: 0 10*3/uL (ref 0.00–0.07)
Basophils Absolute: 0 10*3/uL (ref 0.0–0.1)
Basophils Relative: 1 %
Eosinophils Absolute: 0.1 10*3/uL (ref 0.0–0.5)
Eosinophils Relative: 4 %
HCT: 35 % — ABNORMAL LOW (ref 36.0–46.0)
Hemoglobin: 11.8 g/dL — ABNORMAL LOW (ref 12.0–15.0)
Immature Granulocytes: 0 %
Lymphocytes Relative: 40 %
Lymphs Abs: 1.4 10*3/uL (ref 0.7–4.0)
MCH: 32.7 pg (ref 26.0–34.0)
MCHC: 33.7 g/dL (ref 30.0–36.0)
MCV: 97 fL (ref 80.0–100.0)
Monocytes Absolute: 0.2 10*3/uL (ref 0.1–1.0)
Monocytes Relative: 6 %
Neutro Abs: 1.7 10*3/uL (ref 1.7–7.7)
Neutrophils Relative %: 49 %
Platelet Count: 160 10*3/uL (ref 150–400)
RBC: 3.61 MIL/uL — ABNORMAL LOW (ref 3.87–5.11)
RDW: 12.3 % (ref 11.5–15.5)
WBC Count: 3.4 10*3/uL — ABNORMAL LOW (ref 4.0–10.5)
nRBC: 0 % (ref 0.0–0.2)

## 2021-09-18 NOTE — Telephone Encounter (Signed)
-----   Message from Ladell Pier, MD sent at 09/18/2021 12:25 PM EDT ----- Please call patient, white count is ok, continue gleevec same dose, f/u as scheduled

## 2021-09-18 NOTE — Telephone Encounter (Signed)
Pt verbalized understanding. No further problems or concerns noted. 

## 2021-10-10 ENCOUNTER — Inpatient Hospital Stay: Payer: BC Managed Care – PPO

## 2021-10-10 ENCOUNTER — Other Ambulatory Visit: Payer: Self-pay

## 2021-10-10 ENCOUNTER — Inpatient Hospital Stay: Payer: BC Managed Care – PPO | Attending: Oncology | Admitting: Oncology

## 2021-10-10 VITALS — BP 92/60 | HR 68 | Temp 98.1°F | Resp 20 | Ht 62.0 in | Wt 133.6 lb

## 2021-10-10 DIAGNOSIS — C49A3 Gastrointestinal stromal tumor of small intestine: Secondary | ICD-10-CM | POA: Insufficient documentation

## 2021-10-10 DIAGNOSIS — Z8616 Personal history of COVID-19: Secondary | ICD-10-CM | POA: Diagnosis not present

## 2021-10-10 DIAGNOSIS — T451X5A Adverse effect of antineoplastic and immunosuppressive drugs, initial encounter: Secondary | ICD-10-CM | POA: Insufficient documentation

## 2021-10-10 DIAGNOSIS — D649 Anemia, unspecified: Secondary | ICD-10-CM | POA: Diagnosis not present

## 2021-10-10 DIAGNOSIS — D6959 Other secondary thrombocytopenia: Secondary | ICD-10-CM | POA: Diagnosis not present

## 2021-10-10 DIAGNOSIS — R5381 Other malaise: Secondary | ICD-10-CM | POA: Insufficient documentation

## 2021-10-10 DIAGNOSIS — D702 Other drug-induced agranulocytosis: Secondary | ICD-10-CM | POA: Diagnosis not present

## 2021-10-10 DIAGNOSIS — Z79899 Other long term (current) drug therapy: Secondary | ICD-10-CM | POA: Diagnosis not present

## 2021-10-10 LAB — CBC WITH DIFFERENTIAL (CANCER CENTER ONLY)
Abs Immature Granulocytes: 0 10*3/uL (ref 0.00–0.07)
Basophils Absolute: 0 10*3/uL (ref 0.0–0.1)
Basophils Relative: 1 %
Eosinophils Absolute: 0.1 10*3/uL (ref 0.0–0.5)
Eosinophils Relative: 2 %
HCT: 34.7 % — ABNORMAL LOW (ref 36.0–46.0)
Hemoglobin: 11.6 g/dL — ABNORMAL LOW (ref 12.0–15.0)
Immature Granulocytes: 0 %
Lymphocytes Relative: 31 %
Lymphs Abs: 1.2 10*3/uL (ref 0.7–4.0)
MCH: 32.9 pg (ref 26.0–34.0)
MCHC: 33.4 g/dL (ref 30.0–36.0)
MCV: 98.3 fL (ref 80.0–100.0)
Monocytes Absolute: 0.3 10*3/uL (ref 0.1–1.0)
Monocytes Relative: 7 %
Neutro Abs: 2.2 10*3/uL (ref 1.7–7.7)
Neutrophils Relative %: 59 %
Platelet Count: 189 10*3/uL (ref 150–400)
RBC: 3.53 MIL/uL — ABNORMAL LOW (ref 3.87–5.11)
RDW: 13.5 % (ref 11.5–15.5)
WBC Count: 3.7 10*3/uL — ABNORMAL LOW (ref 4.0–10.5)
nRBC: 0 % (ref 0.0–0.2)

## 2021-10-10 LAB — CMP (CANCER CENTER ONLY)
ALT: 45 U/L — ABNORMAL HIGH (ref 0–44)
AST: 32 U/L (ref 15–41)
Albumin: 4.5 g/dL (ref 3.5–5.0)
Alkaline Phosphatase: 82 U/L (ref 38–126)
Anion gap: 5 (ref 5–15)
BUN: 12 mg/dL (ref 6–20)
CO2: 28 mmol/L (ref 22–32)
Calcium: 9.4 mg/dL (ref 8.9–10.3)
Chloride: 105 mmol/L (ref 98–111)
Creatinine: 0.67 mg/dL (ref 0.44–1.00)
GFR, Estimated: >60 mL/min (ref >60)
Glucose, Bld: 84 mg/dL (ref 70–99)
Potassium: 4.5 mmol/L (ref 3.5–5.1)
Sodium: 138 mmol/L (ref 135–145)
Total Bilirubin: 0.6 mg/dL (ref 0.3–1.2)
Total Protein: 7.2 g/dL (ref 6.5–8.1)

## 2021-10-10 NOTE — Progress Notes (Signed)
  [facility] OFFICE PROGRESS NOTE   Diagnosis: Gastrointestinal stromal tumor  INTERVAL HISTORY:   Ms. Dopson returns as scheduled.  She is now taking Gleevec at a dose of 300 mg alternating with 400 mg daily.  She reports malaise.  No rash or diarrhea.  No swelling.  Objective:  Vital signs in last 24 hours:  Blood pressure 92/60, pulse 68, temperature 98.1 F (36.7 C), temperature source Oral, resp. rate 20, height $RemoveBe'5\' 2"'[id number]$  (1.575 m), weight 133 lb 9.6 oz (60.6 kg), SpO2 100 %.    HEENT: No thrush or ulcers Resp: Lungs clear bilaterally Cardio: Regular rate and rhythm GI: No hepatosplenomegaly, no mass, nontender Vascular: No leg edema  Skin: No rash  Portacath/PICC-without erythema  Lab Results:  Lab Results  Component Value Date   WBC 3.7 (L) 10/10/2021   HGB 11.6 (L) 10/10/2021   HCT 34.7 (L) 10/10/2021   MCV 98.3 10/10/2021   PLT 189 10/10/2021   NEUTROABS 2.2 10/10/2021    CMP  Lab Results  Component Value Date   NA 139 08/29/2021   K 4.1 08/29/2021   CL 105 08/29/2021   CO2 27 08/29/2021   GLUCOSE 84 08/29/2021   BUN 9 08/29/2021   CREATININE 0.75 08/29/2021   CALCIUM 9.8 08/29/2021   PROT 7.1 08/29/2021   ALBUMIN 4.7 08/29/2021   AST 21 08/29/2021   ALT 24 08/29/2021   ALKPHOS 83 08/29/2021   BILITOT 0.4 08/29/2021   GFRNONAA >60 08/29/2021   GFRAA >60 07/09/2020    Lab Results  Component Value Date   CEA1 0.7 02/18/2021   Medications: I have reviewed the patient's current medications.   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 Saylor appears stable.  She is tolerating the increased dose of Gleevec well.  The neutrophil and platelet counts are adequate.  The plan is to continue Gleevec at the current dose.  She will return for an office and lab visit in 5 weeks.  [name], MD  10/10/2021  8:36 AM

## 2021-10-15 ENCOUNTER — Other Ambulatory Visit: Payer: Self-pay | Admitting: *Deleted

## 2021-10-15 MED ORDER — IMATINIB MESYLATE 100 MG PO TABS: ORAL_TABLET | ORAL | 1 refills | Status: DC

## 2021-10-15 NOTE — Progress Notes (Signed)
[facility] faxed refill request for [place]. Faxed refill.

## 2021-10-21 ENCOUNTER — Telehealth: Payer: Self-pay | Admitting: Surgery

## 2021-10-21 NOTE — Telephone Encounter (Signed)
[facility] called regarding the pt's [place].  The prescription was written to take the brand name, but the pt had taken the generic in the past.  I notified Dr. Benay Spice, and he stated that the pt can take the generic Gleevec.  I called [facility] back and they changed the prescription so that the generic Gleevec could be used.

## 2021-10-28 ENCOUNTER — Telehealth: Payer: Self-pay | Admitting: Pharmacy Technician

## 2021-10-28 NOTE — Telephone Encounter (Signed)
Oral Oncology Patient Advocate Encounter  Prior Authorization for Imatinib (100 mg) has been approved.    Effective dates: 10/28/21 through 10/27/22  Oral Oncology Clinic will continue to follow.   [place] Patient [place] Phone [phone] Fax [phone] 10/28/2021 3:24 PM

## 2021-10-28 NOTE — Telephone Encounter (Signed)
Oral Oncology Patient Advocate Encounter   Received notification from [facility] that the existing prior authorization for Imatinib is due for renewal.   Renewal PA submitted on CoverMyMeds Key [id number] Status is pending   Oral Oncology Clinic will continue to follow.  [place] Patient [place] Phone [phone] Fax [phone] 10/28/2021 3:23 PM

## 2021-10-29 ENCOUNTER — Other Ambulatory Visit (HOSPITAL_COMMUNITY): Payer: Self-pay

## 2021-11-14 ENCOUNTER — Inpatient Hospital Stay: Payer: BC Managed Care – PPO | Attending: Oncology | Admitting: Oncology

## 2021-11-14 ENCOUNTER — Telehealth: Payer: Self-pay

## 2021-11-14 ENCOUNTER — Inpatient Hospital Stay: Payer: BC Managed Care – PPO

## 2021-11-14 ENCOUNTER — Other Ambulatory Visit: Payer: Self-pay

## 2021-11-14 VITALS — BP 101/63 | HR 77 | Temp 98.1°F | Resp 20 | Ht 62.0 in | Wt 136.8 lb

## 2021-11-14 DIAGNOSIS — D649 Anemia, unspecified: Secondary | ICD-10-CM | POA: Insufficient documentation

## 2021-11-14 DIAGNOSIS — C49A3 Gastrointestinal stromal tumor of small intestine: Secondary | ICD-10-CM

## 2021-11-14 DIAGNOSIS — Z8616 Personal history of COVID-19: Secondary | ICD-10-CM | POA: Diagnosis not present

## 2021-11-14 LAB — CBC WITH DIFFERENTIAL (CANCER CENTER ONLY)
Abs Immature Granulocytes: 0.01 10*3/uL (ref 0.00–0.07)
Basophils Absolute: 0 10*3/uL (ref 0.0–0.1)
Basophils Relative: 1 %
Eosinophils Absolute: 0.2 10*3/uL (ref 0.0–0.5)
Eosinophils Relative: 5 %
HCT: 33.7 % — ABNORMAL LOW (ref 36.0–46.0)
Hemoglobin: 11.2 g/dL — ABNORMAL LOW (ref 12.0–15.0)
Immature Granulocytes: 0 %
Lymphocytes Relative: 41 %
Lymphs Abs: 1.3 10*3/uL (ref 0.7–4.0)
MCH: 32.8 pg (ref 26.0–34.0)
MCHC: 33.2 g/dL (ref 30.0–36.0)
MCV: 98.8 fL (ref 80.0–100.0)
Monocytes Absolute: 0.2 10*3/uL (ref 0.1–1.0)
Monocytes Relative: 7 %
Neutro Abs: 1.5 10*3/uL — ABNORMAL LOW (ref 1.7–7.7)
Neutrophils Relative %: 46 %
Platelet Count: 161 10*3/uL (ref 150–400)
RBC: 3.41 MIL/uL — ABNORMAL LOW (ref 3.87–5.11)
RDW: 13 % (ref 11.5–15.5)
WBC Count: 3.3 10*3/uL — ABNORMAL LOW (ref 4.0–10.5)
nRBC: 0 % (ref 0.0–0.2)

## 2021-11-14 LAB — CMP (CANCER CENTER ONLY)
ALT: 108 U/L — ABNORMAL HIGH (ref 0–44)
AST: 64 U/L — ABNORMAL HIGH (ref 15–41)
Albumin: 4.2 g/dL (ref 3.5–5.0)
Alkaline Phosphatase: 73 U/L (ref 38–126)
Anion gap: 6 (ref 5–15)
BUN: 16 mg/dL (ref 6–20)
CO2: 28 mmol/L (ref 22–32)
Calcium: 9.5 mg/dL (ref 8.9–10.3)
Chloride: 106 mmol/L (ref 98–111)
Creatinine: 0.78 mg/dL (ref 0.44–1.00)
GFR, Estimated: >60 mL/min (ref >60)
Glucose, Bld: 85 mg/dL (ref 70–99)
Potassium: 4.4 mmol/L (ref 3.5–5.1)
Sodium: 140 mmol/L (ref 135–145)
Total Bilirubin: 0.4 mg/dL (ref 0.3–1.2)
Total Protein: 6.5 g/dL (ref 6.5–8.1)

## 2021-11-14 NOTE — Progress Notes (Signed)
[facility] OFFICE PROGRESS NOTE   Diagnosis: Gastrointestinal stromal tumor  INTERVAL HISTORY:   [name] returns as scheduled.  She continues Gleevec.  No swelling, rash, or diarrhea.  She reports constipation for the past few weeks.  No bleeding.  Objective:  Vital signs in last 24 hours:  Blood pressure 101/63, pulse 77, temperature 98.1 F (36.7 C), temperature source Oral, resp. rate 20, height _0  (1.575 m), weight 136 lb 12.8 oz (62.1 kg), SpO2 100 %.    HEENT: No thrush or ulcers Resp: Lungs clear bilaterally Cardio: Regular rate and rhythm GI: Nontender, no mass, no hepatosplenomegaly, soft Vascular: No leg edema  Skin: No rash   Lab Results:  Lab Results  Component Value Date   WBC 3.3 (L) 11/14/2021   HGB 11.2 (L) 11/14/2021   HCT 33.7 (L) 11/14/2021   MCV 98.8 11/14/2021   PLT 161 11/14/2021   NEUTROABS 1.5 (L) 11/14/2021    CMP  Lab Results  Component Value Date   NA 138 10/10/2021   K 4.5 10/10/2021   CL 105 10/10/2021   CO2 28 10/10/2021   GLUCOSE 84 10/10/2021   BUN 12 10/10/2021   CREATININE 0.67 10/10/2021   CALCIUM 9.4 10/10/2021   PROT 7.2 10/10/2021   ALBUMIN 4.5 10/10/2021   AST 32 10/10/2021   ALT 45 (H) 10/10/2021   ALKPHOS 82 10/10/2021   BILITOT 0.6 10/10/2021   GFRNONAA >60 10/10/2021   GFRAA >60 07/09/2020    Lab Results  Component Value Date   CEA1 0.7 02/18/2021    Lab Results  Component Value Date   INR 1.0 02/07/2021   LABPROT 13.1 02/07/2021    Imaging:  No results found.  Medications: I have reviewed the patient's current medications.   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  She is tolerating Gleevec well.  She has mild neutropenia.  She will call for a fever or symptoms of infection.  She will continue Gleevec at the current dose. She will call if the constipation does not improve.  She will try a stool softener.  [name], MD  11/14/2021  8:36 AM

## 2021-11-14 NOTE — Telephone Encounter (Signed)
Pt verbalized understanding. Lab appointment scheduled.

## 2021-11-14 NOTE — Telephone Encounter (Signed)
-----   Message from Ladell Pier, MD sent at 11/14/2021  1:29 PM EST ----- Please call patient, liver enzymes are mildly elevated, likely secondary to [place], schedule repeat CBC and CMP 2-3 weeks to be sure the liver enzymes are not steadily rising, will need to consider dose reduction of the [place] if the liver enzymes continue to rise

## 2021-12-03 ENCOUNTER — Other Ambulatory Visit: Payer: Self-pay | Admitting: Obstetrics and Gynecology

## 2021-12-03 ENCOUNTER — Encounter: Payer: Self-pay | Admitting: Oncology

## 2021-12-03 DIAGNOSIS — Z1231 Encounter for screening mammogram for malignant neoplasm of breast: Secondary | ICD-10-CM

## 2021-12-05 ENCOUNTER — Other Ambulatory Visit: Payer: Self-pay

## 2021-12-05 ENCOUNTER — Telehealth: Payer: Self-pay

## 2021-12-05 ENCOUNTER — Inpatient Hospital Stay: Payer: BC Managed Care – PPO | Attending: Oncology

## 2021-12-05 DIAGNOSIS — R748 Abnormal levels of other serum enzymes: Secondary | ICD-10-CM | POA: Insufficient documentation

## 2021-12-05 DIAGNOSIS — D702 Other drug-induced agranulocytosis: Secondary | ICD-10-CM | POA: Insufficient documentation

## 2021-12-05 DIAGNOSIS — R110 Nausea: Secondary | ICD-10-CM | POA: Diagnosis not present

## 2021-12-05 DIAGNOSIS — R630 Anorexia: Secondary | ICD-10-CM | POA: Insufficient documentation

## 2021-12-05 DIAGNOSIS — D6959 Other secondary thrombocytopenia: Secondary | ICD-10-CM | POA: Diagnosis not present

## 2021-12-05 DIAGNOSIS — Z79899 Other long term (current) drug therapy: Secondary | ICD-10-CM | POA: Insufficient documentation

## 2021-12-05 DIAGNOSIS — T451X5A Adverse effect of antineoplastic and immunosuppressive drugs, initial encounter: Secondary | ICD-10-CM | POA: Diagnosis not present

## 2021-12-05 DIAGNOSIS — D649 Anemia, unspecified: Secondary | ICD-10-CM | POA: Insufficient documentation

## 2021-12-05 DIAGNOSIS — C49A3 Gastrointestinal stromal tumor of small intestine: Secondary | ICD-10-CM | POA: Diagnosis present

## 2021-12-05 DIAGNOSIS — Z8616 Personal history of COVID-19: Secondary | ICD-10-CM | POA: Insufficient documentation

## 2021-12-05 LAB — CMP (CANCER CENTER ONLY)
ALT: 108 U/L — ABNORMAL HIGH (ref 0–44)
AST: 76 U/L — ABNORMAL HIGH (ref 15–41)
Albumin: 4.2 g/dL (ref 3.5–5.0)
Alkaline Phosphatase: 79 U/L (ref 38–126)
Anion gap: 5 (ref 5–15)
BUN: 12 mg/dL (ref 6–20)
CO2: 28 mmol/L (ref 22–32)
Calcium: 9.3 mg/dL (ref 8.9–10.3)
Chloride: 106 mmol/L (ref 98–111)
Creatinine: 0.78 mg/dL (ref 0.44–1.00)
GFR, Estimated: >60 mL/min (ref >60)
Glucose, Bld: 94 mg/dL (ref 70–99)
Potassium: 4.5 mmol/L (ref 3.5–5.1)
Sodium: 139 mmol/L (ref 135–145)
Total Bilirubin: 0.4 mg/dL (ref 0.3–1.2)
Total Protein: 6.3 g/dL — ABNORMAL LOW (ref 6.5–8.1)

## 2021-12-05 NOTE — Telephone Encounter (Signed)
-----   Message from Ladell Pier, MD sent at 12/05/2021  1:27 PM EST ----- Please call patient, liver enzymes are stable, continue Gleevec, follow-up as scheduled

## 2021-12-05 NOTE — Telephone Encounter (Signed)
Return call to Pt from My Chart message stating she has nausea and polydipsia. Pt asking  if this could be from elevated AST and ALT. Per Dr Benay Spice Pt is to hold Gleevec and increase fluids and give a return call to the office on Monday Also informed Pt he doesn't think these symptoms are from the elevated levels.  Pt verbalized understanding. No further problems or concerns noted.

## 2021-12-09 ENCOUNTER — Other Ambulatory Visit: Payer: Self-pay

## 2021-12-09 ENCOUNTER — Encounter: Payer: Self-pay | Admitting: Oncology

## 2021-12-09 ENCOUNTER — Ambulatory Visit
Admission: RE | Admit: 2021-12-09 | Discharge: 2021-12-09 | Disposition: A | Discharge status: Home / Self Care | Payer: BC Managed Care – PPO | Source: Ambulatory Visit | Attending: Obstetrics and Gynecology | Admitting: Obstetrics and Gynecology

## 2021-12-09 DIAGNOSIS — Z1231 Encounter for screening mammogram for malignant neoplasm of breast: Secondary | ICD-10-CM | POA: Insufficient documentation

## 2021-12-09 NOTE — Telephone Encounter (Signed)
Responding to my Chart message. Discussed with Pt how she felt since [place] was on hold Pt stated she felt good on Friday ate on Saturday but woke up with stomach pain. She stated she just ate lunch and waiting to see how she will feel.Discussed with Dr. Benay Spice who stated to inform Pt to hold [place] until Thursday to see how she feels. Pt verbalized understanding. No further problems or concerns.

## 2021-12-10 ENCOUNTER — Other Ambulatory Visit: Payer: Self-pay | Admitting: Oncology

## 2021-12-10 NOTE — Telephone Encounter (Signed)
Gleevec on hold till 12/12/21

## 2021-12-12 ENCOUNTER — Encounter: Payer: Self-pay | Admitting: Oncology

## 2021-12-12 ENCOUNTER — Other Ambulatory Visit: Payer: Self-pay

## 2021-12-12 ENCOUNTER — Telehealth: Payer: Self-pay

## 2021-12-12 NOTE — Telephone Encounter (Signed)
Pt left my chart message after holding [place] from 12/05/21-12/12/21. Pt states she is getting her appetite back and is feeling well. Per Dr Benay Spice Pt is restart Gleevec 300 mg a day. Sent message via my chart and will call to verbally inform Pt of this change.

## 2021-12-13 ENCOUNTER — Encounter: Payer: Self-pay | Admitting: Oncology

## 2021-12-13 NOTE — Telephone Encounter (Signed)
error 

## 2021-12-17 ENCOUNTER — Telehealth: Payer: Self-pay | Admitting: *Deleted

## 2021-12-17 NOTE — Telephone Encounter (Signed)
Returned call to pharmacy using case #[id number] CC. Requested clarification on imatinib dose. Confirmed she was dose reduced to 300 mg daily. The will continue to process the refill.

## 2021-12-19 ENCOUNTER — Other Ambulatory Visit: Payer: Self-pay

## 2021-12-19 ENCOUNTER — Inpatient Hospital Stay: Payer: BC Managed Care – PPO

## 2021-12-19 ENCOUNTER — Encounter: Payer: Self-pay | Admitting: Oncology

## 2021-12-19 ENCOUNTER — Inpatient Hospital Stay: Payer: BC Managed Care – PPO | Admitting: Oncology

## 2021-12-19 VITALS — BP 99/61 | HR 69 | Temp 97.7°F | Resp 18 | Ht 62.0 in | Wt 133.0 lb

## 2021-12-19 DIAGNOSIS — C49A3 Gastrointestinal stromal tumor of small intestine: Secondary | ICD-10-CM

## 2021-12-19 LAB — CBC WITH DIFFERENTIAL (CANCER CENTER ONLY)
Abs Immature Granulocytes: 0.01 10*3/uL (ref 0.00–0.07)
Basophils Absolute: 0 10*3/uL (ref 0.0–0.1)
Basophils Relative: 1 %
Eosinophils Absolute: 0.1 10*3/uL (ref 0.0–0.5)
Eosinophils Relative: 3 %
HCT: 34.8 % — ABNORMAL LOW (ref 36.0–46.0)
Hemoglobin: 11.4 g/dL — ABNORMAL LOW (ref 12.0–15.0)
Immature Granulocytes: 0 %
Lymphocytes Relative: 33 %
Lymphs Abs: 1.3 10*3/uL (ref 0.7–4.0)
MCH: 32.5 pg (ref 26.0–34.0)
MCHC: 32.8 g/dL (ref 30.0–36.0)
MCV: 99.1 fL (ref 80.0–100.0)
Monocytes Absolute: 0.3 10*3/uL (ref 0.1–1.0)
Monocytes Relative: 7 %
Neutro Abs: 2.2 10*3/uL (ref 1.7–7.7)
Neutrophils Relative %: 56 %
Platelet Count: 175 10*3/uL (ref 150–400)
RBC: 3.51 MIL/uL — ABNORMAL LOW (ref 3.87–5.11)
RDW: 12.9 % (ref 11.5–15.5)
WBC Count: 3.8 10*3/uL — ABNORMAL LOW (ref 4.0–10.5)
nRBC: 0 % (ref 0.0–0.2)

## 2021-12-19 LAB — CMP (CANCER CENTER ONLY)
ALT: 113 U/L — ABNORMAL HIGH (ref 0–44)
AST: 78 U/L — ABNORMAL HIGH (ref 15–41)
Albumin: 4.1 g/dL (ref 3.5–5.0)
Alkaline Phosphatase: 86 U/L (ref 38–126)
Anion gap: 6 (ref 5–15)
BUN: 16 mg/dL (ref 6–20)
CO2: 27 mmol/L (ref 22–32)
Calcium: 9.5 mg/dL (ref 8.9–10.3)
Chloride: 105 mmol/L (ref 98–111)
Creatinine: 0.76 mg/dL (ref 0.44–1.00)
GFR, Estimated: >60 mL/min (ref >60)
Glucose, Bld: 82 mg/dL (ref 70–99)
Potassium: 4.7 mmol/L (ref 3.5–5.1)
Sodium: 138 mmol/L (ref 135–145)
Total Bilirubin: 0.3 mg/dL (ref 0.3–1.2)
Total Protein: 6.4 g/dL — ABNORMAL LOW (ref 6.5–8.1)

## 2021-12-19 NOTE — Progress Notes (Signed)
[facility] OFFICE PROGRESS NOTE   Diagnosis: Gastrointestinal stromal tumor  INTERVAL HISTORY:   Paige Robertson returns as scheduled.  She developed nausea and anorexia earlier this month.  Gleevec was placed on hold 12/05/2021.  Her symptoms improved and Gleevec was resumed at a dose of 300 mg daily on 12/12/2021.  She continues to have mild nausea and anorexia.  No rash, fever, swelling, or diarrhea.  Objective:  Vital signs in last 24 hours:  Blood pressure 99/61, pulse 69, temperature 97.7 F (36.5 C), temperature source Oral, resp. rate 18, height $RemoveBe'5\' 2"'WHWsINCRv$  (1.575 m), weight 133 lb (60.3 kg), SpO2 100 %.    HEENT: No thrush or ulcers Resp: Distant breath sounds, no respiratory distress Cardio: Regular rate and rhythm GI: Nontender, no hepatosplenomegaly, no apparent ascites, no mass Vascular: No leg edema Neuro: No rash Skin: 2-3 mm cutaneous nodule lesions at the posterior neck inferior to the hairline   Lab Results:  Lab Results  Component Value Date   WBC 3.8 (L) 12/19/2021   HGB 11.4 (L) 12/19/2021   HCT 34.8 (L) 12/19/2021   MCV 99.1 12/19/2021   PLT 175 12/19/2021   NEUTROABS 2.2 12/19/2021    CMP  Lab Results  Component Value Date   NA 138 12/19/2021   K 4.7 12/19/2021   CL 105 12/19/2021   CO2 27 12/19/2021   GLUCOSE 82 12/19/2021   BUN 16 12/19/2021   CREATININE 0.76 12/19/2021   CALCIUM 9.5 12/19/2021   PROT 6.4 (L) 12/19/2021   ALBUMIN 4.1 12/19/2021   AST 78 (H) 12/19/2021   ALT 113 (H) 12/19/2021   ALKPHOS 86 12/19/2021   BILITOT 0.3 12/19/2021   GFRNONAA >60 12/19/2021   GFRAA >60 07/09/2020    Lab Results  Component Value Date   CEA1 0.7 02/18/2021    Medications: I have reviewed the patient's current medications.   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- 10.9 cm tumor, 1 mitosis per 5 mm, Ki67-less than 1%, CD117 positive, pT4pNx Gleevec starting  03/28/2021 Gleevec held beginning 05/01/2021 secondary to neutropenia Gleevec resumed 200 mg daily 05/06/2021 Gleevec escalated to 300 mg daily 06/20/2021 Gleevec escalated to 300 mg daily alternating with 400 mg daily 08/29/2021 Gleevec placed on hold 12/05/2021 secondary to nausea and anorexia, resumed at a dose of 300 mg daily 12/12/2021 Gleevec dose reduced to 200 mg daily 12/19/2021 secondary to nausea   History of anemia and iron deficiency secondary to bleeding from #1 Multiple hospital admissions with symptomatic anemia 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  She continues adjuvant Gleevec.  She has developed nausea and anorexia over the past month.  The liver enzymes are mildly elevated.  I suspect these findings are secondary to [place].  The [place] will be dose reduced to 200 mg daily.  She will return for an office visit in 3 weeks.  We will consider abdominal imaging if her symptoms persist.    Paige Coder, MD  12/19/2021  9:12 AM

## 2021-12-22 IMAGING — DX DG ABDOMEN 2V
4 series · 4 of 4 positions shown · non-contrast
Comparison: None.

CLINICAL DATA: Abdominal pain

EXAM:
ABDOMEN - 2 VIEW

[abdomen erect (1 of 3)]
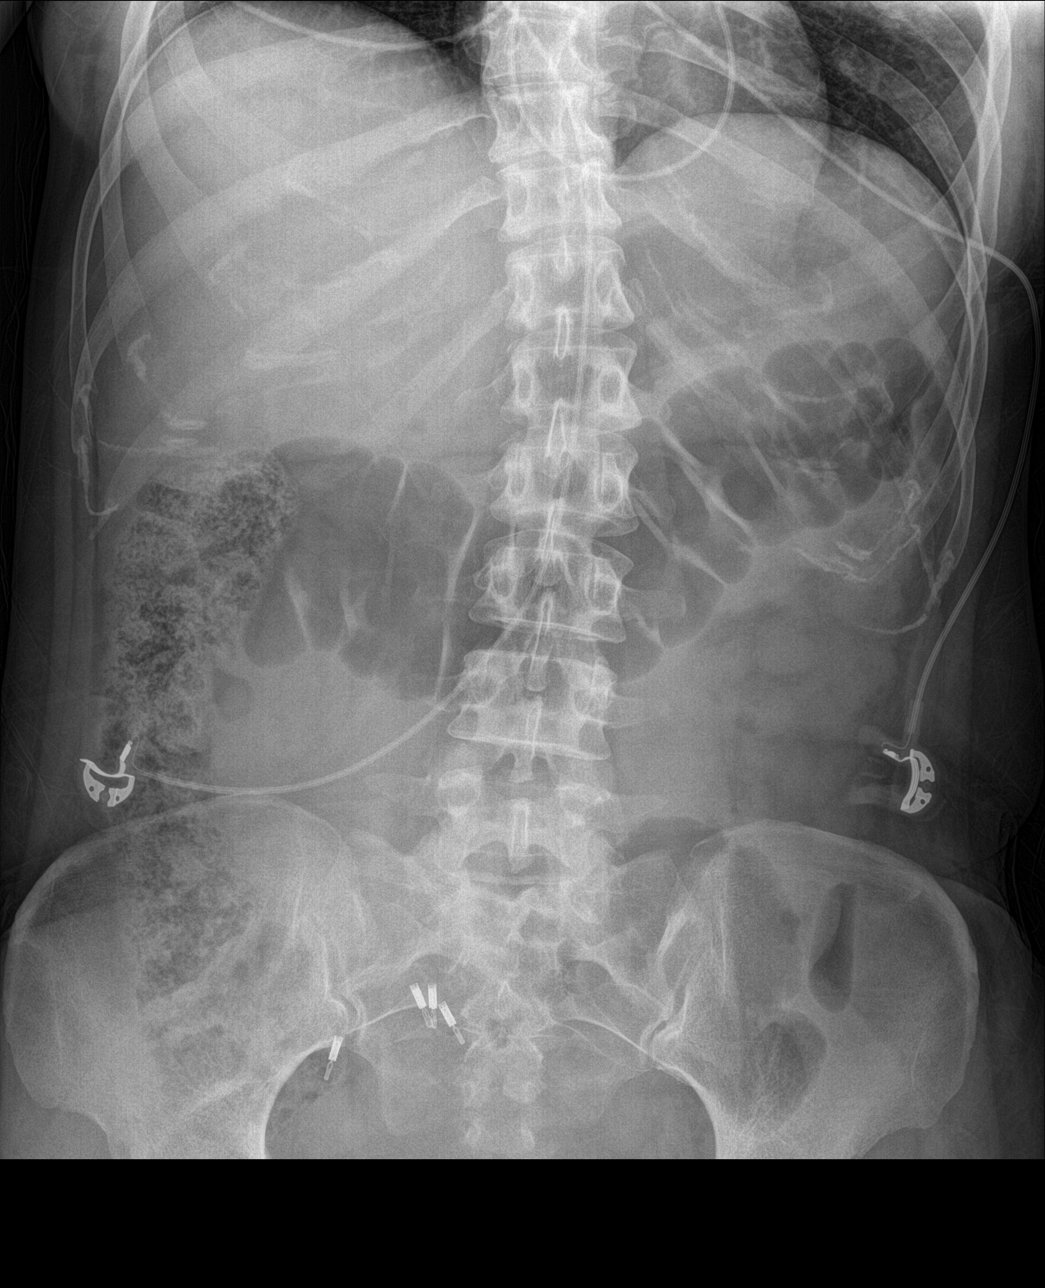

[abdomen supine]
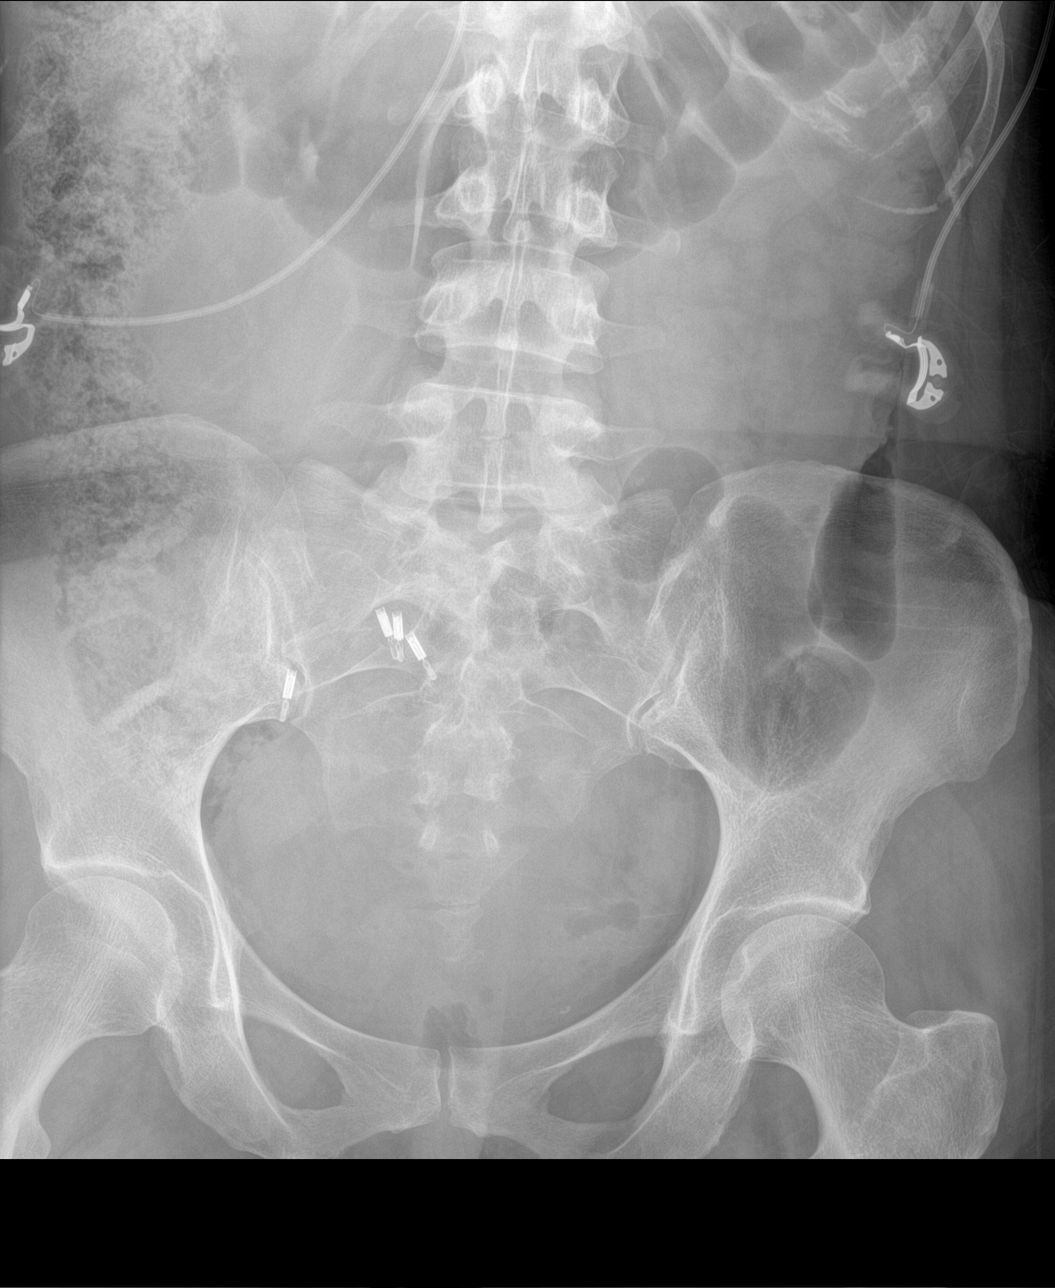

[abdomen erect (2 of 3)]
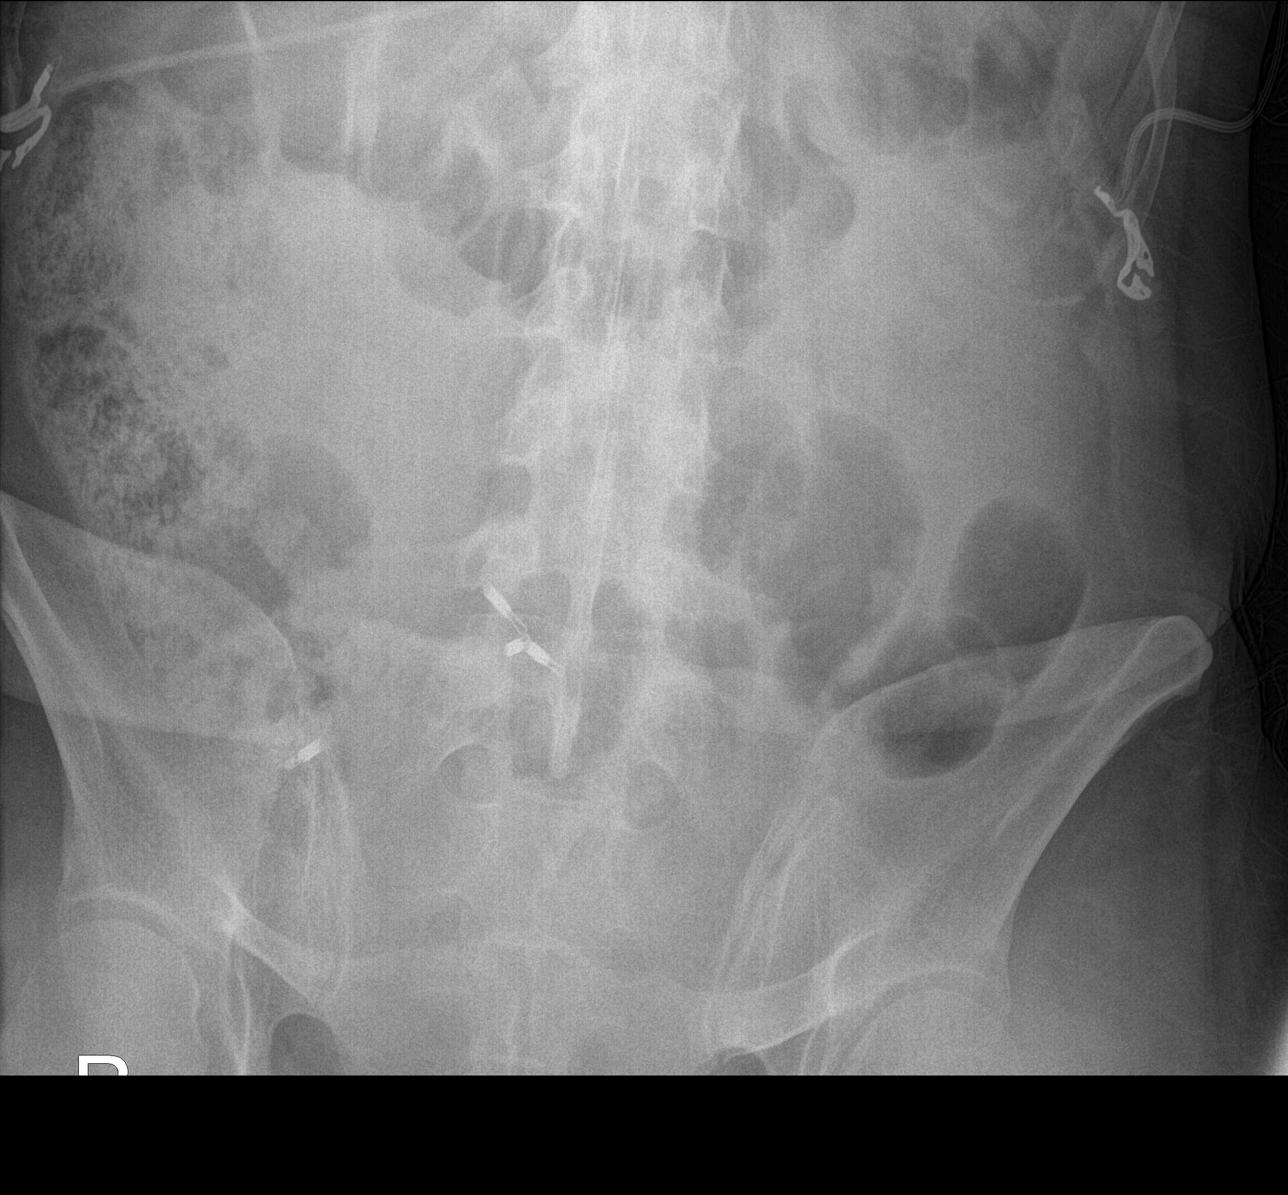

[abdomen erect (3 of 3)]
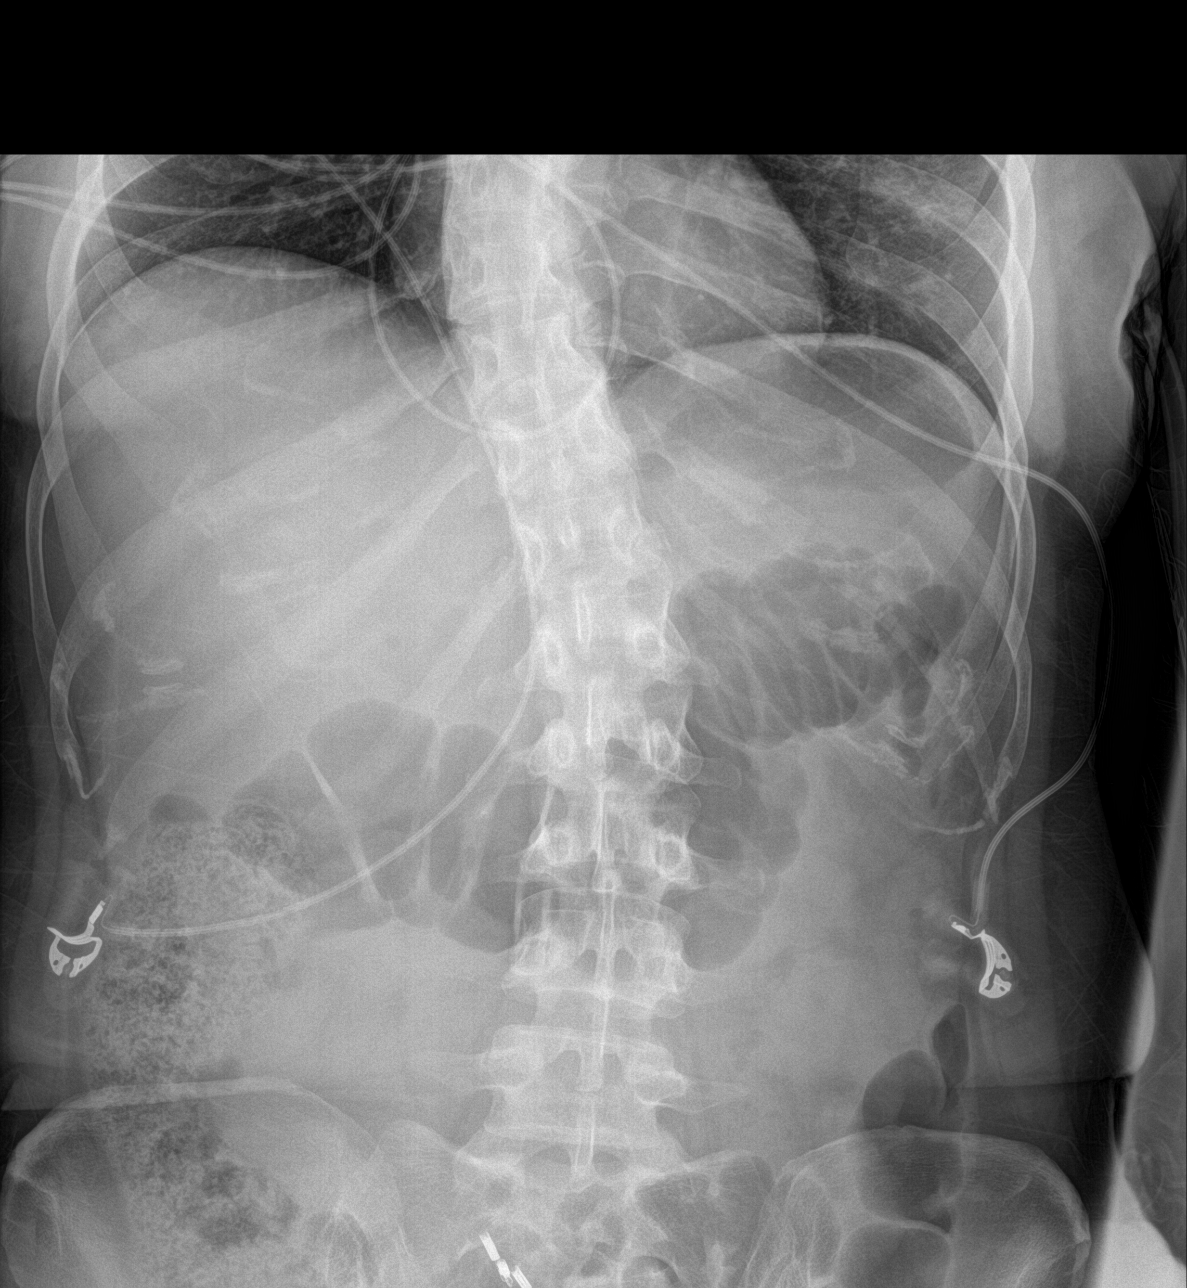

[4 of 4 positions shown; findings below may reference images not displayed]

FINDINGS: Metallic clips are seen in the upper pelvis, seen within jejunal
small bowel loops on prior CT, presumably endoscopic clips. Gas
within mildly prominent transverse colon. No small bowel dilatation.
No organomegaly or free air. No suspicious calcification.
IMPRESSION: No evidence of bowel obstruction or free air.

Clips seen in the upper abdomen, stage shown on prior CT to be
within proximal jejunal small bowel loops.

## 2022-01-10 ENCOUNTER — Inpatient Hospital Stay: Payer: BC Managed Care – PPO | Attending: Oncology | Admitting: Nurse Practitioner

## 2022-01-10 ENCOUNTER — Other Ambulatory Visit: Payer: Self-pay

## 2022-01-10 ENCOUNTER — Encounter: Payer: Self-pay | Admitting: Oncology

## 2022-01-10 ENCOUNTER — Inpatient Hospital Stay: Payer: BC Managed Care – PPO

## 2022-01-10 ENCOUNTER — Encounter: Payer: Self-pay | Admitting: Nurse Practitioner

## 2022-01-10 VITALS — BP 90/62 | HR 82 | Temp 98.2°F | Resp 18 | Ht 62.0 in | Wt 133.8 lb

## 2022-01-10 DIAGNOSIS — R748 Abnormal levels of other serum enzymes: Secondary | ICD-10-CM | POA: Diagnosis not present

## 2022-01-10 DIAGNOSIS — C49A3 Gastrointestinal stromal tumor of small intestine: Secondary | ICD-10-CM | POA: Insufficient documentation

## 2022-01-10 DIAGNOSIS — D500 Iron deficiency anemia secondary to blood loss (chronic): Secondary | ICD-10-CM | POA: Insufficient documentation

## 2022-01-10 DIAGNOSIS — Z79899 Other long term (current) drug therapy: Secondary | ICD-10-CM | POA: Insufficient documentation

## 2022-01-10 LAB — CBC WITH DIFFERENTIAL (CANCER CENTER ONLY)
Abs Immature Granulocytes: 0 10*3/uL (ref 0.00–0.07)
Basophils Absolute: 0 10*3/uL (ref 0.0–0.1)
Basophils Relative: 1 %
Eosinophils Absolute: 0.2 10*3/uL (ref 0.0–0.5)
Eosinophils Relative: 4 %
HCT: 34.9 % — ABNORMAL LOW (ref 36.0–46.0)
Hemoglobin: 11.5 g/dL — ABNORMAL LOW (ref 12.0–15.0)
Immature Granulocytes: 0 %
Lymphocytes Relative: 32 %
Lymphs Abs: 1.2 10*3/uL (ref 0.7–4.0)
MCH: 32.5 pg (ref 26.0–34.0)
MCHC: 33 g/dL (ref 30.0–36.0)
MCV: 98.6 fL (ref 80.0–100.0)
Monocytes Absolute: 0.2 10*3/uL (ref 0.1–1.0)
Monocytes Relative: 7 %
Neutro Abs: 2.1 10*3/uL (ref 1.7–7.7)
Neutrophils Relative %: 56 %
Platelet Count: 157 10*3/uL (ref 150–400)
RBC: 3.54 MIL/uL — ABNORMAL LOW (ref 3.87–5.11)
RDW: 12.7 % (ref 11.5–15.5)
WBC Count: 3.7 10*3/uL — ABNORMAL LOW (ref 4.0–10.5)
nRBC: 0 % (ref 0.0–0.2)

## 2022-01-10 LAB — CMP (CANCER CENTER ONLY)
ALT: 115 U/L — ABNORMAL HIGH (ref 0–44)
AST: 81 U/L — ABNORMAL HIGH (ref 15–41)
Albumin: 4.1 g/dL (ref 3.5–5.0)
Alkaline Phosphatase: 92 U/L (ref 38–126)
Anion gap: 6 (ref 5–15)
BUN: 11 mg/dL (ref 6–20)
CO2: 27 mmol/L (ref 22–32)
Calcium: 9.3 mg/dL (ref 8.9–10.3)
Chloride: 105 mmol/L (ref 98–111)
Creatinine: 0.68 mg/dL (ref 0.44–1.00)
GFR, Estimated: >60 mL/min (ref >60)
Glucose, Bld: 80 mg/dL (ref 70–99)
Potassium: 4.4 mmol/L (ref 3.5–5.1)
Sodium: 138 mmol/L (ref 135–145)
Total Bilirubin: 0.3 mg/dL (ref 0.3–1.2)
Total Protein: 6.5 g/dL (ref 6.5–8.1)

## 2022-01-10 NOTE — Progress Notes (Signed)
[facility] OFFICE PROGRESS NOTE   Diagnosis: Gastrointestinal stromal tumor  INTERVAL HISTORY:   Paige Robertson returns as scheduled.  Gleevec was dose reduced to 200 mg daily beginning 12/19/2021 due to nausea.  She is no longer having nausea.  No diarrhea.  No rash.  No edema.  She denies abdominal pain.  Menstrual cycle has been irregular for the past 2 months.  Objective:  Vital signs in last 24 hours:  Blood pressure 90/62, pulse 82, temperature 98.2 F (36.8 C), temperature source Oral, resp. rate 18, height $RemoveBe'5\' 2"'[id number]$  (1.575 m), weight 133 lb 12.8 oz (60.7 kg), SpO2 100 %.    HEENT: No thrush or ulcers. Resp: Lungs clear bilaterally. Cardio: Regular rate and rhythm. GI: Abdomen soft and nontender.  No hepatosplenomegaly.  No mass. Vascular: No leg edema. Skin: No rash.   Lab Results:  Lab Results  Component Value Date   WBC 3.7 (L) 01/10/2022   HGB 11.5 (L) 01/10/2022   HCT 34.9 (L) 01/10/2022   MCV 98.6 01/10/2022   PLT 157 01/10/2022   NEUTROABS 2.1 01/10/2022    Imaging:  No results found.  Medications: I have reviewed the patient's current medications.  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- 10.9 cm tumor, 1 mitosis per 5 mm, Ki67-less than 1%, CD117 positive, pT4pNx Gleevec starting 03/28/2021 Gleevec held beginning 05/01/2021 secondary to neutropenia Gleevec resumed 200 mg daily 05/06/2021 Gleevec escalated to 300 mg daily 06/20/2021 Gleevec escalated to 300 mg daily alternating with 400 mg daily 08/29/2021 Gleevec placed on hold 12/05/2021 secondary to nausea and anorexia, resumed at a dose of 300 mg daily 12/12/2021 Gleevec dose reduced to 200 mg daily 12/19/2021 secondary to nausea Gleevec 200 mg daily continued 01/10/2022   History of anemia and iron deficiency secondary to bleeding from #1 Multiple hospital admissions with symptomatic anemia 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  She is no longer having nausea.  She will continue Gleevec 200 mg daily.  We reviewed the CBC from today.  Counts adequate to continue Gleevec as above.  Liver enzymes have been elevated over the past few months.  We will follow-up on the chemistry panel from today.  The liver enzyme elevation is likely secondary to [place].  The irregular menstrual cycle could be due to [facility].  She will return for lab and follow-up in 1 month.  We are available to see her sooner if needed.   Ned Card ANP/GNP-BC   01/10/2022  8:39 AM

## 2022-01-15 ENCOUNTER — Other Ambulatory Visit (HOSPITAL_COMMUNITY): Payer: Self-pay

## 2022-02-04 ENCOUNTER — Other Ambulatory Visit: Payer: Self-pay | Admitting: Nurse Practitioner

## 2022-02-04 ENCOUNTER — Encounter: Payer: Self-pay | Admitting: Nurse Practitioner

## 2022-02-04 DIAGNOSIS — C49A3 Gastrointestinal stromal tumor of small intestine: Secondary | ICD-10-CM

## 2022-02-07 ENCOUNTER — Inpatient Hospital Stay: Payer: BC Managed Care – PPO

## 2022-02-07 ENCOUNTER — Telehealth: Payer: Self-pay

## 2022-02-07 ENCOUNTER — Inpatient Hospital Stay: Payer: BC Managed Care – PPO | Attending: Oncology | Admitting: Oncology

## 2022-02-07 ENCOUNTER — Other Ambulatory Visit: Payer: Self-pay

## 2022-02-07 VITALS — BP 96/64 | HR 70 | Temp 97.8°F | Resp 18 | Ht 62.0 in | Wt 133.8 lb

## 2022-02-07 DIAGNOSIS — D702 Other drug-induced agranulocytosis: Secondary | ICD-10-CM | POA: Insufficient documentation

## 2022-02-07 DIAGNOSIS — R5381 Other malaise: Secondary | ICD-10-CM | POA: Diagnosis not present

## 2022-02-07 DIAGNOSIS — C49A3 Gastrointestinal stromal tumor of small intestine: Secondary | ICD-10-CM | POA: Insufficient documentation

## 2022-02-07 DIAGNOSIS — T451X5A Adverse effect of antineoplastic and immunosuppressive drugs, initial encounter: Secondary | ICD-10-CM | POA: Diagnosis not present

## 2022-02-07 DIAGNOSIS — R748 Abnormal levels of other serum enzymes: Secondary | ICD-10-CM | POA: Insufficient documentation

## 2022-02-07 DIAGNOSIS — R232 Flushing: Secondary | ICD-10-CM | POA: Diagnosis not present

## 2022-02-07 DIAGNOSIS — D6959 Other secondary thrombocytopenia: Secondary | ICD-10-CM | POA: Insufficient documentation

## 2022-02-07 DIAGNOSIS — D649 Anemia, unspecified: Secondary | ICD-10-CM | POA: Diagnosis not present

## 2022-02-07 DIAGNOSIS — Z8616 Personal history of COVID-19: Secondary | ICD-10-CM | POA: Diagnosis not present

## 2022-02-07 DIAGNOSIS — Z79899 Other long term (current) drug therapy: Secondary | ICD-10-CM | POA: Insufficient documentation

## 2022-02-07 LAB — CBC WITH DIFFERENTIAL (CANCER CENTER ONLY)
Abs Immature Granulocytes: 0 10*3/uL (ref 0.00–0.07)
Basophils Absolute: 0 10*3/uL (ref 0.0–0.1)
Basophils Relative: 1 %
Eosinophils Absolute: 0.2 10*3/uL (ref 0.0–0.5)
Eosinophils Relative: 4 %
HCT: 34.2 % — ABNORMAL LOW (ref 36.0–46.0)
Hemoglobin: 11.3 g/dL — ABNORMAL LOW (ref 12.0–15.0)
Immature Granulocytes: 0 %
Lymphocytes Relative: 36 %
Lymphs Abs: 1.3 10*3/uL (ref 0.7–4.0)
MCH: 32.1 pg (ref 26.0–34.0)
MCHC: 33 g/dL (ref 30.0–36.0)
MCV: 97.2 fL (ref 80.0–100.0)
Monocytes Absolute: 0.3 10*3/uL (ref 0.1–1.0)
Monocytes Relative: 7 %
Neutro Abs: 1.9 10*3/uL (ref 1.7–7.7)
Neutrophils Relative %: 52 %
Platelet Count: 175 10*3/uL (ref 150–400)
RBC: 3.52 MIL/uL — ABNORMAL LOW (ref 3.87–5.11)
RDW: 12.4 % (ref 11.5–15.5)
WBC Count: 3.7 10*3/uL — ABNORMAL LOW (ref 4.0–10.5)
nRBC: 0 % (ref 0.0–0.2)

## 2022-02-07 LAB — CMP (CANCER CENTER ONLY)
ALT: 106 U/L — ABNORMAL HIGH (ref 0–44)
AST: 74 U/L — ABNORMAL HIGH (ref 15–41)
Albumin: 4.3 g/dL (ref 3.5–5.0)
Alkaline Phosphatase: 87 U/L (ref 38–126)
Anion gap: 6 (ref 5–15)
BUN: 12 mg/dL (ref 6–20)
CO2: 28 mmol/L (ref 22–32)
Calcium: 9.5 mg/dL (ref 8.9–10.3)
Chloride: 105 mmol/L (ref 98–111)
Creatinine: 0.75 mg/dL (ref 0.44–1.00)
GFR, Estimated: >60 mL/min (ref >60)
Glucose, Bld: 80 mg/dL (ref 70–99)
Potassium: 4.1 mmol/L (ref 3.5–5.1)
Sodium: 139 mmol/L (ref 135–145)
Total Bilirubin: 0.4 mg/dL (ref 0.3–1.2)
Total Protein: 6.8 g/dL (ref 6.5–8.1)

## 2022-02-07 NOTE — Progress Notes (Signed)
?  [facility] ?OFFICE PROGRESS NOTE ? ? ?Diagnosis: Gastrointestinal stromal tumor ? ?INTERVAL HISTORY:  ? ?Ms. Gsell returns as scheduled.  She continues Gleevec at a dose of 200 mg daily.  No rash, diarrhea, or swelling.  No abdominal pain.  She reports mild malaise.  She remains amenorrheic.  She had one hot flash. ? ?Objective: ? ?Vital signs in last 24 hours: ? ?Blood pressure 96/64, pulse 70, temperature 97.8 ?F (36.6 ?C), temperature source Oral, resp. rate 18, height $RemoveBe'5\' 2"'tWAioptDx$  (1.575 m), weight 133 lb 12.8 oz (60.7 kg), SpO2 100 %. ?  ? ?HEENT: No thrush or ulcers ?Resp: Lungs clear bilaterally ?Cardio: Regular rate and rhythm ?GI: Nontender, no mass, no hepatosplenomegaly ?Vascular: No leg edema  ?Skin: No rash ? ? ?Lab Results: ? ?Lab Results  ?Component Value Date  ? WBC 3.7 (L) 02/07/2022  ? HGB 11.3 (L) 02/07/2022  ? HCT 34.2 (L) 02/07/2022  ? MCV 97.2 02/07/2022  ? PLT 175 02/07/2022  ? NEUTROABS 1.9 02/07/2022  ? ? ?CMP  ?Lab Results  ?Component Value Date  ? NA 138 01/10/2022  ? K 4.4 01/10/2022  ? CL 105 01/10/2022  ? CO2 27 01/10/2022  ? GLUCOSE 80 01/10/2022  ? BUN 11 01/10/2022  ? CREATININE 0.68 01/10/2022  ? CALCIUM 9.3 01/10/2022  ? PROT 6.5 01/10/2022  ? ALBUMIN 4.1 01/10/2022  ? AST 81 (H) 01/10/2022  ? ALT 115 (H) 01/10/2022  ? ALKPHOS 92 01/10/2022  ? BILITOT 0.3 01/10/2022  ? GFRNONAA >60 01/10/2022  ? GFRAA >60 07/09/2020  ? ? ?Lab Results  ?Component Value Date  ? CEA1 0.7 02/18/2021  ? ? ? ? ?Medications: I have reviewed the patient's current medications. ? ? ?Assessment/Plan: ?Gastrointestinal stromal tumor of the jejunum ?Jejunal mass/small bowel resection 02/18/2021- 10.9 cm tumor, 1 mitosis per 5 mm?, Ki67-less than 1%, CD117 positive, pT4pNx ?[place] starting 03/28/2021 ?[place] held beginning 05/01/2021 secondary to neutropenia ?[place] resumed 200 mg daily 05/06/2021 ?Gleevec escalated to 300 mg daily 06/20/2021 ?Gleevec escalated to 300 mg daily alternating with 400 mg  daily 08/29/2021 ?[place] placed on hold 12/05/2021 secondary to nausea and anorexia, resumed at a dose of 300 mg daily 12/12/2021 ?Gleevec dose reduced to 200 mg daily 12/19/2021 secondary to nausea ?Gleevec 200 mg daily continued 01/10/2022 ?  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 ? ? ?Disposition: ?Paige Robertson appears stable.  She is tolerating Gleevec well at the current dose.  She will continue Gleevec at a dose of 200 mg daily.  We will follow-up on the liver panel from today. ? ?The amenorrhea may be related to early menopause or an effect of Gleevec.  We checked FSH, LH, and estradiol levels today. ? ?She will return for an office and lab visit in 6 weeks. ? ?[name], MD ? ?02/07/2022  ?8:07 AM ? ? ?

## 2022-02-07 NOTE — Telephone Encounter (Signed)
Pt verbalized understanding.

## 2022-02-07 NOTE — Telephone Encounter (Signed)
-----   Message from Ladell Pier, MD sent at 02/07/2022  1:58 PM EST ----- ?Please call patient, liver enzymes are mildly elevated and stable, continue Gleevec at current dose, follow-up as scheduled ? ?

## 2022-02-08 LAB — FOLLICLE STIMULATING HORMONE: FSH: 45.3 m[IU]/mL

## 2022-02-08 LAB — LUTEINIZING HORMONE: LH: 32.8 m[IU]/mL

## 2022-02-08 LAB — ESTRADIOL: Estradiol: 57.4 pg/mL

## 2022-02-10 ENCOUNTER — Other Ambulatory Visit: Payer: Self-pay | Admitting: *Deleted

## 2022-02-10 MED ORDER — IMATINIB MESYLATE 100 MG PO TABS: 200.0000 mg | ORAL_TABLET | Freq: Every day | ORAL | 1 refills | Status: DC

## 2022-02-10 MED ORDER — IMATINIB MESYLATE 100 MG PO TABS
200.0000 mg | ORAL_TABLET | Freq: Every day | ORAL | 1 refills | Status: DC
Start: 2022-02-10 — End: 2022-02-10

## 2022-02-10 NOTE — Telephone Encounter (Signed)
Incoming refill request from [facility] for Paige Robertson. Refill approved. ?

## 2022-02-13 ENCOUNTER — Telehealth: Payer: Self-pay

## 2022-02-13 NOTE — Telephone Encounter (Signed)
-----   Message from Owens Shark, NP sent at 02/12/2022  3:48 PM EDT ----- ?Please forward hormone level results from 02/07/2022 to her gynecologist and let Ms. Heatley know we are doing this. ? ?

## 2022-02-13 NOTE — Telephone Encounter (Signed)
Faxed over to # [phone] and called the patient to let her know her lab result was faxed over. Patient gave verbal understanding and denied further questions ?

## 2022-03-20 ENCOUNTER — Encounter: Payer: Self-pay | Admitting: Nurse Practitioner

## 2022-03-20 ENCOUNTER — Inpatient Hospital Stay: Payer: BC Managed Care – PPO | Attending: Oncology

## 2022-03-20 ENCOUNTER — Inpatient Hospital Stay: Payer: BC Managed Care – PPO | Admitting: Nurse Practitioner

## 2022-03-20 VITALS — BP 101/68 | HR 72 | Temp 98.1°F | Resp 18 | Ht 62.0 in | Wt 129.8 lb

## 2022-03-20 DIAGNOSIS — C49A3 Gastrointestinal stromal tumor of small intestine: Secondary | ICD-10-CM

## 2022-03-20 LAB — CMP (CANCER CENTER ONLY)
ALT: 58 U/L — ABNORMAL HIGH (ref 0–44)
AST: 47 U/L — ABNORMAL HIGH (ref 15–41)
Albumin: 4.6 g/dL (ref 3.5–5.0)
Alkaline Phosphatase: 77 U/L (ref 38–126)
Anion gap: 6 (ref 5–15)
BUN: 11 mg/dL (ref 6–20)
CO2: 27 mmol/L (ref 22–32)
Calcium: 10.3 mg/dL (ref 8.9–10.3)
Chloride: 104 mmol/L (ref 98–111)
Creatinine: 0.78 mg/dL (ref 0.44–1.00)
GFR, Estimated: >60 mL/min (ref >60)
Glucose, Bld: 85 mg/dL (ref 70–99)
Potassium: 4.3 mmol/L (ref 3.5–5.1)
Sodium: 137 mmol/L (ref 135–145)
Total Bilirubin: 0.6 mg/dL (ref 0.3–1.2)
Total Protein: 7.4 g/dL (ref 6.5–8.1)

## 2022-03-20 LAB — CBC WITH DIFFERENTIAL (CANCER CENTER ONLY)
Abs Immature Granulocytes: 0.01 10*3/uL (ref 0.00–0.07)
Basophils Absolute: 0 10*3/uL (ref 0.0–0.1)
Basophils Relative: 1 %
Eosinophils Absolute: 0.1 10*3/uL (ref 0.0–0.5)
Eosinophils Relative: 2 %
HCT: 37.1 % (ref 36.0–46.0)
Hemoglobin: 12.5 g/dL (ref 12.0–15.0)
Immature Granulocytes: 0 %
Lymphocytes Relative: 29 %
Lymphs Abs: 1.3 10*3/uL (ref 0.7–4.0)
MCH: 32.1 pg (ref 26.0–34.0)
MCHC: 33.7 g/dL (ref 30.0–36.0)
MCV: 95.1 fL (ref 80.0–100.0)
Monocytes Absolute: 0.4 10*3/uL (ref 0.1–1.0)
Monocytes Relative: 8 %
Neutro Abs: 2.8 10*3/uL (ref 1.7–7.7)
Neutrophils Relative %: 60 %
Platelet Count: 173 10*3/uL (ref 150–400)
RBC: 3.9 MIL/uL (ref 3.87–5.11)
RDW: 12.8 % (ref 11.5–15.5)
WBC Count: 4.5 10*3/uL (ref 4.0–10.5)
nRBC: 0 % (ref 0.0–0.2)

## 2022-03-20 NOTE — Progress Notes (Signed)
?  [facility] ?OFFICE PROGRESS NOTE ? ? ?Diagnosis: Gastrointestinal stromal tumor ? ?INTERVAL HISTORY:  ? ?Paige Robertson returns as scheduled.  She continues Gleevec 200 mg daily.  She denies nausea/vomiting.  No mouth sores.  No diarrhea.  No periorbital edema or leg edema.  She denies shortness of breath.  No rash.  Appetite is stable.  She reports being "tired".  She attributes this to being busy at work.  She noted abdominal fullness last week.  This has resolved.  She had a menstrual cycle in March. ? ?Objective: ? ?Vital signs in last 24 hours: ? ?Blood pressure 101/68, pulse 72, temperature 98.1 ?F (36.7 ?C), temperature source Oral, resp. rate 18, height $RemoveBe'5\' 2"'HFDTwtokX$  (1.575 m), weight 129 lb 12.8 oz (58.9 kg), SpO2 100 %. ?  ? ?HEENT: No thrush or ulcers. ?Resp: Lungs clear bilaterally. ?Cardio: Regular rate and rhythm. ?GI: Abdomen is soft.  Mild tenderness mid upper abdomen.  No hepatosplenomegaly.  No mass. ?Vascular: No leg edema. ?Skin: No rash. ? ? ?Lab Results: ? ?Lab Results  ?Component Value Date  ? WBC 4.5 03/20/2022  ? HGB 12.5 03/20/2022  ? HCT 37.1 03/20/2022  ? MCV 95.1 03/20/2022  ? PLT 173 03/20/2022  ? NEUTROABS 2.8 03/20/2022  ? ? ?Imaging: ? ?No results found. ? ?Medications: I have reviewed the patient's current medications. ? ?Assessment/Plan: ?Gastrointestinal stromal tumor of the jejunum ?Jejunal mass/small bowel resection 02/18/2021- 10.9 cm tumor, 1 mitosis per 5 mm?, Ki67-less than 1%, CD117 positive, pT4pNx ?[place] starting 03/28/2021 ?[place] held beginning 05/01/2021 secondary to neutropenia ?[place] resumed 200 mg daily 05/06/2021 ?Gleevec escalated to 300 mg daily 06/20/2021 ?Gleevec escalated to 300 mg daily alternating with 400 mg daily 08/29/2021 ?[place] placed on hold 12/05/2021 secondary to nausea and anorexia, resumed at a dose of 300 mg daily 12/12/2021 ?Gleevec dose reduced to 200 mg daily 12/19/2021 secondary to nausea ?Gleevec 200 mg daily continued 01/10/2022 ?  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  ?  ? ?Disposition: Paige Robertson appears stable.  She is tolerating the current dose of Gleevec well and will continue the same.  We reviewed the CBC from today.  Counts are adequate to continue Gleevec.  We will follow-up on the chemistry panel. ? ?She will return for lab and follow-up in 6 weeks.  We are available to see her sooner if needed. ? ? ? ?Ned Card ANP/GNP-BC  ? ?03/20/2022  ?8:14 AM ? ? ? ? ? ? ? ?

## 2022-04-03 ENCOUNTER — Other Ambulatory Visit: Payer: Self-pay | Admitting: Oncology

## 2022-05-01 ENCOUNTER — Inpatient Hospital Stay: Payer: BC Managed Care – PPO | Admitting: Oncology

## 2022-05-01 ENCOUNTER — Inpatient Hospital Stay: Payer: BC Managed Care – PPO | Attending: Oncology

## 2022-05-01 VITALS — BP 101/71 | HR 74 | Temp 98.2°F | Resp 18 | Ht 62.0 in | Wt 131.4 lb

## 2022-05-01 DIAGNOSIS — C49A3 Gastrointestinal stromal tumor of small intestine: Secondary | ICD-10-CM | POA: Diagnosis present

## 2022-05-01 DIAGNOSIS — Z79899 Other long term (current) drug therapy: Secondary | ICD-10-CM | POA: Diagnosis not present

## 2022-05-01 DIAGNOSIS — D649 Anemia, unspecified: Secondary | ICD-10-CM | POA: Diagnosis not present

## 2022-05-01 LAB — CBC WITH DIFFERENTIAL (CANCER CENTER ONLY)
Abs Immature Granulocytes: 0 10*3/uL (ref 0.00–0.07)
Basophils Absolute: 0 10*3/uL (ref 0.0–0.1)
Basophils Relative: 1 %
Eosinophils Absolute: 0.1 10*3/uL (ref 0.0–0.5)
Eosinophils Relative: 4 %
HCT: 37.3 % (ref 36.0–46.0)
Hemoglobin: 12.2 g/dL (ref 12.0–15.0)
Immature Granulocytes: 0 %
Lymphocytes Relative: 33 %
Lymphs Abs: 1.2 10*3/uL (ref 0.7–4.0)
MCH: 30.8 pg (ref 26.0–34.0)
MCHC: 32.7 g/dL (ref 30.0–36.0)
MCV: 94.2 fL (ref 80.0–100.0)
Monocytes Absolute: 0.2 10*3/uL (ref 0.1–1.0)
Monocytes Relative: 5 %
Neutro Abs: 2 10*3/uL (ref 1.7–7.7)
Neutrophils Relative %: 57 %
Platelet Count: 154 10*3/uL (ref 150–400)
RBC: 3.96 MIL/uL (ref 3.87–5.11)
RDW: 12.3 % (ref 11.5–15.5)
WBC Count: 3.6 10*3/uL — ABNORMAL LOW (ref 4.0–10.5)
nRBC: 0 % (ref 0.0–0.2)

## 2022-05-01 LAB — CMP (CANCER CENTER ONLY)
ALT: 116 U/L — ABNORMAL HIGH (ref 0–44)
AST: 83 U/L — ABNORMAL HIGH (ref 15–41)
Albumin: 4.3 g/dL (ref 3.5–5.0)
Alkaline Phosphatase: 202 U/L — ABNORMAL HIGH (ref 38–126)
Anion gap: 8 (ref 5–15)
BUN: 14 mg/dL (ref 6–20)
CO2: 30 mmol/L (ref 22–32)
Calcium: 10.2 mg/dL (ref 8.9–10.3)
Chloride: 101 mmol/L (ref 98–111)
Creatinine: 0.85 mg/dL (ref 0.44–1.00)
GFR, Estimated: >60 mL/min (ref >60)
Glucose, Bld: 87 mg/dL (ref 70–99)
Potassium: 4.5 mmol/L (ref 3.5–5.1)
Sodium: 139 mmol/L (ref 135–145)
Total Bilirubin: 0.5 mg/dL (ref 0.3–1.2)
Total Protein: 7.3 g/dL (ref 6.5–8.1)

## 2022-05-01 NOTE — Progress Notes (Signed)
  [facility] OFFICE PROGRESS NOTE   Diagnosis: Gastrointestinal stromal tumor  INTERVAL HISTORY:   Paige Robertson returns as scheduled.  She continues Gleevec.  No rash, nausea, diarrhea, or edema.  No complaint.  Objective:  Vital signs in last 24 hours:  Blood pressure 101/71, pulse 74, temperature 98.2 F (36.8 C), temperature source Oral, resp. rate 18, height $RemoveBe'5\' 2"'[id number]$  (1.575 m), weight 131 lb 6.4 oz (59.6 kg), SpO2 100 %.    HEENT: No thrush or ulcers Resp: Lungs clear bilaterally, slight decrease in breath sounds at the right compared to the left posterior chest, no respiratory distress Cardio: Regular rate and rhythm GI: No hepatosplenomegaly, nontender, no mass Vascular: No leg edema  Skin: No rash  Lab Results:  Lab Results  Component Value Date   WBC 3.6 (L) 05/01/2022   HGB 12.2 05/01/2022   HCT 37.3 05/01/2022   MCV 94.2 05/01/2022   PLT 154 05/01/2022   NEUTROABS 2.0 05/01/2022    CMP  Lab Results  Component Value Date   NA 139 05/01/2022   K 4.5 05/01/2022   CL 101 05/01/2022   CO2 30 05/01/2022   GLUCOSE 87 05/01/2022   BUN 14 05/01/2022   CREATININE 0.85 05/01/2022   CALCIUM 10.2 05/01/2022   PROT 7.3 05/01/2022   ALBUMIN 4.3 05/01/2022   AST 83 (H) 05/01/2022   ALT 116 (H) 05/01/2022   ALKPHOS 202 (H) 05/01/2022   BILITOT 0.5 05/01/2022   GFRNONAA >60 05/01/2022   GFRAA >60 07/09/2020    Lab Results  Component Value Date   CEA1 0.7 02/18/2021      Medications: I have reviewed the patient's current medications.   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- 10.9 cm tumor, 1 mitosis per 5 mm, Ki67-less than 1%, CD117 positive, pT4pNx Gleevec starting 03/28/2021 Gleevec held beginning 05/01/2021 secondary to neutropenia Gleevec resumed 200 mg daily 05/06/2021 Gleevec escalated to 300 mg daily 06/20/2021 Gleevec escalated to 300 mg daily alternating with 400  mg daily 08/29/2021 Gleevec placed on hold 12/05/2021 secondary to nausea and anorexia, resumed at a dose of 300 mg daily 12/12/2021 Gleevec dose reduced to 200 mg daily 12/19/2021 secondary to nausea Gleevec 200 mg daily continued 01/10/2022   History of anemia and iron deficiency secondary to bleeding from #1 Multiple hospital admissions with symptomatic anemia 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 Guirguis appears stable.  There is no clinical evidence for progression of the gastrointestinal stromal tumor.  The liver enzymes remain mildly elevated, likely secondary to [place].  The plan is to continue Gleevec at current dose.  She will return for an office and lab visit after her vacation in July.    [name], MD  05/01/2022  8:36 AM

## 2022-06-07 ENCOUNTER — Other Ambulatory Visit: Payer: Self-pay | Admitting: Oncology

## 2022-06-09 ENCOUNTER — Encounter: Payer: Self-pay | Admitting: Oncology

## 2022-06-19 ENCOUNTER — Inpatient Hospital Stay: Payer: BC Managed Care – PPO | Attending: Oncology

## 2022-06-19 ENCOUNTER — Inpatient Hospital Stay: Payer: BC Managed Care – PPO | Admitting: Oncology

## 2022-06-19 VITALS — BP 95/55 | HR 61 | Temp 97.9°F | Resp 16 | Ht 62.0 in | Wt 130.8 lb

## 2022-06-19 DIAGNOSIS — C49A3 Gastrointestinal stromal tumor of small intestine: Secondary | ICD-10-CM

## 2022-06-19 DIAGNOSIS — Z79899 Other long term (current) drug therapy: Secondary | ICD-10-CM | POA: Diagnosis not present

## 2022-06-19 DIAGNOSIS — D649 Anemia, unspecified: Secondary | ICD-10-CM | POA: Insufficient documentation

## 2022-06-19 LAB — CMP (CANCER CENTER ONLY)
ALT: 52 U/L — ABNORMAL HIGH (ref 0–44)
AST: 48 U/L — ABNORMAL HIGH (ref 15–41)
Albumin: 4.5 g/dL (ref 3.5–5.0)
Alkaline Phosphatase: 72 U/L (ref 38–126)
Anion gap: 7 (ref 5–15)
BUN: 17 mg/dL (ref 6–20)
CO2: 29 mmol/L (ref 22–32)
Calcium: 10.1 mg/dL (ref 8.9–10.3)
Chloride: 105 mmol/L (ref 98–111)
Creatinine: 0.77 mg/dL (ref 0.44–1.00)
GFR, Estimated: >60 mL/min (ref >60)
Glucose, Bld: 85 mg/dL (ref 70–99)
Potassium: 4.7 mmol/L (ref 3.5–5.1)
Sodium: 141 mmol/L (ref 135–145)
Total Bilirubin: 0.4 mg/dL (ref 0.3–1.2)
Total Protein: 6.9 g/dL (ref 6.5–8.1)

## 2022-06-19 LAB — CBC WITH DIFFERENTIAL (CANCER CENTER ONLY)
Abs Immature Granulocytes: 0 10*3/uL (ref 0.00–0.07)
Basophils Absolute: 0 10*3/uL (ref 0.0–0.1)
Basophils Relative: 1 %
Eosinophils Absolute: 0.2 10*3/uL (ref 0.0–0.5)
Eosinophils Relative: 4 %
HCT: 35.7 % — ABNORMAL LOW (ref 36.0–46.0)
Hemoglobin: 12 g/dL (ref 12.0–15.0)
Immature Granulocytes: 0 %
Lymphocytes Relative: 37 %
Lymphs Abs: 1.4 10*3/uL (ref 0.7–4.0)
MCH: 32.5 pg (ref 26.0–34.0)
MCHC: 33.6 g/dL (ref 30.0–36.0)
MCV: 96.7 fL (ref 80.0–100.0)
Monocytes Absolute: 0.3 10*3/uL (ref 0.1–1.0)
Monocytes Relative: 7 %
Neutro Abs: 2 10*3/uL (ref 1.7–7.7)
Neutrophils Relative %: 51 %
Platelet Count: 133 10*3/uL — ABNORMAL LOW (ref 150–400)
RBC: 3.69 MIL/uL — ABNORMAL LOW (ref 3.87–5.11)
RDW: 13.1 % (ref 11.5–15.5)
WBC Count: 3.9 10*3/uL — ABNORMAL LOW (ref 4.0–10.5)
nRBC: 0 % (ref 0.0–0.2)

## 2022-06-19 NOTE — Progress Notes (Signed)
  [facility] OFFICE PROGRESS NOTE   Diagnosis: Gastrointestinal stromal tumor  INTERVAL HISTORY:   Paige Robertson returns as scheduled.  She continues Gleevec.  No rash, diarrhea, or nausea.  She had mid abdominal pain last week while on vacation.  No dysuria or associated symptoms.  The pain has improved.  She reports swelling at the ankles in the hot weather.  Objective:  Vital signs in last 24 hours:  Blood pressure (!) 95/55, pulse 61, temperature 97.9 F (36.6 C), temperature source Oral, resp. rate 16, height $RemoveBe'5\' 2"'nQDMfBwtr$  (1.575 m), weight 130 lb 12.8 oz (59.3 kg), SpO2 100 %.    HEENT: No thrush or ulcers Resp: Lungs clear bilaterally, distant breath sounds Cardio: Regular rate and rhythm GI: No hepatosplenomegaly, no mass, no apparent ascites, nontender Vascular: No leg edema  Skin: No rash   Lab Results:  Lab Results  Component Value Date   WBC 3.9 (L) 06/19/2022   HGB 12.0 06/19/2022   HCT 35.7 (L) 06/19/2022   MCV 96.7 06/19/2022   PLT 133 (L) 06/19/2022   NEUTROABS 2.0 06/19/2022    CMP  Lab Results  Component Value Date   NA 139 05/01/2022   K 4.5 05/01/2022   CL 101 05/01/2022   CO2 30 05/01/2022   GLUCOSE 87 05/01/2022   BUN 14 05/01/2022   CREATININE 0.85 05/01/2022   CALCIUM 10.2 05/01/2022   PROT 7.3 05/01/2022   ALBUMIN 4.3 05/01/2022   AST 83 (H) 05/01/2022   ALT 116 (H) 05/01/2022   ALKPHOS 202 (H) 05/01/2022   BILITOT 0.5 05/01/2022   GFRNONAA >60 05/01/2022   GFRAA >60 07/09/2020      Medications: I have reviewed the patient's current medications.   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- 10.9 cm tumor, 1 mitosis per 5 mm, Ki67-less than 1%, CD117 positive, pT4pNx Gleevec starting 03/28/2021 Gleevec held beginning 05/01/2021 secondary to neutropenia Gleevec resumed 200 mg daily 05/06/2021 Gleevec escalated to 300 mg daily 06/20/2021 Gleevec escalated to  300 mg daily alternating with 400 mg daily 08/29/2021 Gleevec placed on hold 12/05/2021 secondary to nausea and anorexia, resumed at a dose of 300 mg daily 12/12/2021 Gleevec dose reduced to 200 mg daily 12/19/2021 secondary to nausea Gleevec 200 mg daily continued 01/10/2022   History of anemia and iron deficiency secondary to bleeding from #1 Multiple hospital admissions with symptomatic anemia 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 appears stable.  She will continue Gleevec at the current dose.  She will return for an office and lab visit in 6 weeks.  I suspect the abdominal pain is unrelated to [place] and the gastrointestinal stromal tumor.  She will call for persistent pain. We will follow-up on the liver panel from today.  Paige Coder, MD  06/19/2022  8:15 AM

## 2022-07-31 ENCOUNTER — Inpatient Hospital Stay: Payer: BC Managed Care – PPO | Attending: Oncology

## 2022-07-31 ENCOUNTER — Inpatient Hospital Stay: Payer: BC Managed Care – PPO | Admitting: Nurse Practitioner

## 2022-07-31 ENCOUNTER — Encounter: Payer: Self-pay | Admitting: Nurse Practitioner

## 2022-07-31 VITALS — BP 92/62 | HR 59 | Temp 97.8°F | Resp 16 | Wt 133.2 lb

## 2022-07-31 DIAGNOSIS — D649 Anemia, unspecified: Secondary | ICD-10-CM | POA: Diagnosis not present

## 2022-07-31 DIAGNOSIS — C49A3 Gastrointestinal stromal tumor of small intestine: Secondary | ICD-10-CM | POA: Diagnosis present

## 2022-07-31 LAB — CMP (CANCER CENTER ONLY)
ALT: 47 U/L — ABNORMAL HIGH (ref 0–44)
AST: 46 U/L — ABNORMAL HIGH (ref 15–41)
Albumin: 4.1 g/dL (ref 3.5–5.0)
Alkaline Phosphatase: 71 U/L (ref 38–126)
Anion gap: 6 (ref 5–15)
BUN: 11 mg/dL (ref 6–20)
CO2: 29 mmol/L (ref 22–32)
Calcium: 9.6 mg/dL (ref 8.9–10.3)
Chloride: 100 mmol/L (ref 98–111)
Creatinine: 0.81 mg/dL (ref 0.44–1.00)
GFR, Estimated: >60 mL/min (ref >60)
Glucose, Bld: 84 mg/dL (ref 70–99)
Potassium: 4.6 mmol/L (ref 3.5–5.1)
Sodium: 135 mmol/L (ref 135–145)
Total Bilirubin: 0.4 mg/dL (ref 0.3–1.2)
Total Protein: 6.6 g/dL (ref 6.5–8.1)

## 2022-07-31 LAB — CBC WITH DIFFERENTIAL (CANCER CENTER ONLY)
Abs Immature Granulocytes: 0.01 10*3/uL (ref 0.00–0.07)
Basophils Absolute: 0 10*3/uL (ref 0.0–0.1)
Basophils Relative: 1 %
Eosinophils Absolute: 0.1 10*3/uL (ref 0.0–0.5)
Eosinophils Relative: 3 %
HCT: 34.8 % — ABNORMAL LOW (ref 36.0–46.0)
Hemoglobin: 11.8 g/dL — ABNORMAL LOW (ref 12.0–15.0)
Immature Granulocytes: 0 %
Lymphocytes Relative: 30 %
Lymphs Abs: 1.3 10*3/uL (ref 0.7–4.0)
MCH: 32.7 pg (ref 26.0–34.0)
MCHC: 33.9 g/dL (ref 30.0–36.0)
MCV: 96.4 fL (ref 80.0–100.0)
Monocytes Absolute: 0.4 10*3/uL (ref 0.1–1.0)
Monocytes Relative: 8 %
Neutro Abs: 2.6 10*3/uL (ref 1.7–7.7)
Neutrophils Relative %: 58 %
Platelet Count: 170 10*3/uL (ref 150–400)
RBC: 3.61 MIL/uL — ABNORMAL LOW (ref 3.87–5.11)
RDW: 13 % (ref 11.5–15.5)
WBC Count: 4.4 10*3/uL (ref 4.0–10.5)
nRBC: 0 % (ref 0.0–0.2)

## 2022-07-31 NOTE — Progress Notes (Signed)
  [facility] OFFICE PROGRESS NOTE   Diagnosis: Gastrointestinal stromal tumor  INTERVAL HISTORY:   Paige Robertson returns as scheduled.  She continues Gleevec.  No nausea/vomiting.  No mouth sores.  No diarrhea.  No rash.  No periorbital or leg edema at present.  She notes some edema at the ankles in hot weather.  No further abdominal pain.   Objective:  Vital signs in last 24 hours:  Blood pressure 92/62, pulse (!) 59, temperature 97.8 F (36.6 C), temperature source Oral, resp. rate 16, weight 133 lb 3.2 oz (60.4 kg), SpO2 100 %.    HEENT: No thrush or ulcers. Resp: Lungs clear bilaterally, distant breath sounds.  No respiratory distress. Cardio: Regular rate and rhythm. GI: Abdomen soft and nontender.  No hepatosplenomegaly.  No mass. Vascular: No leg edema. Skin: No rash.   Lab Results:  Lab Results  Component Value Date   WBC 4.4 07/31/2022   HGB 11.8 (L) 07/31/2022   HCT 34.8 (L) 07/31/2022   MCV 96.4 07/31/2022   PLT 170 07/31/2022   NEUTROABS 2.6 07/31/2022    Imaging:  No results found.  Medications: I have reviewed the patient's current medications.  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- 10.9 cm tumor, 1 mitosis per 5 mm, Ki67-less than 1%, CD117 positive, pT4pNx Gleevec starting 03/28/2021 Gleevec held beginning 05/01/2021 secondary to neutropenia Gleevec resumed 200 mg daily 05/06/2021 Gleevec escalated to 300 mg daily 06/20/2021 Gleevec escalated to 300 mg daily alternating with 400 mg daily 08/29/2021 Gleevec placed on hold 12/05/2021 secondary to nausea and anorexia, resumed at a dose of 300 mg daily 12/12/2021 Gleevec dose reduced to 200 mg daily 12/19/2021 secondary to nausea Gleevec 200 mg daily continued 01/10/2022   History of anemia and iron deficiency secondary to bleeding from #1 Multiple hospital admissions with symptomatic anemia Proximal duodenal and jejunal AVMs  treated with APC and Hemoclip Neutropenia/thrombocytopenia secondary to [place] on 05/01/2021 COVID-19 infection 05/18/2021 Elevated liver enzymes beginning November 2022-likely secondary to [place]    Disposition: Ms. Paige Robertson appears stable.  She is tolerating Gleevec 200 mg daily well.  Plan to continue [place] at the same dose.  CBC from today reviewed.  Counts adequate to continue Gleevec.  We will follow-up on the chemistry panel.  She will return for lab and follow-up in 6 weeks.  Ned Card ANP/GNP-BC   07/31/2022  8:36 AM

## 2022-09-09 ENCOUNTER — Inpatient Hospital Stay: Payer: BC Managed Care – PPO | Attending: Oncology

## 2022-09-09 ENCOUNTER — Inpatient Hospital Stay: Payer: BC Managed Care – PPO | Admitting: Oncology

## 2022-09-09 VITALS — BP 99/63 | HR 73 | Temp 98.2°F | Resp 18 | Ht 62.0 in | Wt 132.6 lb

## 2022-09-09 DIAGNOSIS — C49A3 Gastrointestinal stromal tumor of small intestine: Secondary | ICD-10-CM | POA: Insufficient documentation

## 2022-09-09 DIAGNOSIS — Z79899 Other long term (current) drug therapy: Secondary | ICD-10-CM | POA: Insufficient documentation

## 2022-09-09 LAB — CMP (CANCER CENTER ONLY)
ALT: 31 U/L (ref 0–44)
AST: 32 U/L (ref 15–41)
Albumin: 4.4 g/dL (ref 3.5–5.0)
Alkaline Phosphatase: 64 U/L (ref 38–126)
Anion gap: 5 (ref 5–15)
BUN: 15 mg/dL (ref 6–20)
CO2: 28 mmol/L (ref 22–32)
Calcium: 9.9 mg/dL (ref 8.9–10.3)
Chloride: 106 mmol/L (ref 98–111)
Creatinine: 0.86 mg/dL (ref 0.44–1.00)
GFR, Estimated: >60 mL/min (ref >60)
Glucose, Bld: 96 mg/dL (ref 70–99)
Potassium: 4.3 mmol/L (ref 3.5–5.1)
Sodium: 139 mmol/L (ref 135–145)
Total Bilirubin: 0.3 mg/dL (ref 0.3–1.2)
Total Protein: 7.4 g/dL (ref 6.5–8.1)

## 2022-09-09 LAB — CBC WITH DIFFERENTIAL (CANCER CENTER ONLY)
Abs Immature Granulocytes: 0.01 10*3/uL (ref 0.00–0.07)
Basophils Absolute: 0 10*3/uL (ref 0.0–0.1)
Basophils Relative: 1 %
Eosinophils Absolute: 0.1 10*3/uL (ref 0.0–0.5)
Eosinophils Relative: 2 %
HCT: 36.3 % (ref 36.0–46.0)
Hemoglobin: 12.3 g/dL (ref 12.0–15.0)
Immature Granulocytes: 0 %
Lymphocytes Relative: 28 %
Lymphs Abs: 1.2 10*3/uL (ref 0.7–4.0)
MCH: 33 pg (ref 26.0–34.0)
MCHC: 33.9 g/dL (ref 30.0–36.0)
MCV: 97.3 fL (ref 80.0–100.0)
Monocytes Absolute: 0.3 10*3/uL (ref 0.1–1.0)
Monocytes Relative: 6 %
Neutro Abs: 2.7 10*3/uL (ref 1.7–7.7)
Neutrophils Relative %: 63 %
Platelet Count: 173 10*3/uL (ref 150–400)
RBC: 3.73 MIL/uL — ABNORMAL LOW (ref 3.87–5.11)
RDW: 13 % (ref 11.5–15.5)
WBC Count: 4.3 10*3/uL (ref 4.0–10.5)
nRBC: 0 % (ref 0.0–0.2)

## 2022-09-09 NOTE — Progress Notes (Signed)
  [facility] OFFICE PROGRESS NOTE   Diagnosis: Gastrointestinal stromal tumor  INTERVAL HISTORY:   Paige Robertson returns as scheduled.  She continues Gleevec.  No rash, swelling, or diarrhea.  She reports malaise.  She had 2 heavy menstrual cycles a few weeks apart after not having a cycle for several months.  Objective:  Vital signs in last 24 hours:  Blood pressure 99/63, pulse 73, temperature 98.2 F (36.8 C), temperature source Oral, resp. rate 18, height 5' 2" (1.575 m), weight 132 lb 9.6 oz (60.1 kg), SpO2 100 %.    HEENT: No thrush or ulcers Resp: Lungs clear bilaterally, distant breath sounds Cardio: Regular rate and rhythm GI: No mass, no hepatosplenomegaly, nontender Vascular: No leg edema  Skin: No rash  Lab Results:  Lab Results  Component Value Date   WBC 4.3 09/09/2022   HGB 12.3 09/09/2022   HCT 36.3 09/09/2022   MCV 97.3 09/09/2022   PLT 173 09/09/2022   NEUTROABS 2.7 09/09/2022    CMP  Lab Results  Component Value Date   NA 135 07/31/2022   K 4.6 07/31/2022   CL 100 07/31/2022   CO2 29 07/31/2022   GLUCOSE 84 07/31/2022   BUN 11 07/31/2022   CREATININE 0.81 07/31/2022   CALCIUM 9.6 07/31/2022   PROT 6.6 07/31/2022   ALBUMIN 4.1 07/31/2022   AST 46 (H) 07/31/2022   ALT 47 (H) 07/31/2022   ALKPHOS 71 07/31/2022   BILITOT 0.4 07/31/2022   GFRNONAA >60 07/31/2022   GFRAA >60 07/09/2020    Lab Results  Component Value Date   CEA1 0.7 02/18/2021     Medications: I have reviewed the patient's current medications.   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- 10.9 cm tumor, 1 mitosis per 5 mm, Ki67-less than 1%, CD117 positive, pT4pNx Gleevec starting 03/28/2021 Gleevec held beginning 05/01/2021 secondary to neutropenia Gleevec resumed 200 mg daily 05/06/2021 Gleevec escalated to 300 mg daily 06/20/2021 Gleevec escalated to 300 mg daily alternating with 400 mg  daily 08/29/2021 Gleevec placed on hold 12/05/2021 secondary to nausea and anorexia, resumed at a dose of 300 mg daily 12/12/2021 Gleevec dose reduced to 200 mg daily 12/19/2021 secondary to nausea Gleevec 200 mg daily continued 01/10/2022   History of anemia and iron deficiency secondary to bleeding from #1 Multiple hospital admissions with symptomatic anemia 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  She appears to be tolerating the [place] well.  She will continue Gleevec at the current dose.  The etiology of her malaise is unclear.  I doubt this is related to [place] or the history of a gastrointestinal stromal tumor.  The irregular menses may be related to [place].  We will follow-up on the chemistry panel from today.  She will return for an office and lab visit in 2 months.  [name], MD  09/09/2022  8:38 AM

## 2022-09-11 ENCOUNTER — Ambulatory Visit: Payer: BC Managed Care – PPO | Admitting: Oncology

## 2022-09-11 ENCOUNTER — Other Ambulatory Visit: Payer: BC Managed Care – PPO

## 2022-10-03 ENCOUNTER — Other Ambulatory Visit: Payer: Self-pay | Admitting: Oncology

## 2022-11-06 ENCOUNTER — Inpatient Hospital Stay: Payer: BC Managed Care – PPO | Attending: Oncology

## 2022-11-06 ENCOUNTER — Encounter: Payer: Self-pay | Admitting: Nurse Practitioner

## 2022-11-06 ENCOUNTER — Inpatient Hospital Stay: Payer: BC Managed Care – PPO | Admitting: Nurse Practitioner

## 2022-11-06 VITALS — BP 92/62 | HR 60 | Temp 98.3°F | Resp 16 | Wt 132.8 lb

## 2022-11-06 DIAGNOSIS — Z79899 Other long term (current) drug therapy: Secondary | ICD-10-CM | POA: Diagnosis not present

## 2022-11-06 DIAGNOSIS — C49A3 Gastrointestinal stromal tumor of small intestine: Secondary | ICD-10-CM | POA: Diagnosis not present

## 2022-11-06 DIAGNOSIS — D649 Anemia, unspecified: Secondary | ICD-10-CM | POA: Insufficient documentation

## 2022-11-06 DIAGNOSIS — R748 Abnormal levels of other serum enzymes: Secondary | ICD-10-CM | POA: Insufficient documentation

## 2022-11-06 LAB — CMP (CANCER CENTER ONLY)
ALT: 81 U/L — ABNORMAL HIGH (ref 0–44)
AST: 71 U/L — ABNORMAL HIGH (ref 15–41)
Albumin: 4.5 g/dL (ref 3.5–5.0)
Alkaline Phosphatase: 77 U/L (ref 38–126)
Anion gap: 8 (ref 5–15)
BUN: 20 mg/dL (ref 6–20)
CO2: 28 mmol/L (ref 22–32)
Calcium: 9.7 mg/dL (ref 8.9–10.3)
Chloride: 105 mmol/L (ref 98–111)
Creatinine: 0.77 mg/dL (ref 0.44–1.00)
GFR, Estimated: >60 mL/min (ref >60)
Glucose, Bld: 96 mg/dL (ref 70–99)
Potassium: 4.5 mmol/L (ref 3.5–5.1)
Sodium: 141 mmol/L (ref 135–145)
Total Bilirubin: 0.3 mg/dL (ref 0.3–1.2)
Total Protein: 7.1 g/dL (ref 6.5–8.1)

## 2022-11-06 LAB — CBC WITH DIFFERENTIAL (CANCER CENTER ONLY)
Abs Immature Granulocytes: 0.01 10*3/uL (ref 0.00–0.07)
Basophils Absolute: 0 10*3/uL (ref 0.0–0.1)
Basophils Relative: 1 %
Eosinophils Absolute: 0.1 10*3/uL (ref 0.0–0.5)
Eosinophils Relative: 4 %
HCT: 36.9 % (ref 36.0–46.0)
Hemoglobin: 12.3 g/dL (ref 12.0–15.0)
Immature Granulocytes: 0 %
Lymphocytes Relative: 40 %
Lymphs Abs: 1.6 10*3/uL (ref 0.7–4.0)
MCH: 32.1 pg (ref 26.0–34.0)
MCHC: 33.3 g/dL (ref 30.0–36.0)
MCV: 96.3 fL (ref 80.0–100.0)
Monocytes Absolute: 0.3 10*3/uL (ref 0.1–1.0)
Monocytes Relative: 7 %
Neutro Abs: 1.9 10*3/uL (ref 1.7–7.7)
Neutrophils Relative %: 48 %
Platelet Count: 132 10*3/uL — ABNORMAL LOW (ref 150–400)
RBC: 3.83 MIL/uL — ABNORMAL LOW (ref 3.87–5.11)
RDW: 13.1 % (ref 11.5–15.5)
WBC Count: 3.9 10*3/uL — ABNORMAL LOW (ref 4.0–10.5)
nRBC: 0 % (ref 0.0–0.2)

## 2022-11-06 NOTE — Progress Notes (Signed)
  [facility] OFFICE PROGRESS NOTE   Diagnosis: Gastrointestinal stromal tumor  INTERVAL HISTORY:   Paige Robertson returns as scheduled.  She continues Gleevec.  Energy level is better.  She continues to have an irregular menstrual cycle with heavy bleeding for 4 to 5 days.  She denies nausea/vomiting.  No diarrhea.  No rash.  Objective:  Vital signs in last 24 hours:  Blood pressure 92/62, pulse 60, temperature 98.3 F (36.8 C), temperature source Oral, resp. rate 16, weight 132 lb 12.8 oz (60.2 kg), SpO2 99 %.    HEENT: No thrush or ulcers. Resp: Lungs clear bilaterally.  Breath sounds are distant. Cardio: Regular rate and rhythm. GI: Abdomen soft and nontender.  No hepatosplenomegaly.  No mass. Vascular: No leg edema.    Lab Results:  Lab Results  Component Value Date   WBC 3.9 (L) 11/06/2022   HGB 12.3 11/06/2022   HCT 36.9 11/06/2022   MCV 96.3 11/06/2022   PLT 132 (L) 11/06/2022   NEUTROABS 1.9 11/06/2022    Imaging:  No results found.  Medications: I have reviewed the patient's current medications.  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- 10.9 cm tumor, 1 mitosis per 5 mm, Ki67-less than 1%, CD117 positive, pT4pNx Gleevec starting 03/28/2021 Gleevec held beginning 05/01/2021 secondary to neutropenia Gleevec resumed 200 mg daily 05/06/2021 Gleevec escalated to 300 mg daily 06/20/2021 Gleevec escalated to 300 mg daily alternating with 400 mg daily 08/29/2021 Gleevec placed on hold 12/05/2021 secondary to nausea and anorexia, resumed at a dose of 300 mg daily 12/12/2021 Gleevec dose reduced to 200 mg daily 12/19/2021 secondary to nausea Gleevec 200 mg daily continued 01/10/2022   History of anemia and iron deficiency secondary to bleeding from #1 Multiple hospital admissions with symptomatic anemia Proximal duodenal and jejunal AVMs treated with APC and  Hemoclip Neutropenia/thrombocytopenia secondary to [place] on 05/01/2021 COVID-19 infection 05/18/2021 Elevated liver enzymes beginning November 2022-likely secondary to [place]  Disposition: Paige Robertson appears stable.  She continues Gleevec.  She seems to be tolerating well.  She will continue Gleevec at the current dose.  CBC and chemistry panel reviewed.  Labs adequate to continue [place] as above.  Transaminases with stable elevation.  She will return for lab and follow-up in 2 months.  We are available to see her sooner if needed.    Ned Card ANP/GNP-BC   11/06/2022  8:52 AM

## 2022-11-25 ENCOUNTER — Other Ambulatory Visit: Payer: Self-pay | Admitting: Oncology

## 2022-12-08 ENCOUNTER — Other Ambulatory Visit: Payer: Self-pay | Admitting: Obstetrics and Gynecology

## 2022-12-08 DIAGNOSIS — Z1231 Encounter for screening mammogram for malignant neoplasm of breast: Secondary | ICD-10-CM

## 2022-12-22 ENCOUNTER — Ambulatory Visit
Admission: RE | Admit: 2022-12-22 | Discharge: 2022-12-22 | Disposition: A | Discharge status: Home / Self Care | Payer: BC Managed Care – PPO | Source: Ambulatory Visit | Attending: Obstetrics and Gynecology | Admitting: Obstetrics and Gynecology

## 2022-12-22 DIAGNOSIS — Z1231 Encounter for screening mammogram for malignant neoplasm of breast: Secondary | ICD-10-CM | POA: Insufficient documentation

## 2022-12-25 ENCOUNTER — Encounter: Payer: Self-pay | Admitting: Oncology

## 2022-12-25 ENCOUNTER — Other Ambulatory Visit: Payer: Self-pay | Admitting: Adult Health

## 2022-12-25 ENCOUNTER — Encounter: Payer: Self-pay | Admitting: Adult Health

## 2022-12-25 ENCOUNTER — Ambulatory Visit (INDEPENDENT_AMBULATORY_CARE_PROVIDER_SITE_OTHER): Payer: Self-pay | Admitting: Adult Health

## 2022-12-25 ENCOUNTER — Encounter: Payer: Self-pay | Admitting: *Deleted

## 2022-12-25 VITALS — BP 100/60 | HR 69 | Temp 98.8°F

## 2022-12-25 DIAGNOSIS — R051 Acute cough: Secondary | ICD-10-CM

## 2022-12-25 DIAGNOSIS — R0981 Nasal congestion: Secondary | ICD-10-CM

## 2022-12-25 LAB — POC SOFIA 2 FLU + SARS ANTIGEN FIA
Influenza A, POC: NEGATIVE
Influenza B, POC: NEGATIVE
SARS Coronavirus 2 Ag: NEGATIVE

## 2022-12-25 MED ORDER — PREDNISONE 10 MG PO TABS: ORAL_TABLET | ORAL | 0 refills | Status: AC

## 2022-12-25 MED ORDER — AZITHROMYCIN 250 MG PO TABS: ORAL_TABLET | ORAL | 0 refills | Status: AC

## 2022-12-25 MED ORDER — ALBUTEROL SULFATE HFA 108 (90 BASE) MCG/ACT IN AERS: INHALATION_SPRAY | RESPIRATORY_TRACT | 0 refills | Status: DC

## 2022-12-25 NOTE — Progress Notes (Signed)
Sent Prednisone RX after patient confirmed with oncologist.   Kendell Bane DNP, NP-C Nurse Practitioner [facility]

## 2022-12-25 NOTE — Progress Notes (Signed)
[facility] [address], [place] [address]   Office Visit Note  Patient Name: Paige Robertson Date of Birth [id number]  Medical Record number [id number]  Date of Service: 12/25/2022  No chief complaint on file.    HPI Pt is here for a sick visit. She reports she has been feeling bad for 4 days.  She describes sinus congestion, chest congestion, cough, ear pressure.  Denies fever, chills or sweats.  Daughter had Flu and strep last week.  She has tried Mucinex with no improvement.  History of asthma as a kid.  Currently on Chemotherapy.    Current Medication:  Outpatient Encounter Medications as of 12/25/2022  Medication Sig   albuterol (VENTOLIN HFA) 108 (90 Base) MCG/ACT inhaler Use Two puffs three times daily for 2 weeks, and then 2 puffs twice daily for one week. After that, may use 2 puffs every 4-6 hours as needed.   azithromycin (ZITHROMAX) 250 MG tablet Take 2 tablets on day 1, then 1 tablet daily on days 2 through 5   acetaminophen (TYLENOL) 325 MG tablet Take 2 tablets (650 mg total) by mouth every 6 (six) hours as needed for mild pain, moderate pain, fever or headache.   cetirizine (ZYRTEC) 10 MG tablet Take 10 mg by mouth daily.   imatinib (GLEEVEC) 100 MG tablet TAKE 2 TABLETS (200 MG TOTAL) DAILY.   naproxen sodium (ALEVE) 220 MG tablet Take 1 tablet (220 mg total) by mouth every 8 (eight) hours as needed. Maximum of 600 mg/day   ondansetron (ZOFRAN) 8 MG tablet Take 1 tablet (8 mg total) by mouth every 8 (eight) hours as needed for nausea or vomiting. (Patient not taking: Reported on 11/14/2021)   prochlorperazine (COMPAZINE) 10 MG tablet Take 1 tablet (10 mg total) by mouth every 6 (six) hours as needed for nausea or vomiting. (Patient not taking: Reported on 11/14/2021)   No facility-administered encounter medications on file as of 12/25/2022.      Medical History: Past Medical History:  Diagnosis Date   Acid reflux    Hematuria     Hyperlipemia    IDA (iron deficiency anemia)    Melena    Perimenopausal symptoms    Vaginal discharge      Vital Signs: BP 100/60 (BP Location: Left Arm, Patient Position: Sitting, Cuff Size: Normal)   Pulse 69   Temp 98.8 F (37.1 C)   LMP 11/05/2022   SpO2 99%    Review of Systems  Constitutional:  Negative for chills, fatigue and fever.  HENT:  Positive for congestion, ear pain and sinus pressure.   Eyes:  Negative for pain and itching.  Respiratory:  Positive for cough, chest tightness and shortness of breath. Negative for wheezing.   Cardiovascular:  Negative for chest pain.  Gastrointestinal:  Negative for diarrhea, nausea and vomiting.    Physical Exam Vitals and nursing note reviewed.  Constitutional:      Appearance: She is ill-appearing.  HENT:     Head: Normocephalic.     Right Ear: Tympanic membrane and ear canal normal.     Left Ear: Tympanic membrane and ear canal normal.     Nose: Nose normal.     Mouth/Throat:     Mouth: Mucous membranes are moist.  Eyes:     Pupils: Pupils are equal, round, and reactive to light.  Cardiovascular:     Rate and Rhythm: Normal rate and regular rhythm.  Pulmonary:     Effort: Pulmonary effort  is normal.     Comments: Diminished breath sounds in all fields. No acute distress noted. Lymphadenopathy:     Cervical: No cervical adenopathy.  Neurological:     Mental Status: She is alert.      Results for orders placed or performed in visit on 12/25/22 (from the past 24 hour(s))  POC SOFIA 2 FLU + SARS ANTIGEN FIA     Status: Normal   Collection Time: 12/25/22 10:15 AM  Result Value Ref Range   Influenza A, POC Negative Negative   Influenza B, POC Negative Negative   SARS Coronavirus 2 Ag Negative Negative    Assessment/Plan: 1. Acute cough Negative Flu/covid in clinic. Use albuterol as prescribed.  Take Zpak also.  Discussed prednisone however there appear to be some interactions  - albuterol (VENTOLIN HFA) 108  (90 Base) MCG/ACT inhaler; Use Two puffs three times daily for 2 weeks, and then 2 puffs twice daily for one week. After that, may use 2 puffs every 4-6 hours as needed.  Dispense: 8 g; Refill: 0 - azithromycin (ZITHROMAX) 250 MG tablet; Take 2 tablets on day 1, then 1 tablet daily on days 2 through 5  Dispense: 6 tablet; Refill: 0  2. Congestion of nasal sinus - POC SOFIA 2 FLU + SARS ANTIGEN FIA        General Counseling: Paige Robertson verbalizes understanding of the findings of todays visit and agrees with plan of treatment. I have discussed any further diagnostic evaluation that may be needed or ordered today. We also reviewed her medications today. she has been encouraged to call the office with any questions or concerns that should arise related to todays visit.   Orders Placed This Encounter  Procedures   POC SOFIA 2 FLU + SARS ANTIGEN FIA    Meds ordered this encounter  Medications   albuterol (VENTOLIN HFA) 108 (90 Base) MCG/ACT inhaler    Sig: Use Two puffs three times daily for 2 weeks, and then 2 puffs twice daily for one week. After that, may use 2 puffs every 4-6 hours as needed.    Dispense:  8 g    Refill:  0   azithromycin (ZITHROMAX) 250 MG tablet    Sig: Take 2 tablets on day 1, then 1 tablet daily on days 2 through 5    Dispense:  6 tablet    Refill:  0    Time spent:20 Minutes    Kendell Bane AGNP-C Nurse Practitioner

## 2023-01-01 ENCOUNTER — Inpatient Hospital Stay: Payer: BC Managed Care – PPO | Attending: Oncology

## 2023-01-01 ENCOUNTER — Inpatient Hospital Stay: Payer: BC Managed Care – PPO | Admitting: Oncology

## 2023-01-01 VITALS — BP 99/60 | HR 76 | Temp 98.1°F | Resp 20 | Ht 62.0 in | Wt 130.8 lb

## 2023-01-01 DIAGNOSIS — D6959 Other secondary thrombocytopenia: Secondary | ICD-10-CM | POA: Diagnosis not present

## 2023-01-01 DIAGNOSIS — Z79899 Other long term (current) drug therapy: Secondary | ICD-10-CM | POA: Diagnosis not present

## 2023-01-01 DIAGNOSIS — C49A3 Gastrointestinal stromal tumor of small intestine: Secondary | ICD-10-CM | POA: Diagnosis present

## 2023-01-01 DIAGNOSIS — D649 Anemia, unspecified: Secondary | ICD-10-CM | POA: Insufficient documentation

## 2023-01-01 DIAGNOSIS — D709 Neutropenia, unspecified: Secondary | ICD-10-CM | POA: Diagnosis not present

## 2023-01-01 DIAGNOSIS — J069 Acute upper respiratory infection, unspecified: Secondary | ICD-10-CM | POA: Insufficient documentation

## 2023-01-01 DIAGNOSIS — R748 Abnormal levels of other serum enzymes: Secondary | ICD-10-CM | POA: Insufficient documentation

## 2023-01-01 DIAGNOSIS — Z8616 Personal history of COVID-19: Secondary | ICD-10-CM | POA: Diagnosis not present

## 2023-01-01 LAB — CBC WITH DIFFERENTIAL (CANCER CENTER ONLY)
Abs Immature Granulocytes: 0.01 10*3/uL (ref 0.00–0.07)
Basophils Absolute: 0 10*3/uL (ref 0.0–0.1)
Basophils Relative: 0 %
Eosinophils Absolute: 0.1 10*3/uL (ref 0.0–0.5)
Eosinophils Relative: 1 %
HCT: 35.8 % — ABNORMAL LOW (ref 36.0–46.0)
Hemoglobin: 12.3 g/dL (ref 12.0–15.0)
Immature Granulocytes: 0 %
Lymphocytes Relative: 51 %
Lymphs Abs: 3.7 10*3/uL (ref 0.7–4.0)
MCH: 32.6 pg (ref 26.0–34.0)
MCHC: 34.4 g/dL (ref 30.0–36.0)
MCV: 95 fL (ref 80.0–100.0)
Monocytes Absolute: 0.4 10*3/uL (ref 0.1–1.0)
Monocytes Relative: 6 %
Neutro Abs: 3 10*3/uL (ref 1.7–7.7)
Neutrophils Relative %: 42 %
Platelet Count: 189 10*3/uL (ref 150–400)
RBC: 3.77 MIL/uL — ABNORMAL LOW (ref 3.87–5.11)
RDW: 13 % (ref 11.5–15.5)
WBC Count: 7.2 10*3/uL (ref 4.0–10.5)
nRBC: 0 % (ref 0.0–0.2)

## 2023-01-01 LAB — CMP (CANCER CENTER ONLY)
ALT: 39 U/L (ref 0–44)
AST: 25 U/L (ref 15–41)
Albumin: 4.2 g/dL (ref 3.5–5.0)
Alkaline Phosphatase: 71 U/L (ref 38–126)
Anion gap: 6 (ref 5–15)
BUN: 13 mg/dL (ref 6–20)
CO2: 33 mmol/L — ABNORMAL HIGH (ref 22–32)
Calcium: 10 mg/dL (ref 8.9–10.3)
Chloride: 102 mmol/L (ref 98–111)
Creatinine: 0.78 mg/dL (ref 0.44–1.00)
GFR, Estimated: >60 mL/min (ref >60)
Glucose, Bld: 79 mg/dL (ref 70–99)
Potassium: 3.8 mmol/L (ref 3.5–5.1)
Sodium: 141 mmol/L (ref 135–145)
Total Bilirubin: 0.5 mg/dL (ref 0.3–1.2)
Total Protein: 6.7 g/dL (ref 6.5–8.1)

## 2023-01-01 NOTE — Progress Notes (Signed)
  [facility] OFFICE PROGRESS NOTE   Diagnosis: Gastrointestinal stromal tumor  INTERVAL HISTORY:   Paige Robertson returns as scheduled.  She continues Gleevec.  No rash or diarrhea.  She developed an upper respiratory infection last week with nasal and chest congestion.  She was treated with prednisone, an inhaler, and a Z-Pak.  Her symptoms have improved.  She completed prednisone yesterday.  She reports negative testing for COVID-19 and influenza.  Objective:  Vital signs in last 24 hours:  Blood pressure 99/60, pulse 76, temperature 98.1 F (36.7 C), temperature source Oral, resp. rate 20, height '5\' 2"'$  (1.575 m), weight 130 lb 12.8 oz (59.3 kg), last menstrual period 11/05/2022, SpO2 100 %.    HEENT: No thrush or ulcers Resp: Lungs clear bilaterally, distant breath sounds Cardio: Regular rate and rhythm GI: No hepatosplenomegaly, no mass, nontender Vascular: No leg edema  Lab Results:  Lab Results  Component Value Date   WBC 7.2 01/01/2023   HGB 12.3 01/01/2023   HCT 35.8 (L) 01/01/2023   MCV 95.0 01/01/2023   PLT 189 01/01/2023   NEUTROABS 3.0 01/01/2023    CMP  Lab Results  Component Value Date   NA 141 11/06/2022   K 4.5 11/06/2022   CL 105 11/06/2022   CO2 28 11/06/2022   GLUCOSE 96 11/06/2022   BUN 20 11/06/2022   CREATININE 0.77 11/06/2022   CALCIUM 9.7 11/06/2022   PROT 7.1 11/06/2022   ALBUMIN 4.5 11/06/2022   AST 71 (H) 11/06/2022   ALT 81 (H) 11/06/2022   ALKPHOS 77 11/06/2022   BILITOT 0.3 11/06/2022   GFRNONAA >60 11/06/2022   GFRAA >60 07/09/2020    Lab Results  Component Value Date   CEA1 0.7 02/18/2021     Medications: I have reviewed the patient's current medications.   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- 10.9 cm tumor, 1 mitosis per 5 mm, Ki67-less than 1%, CD117 positive, pT4pNx Gleevec starting 03/28/2021 Gleevec held beginning 05/01/2021  secondary to neutropenia Gleevec resumed 200 mg daily 05/06/2021 Gleevec escalated to 300 mg daily 06/20/2021 Gleevec escalated to 300 mg daily alternating with 400 mg daily 08/29/2021 Gleevec placed on hold 12/05/2021 secondary to nausea and anorexia, resumed at a dose of 300 mg daily 12/12/2021 Gleevec dose reduced to 200 mg daily 12/19/2021 secondary to nausea Gleevec 200 mg daily continued 01/10/2022   History of anemia and iron deficiency secondary to bleeding from #1 Multiple hospital admissions with symptomatic anemia 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  She continues Gleevec.  We will follow-up on the chemistry panel from today.  She will return for an office and lab visit in 6 weeks.  She appears to be recovering from a recent upper respiratory infection.  [name], MD  01/01/2023  8:34 AM

## 2023-01-27 ENCOUNTER — Other Ambulatory Visit: Payer: Self-pay | Admitting: Oncology

## 2023-02-12 ENCOUNTER — Inpatient Hospital Stay: Payer: BC Managed Care – PPO | Attending: Oncology

## 2023-02-12 ENCOUNTER — Inpatient Hospital Stay: Payer: BC Managed Care – PPO | Admitting: Oncology

## 2023-02-12 VITALS — BP 96/67 | HR 77 | Temp 98.2°F | Resp 18 | Ht 62.0 in | Wt 129.4 lb

## 2023-02-12 DIAGNOSIS — C49A3 Gastrointestinal stromal tumor of small intestine: Secondary | ICD-10-CM | POA: Insufficient documentation

## 2023-02-12 DIAGNOSIS — R748 Abnormal levels of other serum enzymes: Secondary | ICD-10-CM | POA: Diagnosis not present

## 2023-02-12 DIAGNOSIS — Z79899 Other long term (current) drug therapy: Secondary | ICD-10-CM | POA: Insufficient documentation

## 2023-02-12 LAB — CBC WITH DIFFERENTIAL (CANCER CENTER ONLY)
Abs Immature Granulocytes: 0 10*3/uL (ref 0.00–0.07)
Basophils Absolute: 0 10*3/uL (ref 0.0–0.1)
Basophils Relative: 1 %
Eosinophils Absolute: 0.1 10*3/uL (ref 0.0–0.5)
Eosinophils Relative: 2 %
HCT: 36.4 % (ref 36.0–46.0)
Hemoglobin: 12.4 g/dL (ref 12.0–15.0)
Immature Granulocytes: 0 %
Lymphocytes Relative: 37 %
Lymphs Abs: 1.5 10*3/uL (ref 0.7–4.0)
MCH: 32.5 pg (ref 26.0–34.0)
MCHC: 34.1 g/dL (ref 30.0–36.0)
MCV: 95.5 fL (ref 80.0–100.0)
Monocytes Absolute: 0.3 10*3/uL (ref 0.1–1.0)
Monocytes Relative: 8 %
Neutro Abs: 2 10*3/uL (ref 1.7–7.7)
Neutrophils Relative %: 52 %
Platelet Count: 150 10*3/uL (ref 150–400)
RBC: 3.81 MIL/uL — ABNORMAL LOW (ref 3.87–5.11)
RDW: 12.9 % (ref 11.5–15.5)
WBC Count: 3.9 10*3/uL — ABNORMAL LOW (ref 4.0–10.5)
nRBC: 0 % (ref 0.0–0.2)

## 2023-02-12 LAB — CMP (CANCER CENTER ONLY)
ALT: 56 U/L — ABNORMAL HIGH (ref 0–44)
AST: 50 U/L — ABNORMAL HIGH (ref 15–41)
Albumin: 4.6 g/dL (ref 3.5–5.0)
Alkaline Phosphatase: 66 U/L (ref 38–126)
Anion gap: 5 (ref 5–15)
BUN: 10 mg/dL (ref 6–20)
CO2: 32 mmol/L (ref 22–32)
Calcium: 10.4 mg/dL — ABNORMAL HIGH (ref 8.9–10.3)
Chloride: 103 mmol/L (ref 98–111)
Creatinine: 0.78 mg/dL (ref 0.44–1.00)
GFR, Estimated: >60 mL/min (ref >60)
Glucose, Bld: 89 mg/dL (ref 70–99)
Potassium: 4.2 mmol/L (ref 3.5–5.1)
Sodium: 140 mmol/L (ref 135–145)
Total Bilirubin: 0.5 mg/dL (ref 0.3–1.2)
Total Protein: 7.1 g/dL (ref 6.5–8.1)

## 2023-02-12 NOTE — Progress Notes (Signed)
  [name]   Diagnosis: Gastrointestinal stromal tumor  INTERVAL HISTORY:   Paige Robertson returns as scheduled.  She continues Gleevec.  No rash, diarrhea, or swelling.  No complaint.  Objective:  Vital signs in last 24 hours:  Blood pressure 96/67, pulse 77, temperature 98.2 F (36.8 C), temperature source Oral, resp. rate 18, height 5\' 2"  (1.575 m), weight 129 lb 6.4 oz (58.7 kg), SpO2 100 %.    HEENT: No thrush or ulcers Resp: Distant breath sounds, no respiratory distress Cardio: Regular rate and rhythm GI: No hepatosplenomegaly, nontender, no mass Vascular: No leg edema  Skin: No rash  Lab Results:  Lab Results  Component Value Date   WBC 3.9 (L) 02/12/2023   HGB 12.4 02/12/2023   HCT 36.4 02/12/2023   MCV 95.5 02/12/2023   PLT 150 02/12/2023   NEUTROABS 2.0 02/12/2023    CMP  Lab Results  Component Value Date   NA 140 02/12/2023   K 4.2 02/12/2023   CL 103 02/12/2023   CO2 32 02/12/2023   GLUCOSE 89 02/12/2023   BUN 10 02/12/2023   CREATININE 0.78 02/12/2023   CALCIUM 10.4 (H) 02/12/2023   PROT 7.1 02/12/2023   ALBUMIN 4.6 02/12/2023   AST 50 (H) 02/12/2023   ALT 56 (H) 02/12/2023   ALKPHOS 66 02/12/2023   BILITOT 0.5 02/12/2023   GFRNONAA >60 02/12/2023   GFRAA >60 07/09/2020    Lab Results  Component Value Date   CEA1 0.7 02/18/2021    Medications: I have reviewed the patient's current medications.   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- 10.9 cm tumor, 1 mitosis per 5 mm, Ki67-less than 1%, CD117 positive, pT4pNx Gleevec starting 03/28/2021 Gleevec held beginning 05/01/2021 secondary to neutropenia Gleevec resumed 200 mg daily 05/06/2021 Gleevec escalated to 300 mg daily 06/20/2021 Gleevec escalated to 300 mg daily alternating with 400 mg daily 08/29/2021 Gleevec placed on hold 12/05/2021 secondary to nausea and anorexia, resumed at a  dose of 300 mg daily 12/12/2021 Gleevec dose reduced to 200 mg daily 12/19/2021 secondary to nausea Gleevec 200 mg daily continued 01/10/2022   History of anemia and iron deficiency secondary to bleeding from #1 Multiple hospital admissions with symptomatic anemia 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  She is tolerating Gleevec well.  Mild elevation of the liver enzymes is likely related to [place].  She will continue Gleevec at the current dose.  She had swelling and nausea at the 300 mg dose.  [name] will return for an office and lab visit in 2 months.  [name], MD  02/12/2023  8:48 AM

## 2023-03-24 ENCOUNTER — Other Ambulatory Visit: Payer: Self-pay | Admitting: Oncology

## 2023-04-09 ENCOUNTER — Inpatient Hospital Stay: Payer: BC Managed Care – PPO | Attending: Oncology

## 2023-04-09 ENCOUNTER — Inpatient Hospital Stay: Payer: BC Managed Care – PPO | Admitting: Oncology

## 2023-04-09 VITALS — BP 104/73 | HR 64 | Temp 98.1°F | Resp 18 | Ht 62.0 in | Wt 126.7 lb

## 2023-04-09 DIAGNOSIS — D500 Iron deficiency anemia secondary to blood loss (chronic): Secondary | ICD-10-CM | POA: Diagnosis not present

## 2023-04-09 DIAGNOSIS — C49A3 Gastrointestinal stromal tumor of small intestine: Secondary | ICD-10-CM

## 2023-04-09 DIAGNOSIS — R748 Abnormal levels of other serum enzymes: Secondary | ICD-10-CM | POA: Diagnosis not present

## 2023-04-09 DIAGNOSIS — Z79899 Other long term (current) drug therapy: Secondary | ICD-10-CM | POA: Insufficient documentation

## 2023-04-09 LAB — CBC WITH DIFFERENTIAL (CANCER CENTER ONLY)
Abs Immature Granulocytes: 0 10*3/uL (ref 0.00–0.07)
Basophils Absolute: 0 10*3/uL (ref 0.0–0.1)
Basophils Relative: 1 %
Eosinophils Absolute: 0.1 10*3/uL (ref 0.0–0.5)
Eosinophils Relative: 2 %
HCT: 36.1 % (ref 36.0–46.0)
Hemoglobin: 12.1 g/dL (ref 12.0–15.0)
Immature Granulocytes: 0 %
Lymphocytes Relative: 31 %
Lymphs Abs: 1.3 10*3/uL (ref 0.7–4.0)
MCH: 32.1 pg (ref 26.0–34.0)
MCHC: 33.5 g/dL (ref 30.0–36.0)
MCV: 95.8 fL (ref 80.0–100.0)
Monocytes Absolute: 0.4 10*3/uL (ref 0.1–1.0)
Monocytes Relative: 9 %
Neutro Abs: 2.4 10*3/uL (ref 1.7–7.7)
Neutrophils Relative %: 57 %
Platelet Count: 148 10*3/uL — ABNORMAL LOW (ref 150–400)
RBC: 3.77 MIL/uL — ABNORMAL LOW (ref 3.87–5.11)
RDW: 12.8 % (ref 11.5–15.5)
WBC Count: 4.1 10*3/uL (ref 4.0–10.5)
nRBC: 0 % (ref 0.0–0.2)

## 2023-04-09 LAB — CMP (CANCER CENTER ONLY)
ALT: 79 U/L — ABNORMAL HIGH (ref 0–44)
AST: 70 U/L — ABNORMAL HIGH (ref 15–41)
Albumin: 4.1 g/dL (ref 3.5–5.0)
Alkaline Phosphatase: 70 U/L (ref 38–126)
Anion gap: 7 (ref 5–15)
BUN: 11 mg/dL (ref 6–20)
CO2: 27 mmol/L (ref 22–32)
Calcium: 9.5 mg/dL (ref 8.9–10.3)
Chloride: 104 mmol/L (ref 98–111)
Creatinine: 0.79 mg/dL (ref 0.44–1.00)
GFR, Estimated: >60 mL/min (ref >60)
Glucose, Bld: 90 mg/dL (ref 70–99)
Potassium: 4.3 mmol/L (ref 3.5–5.1)
Sodium: 138 mmol/L (ref 135–145)
Total Bilirubin: 0.6 mg/dL (ref 0.3–1.2)
Total Protein: 6.7 g/dL (ref 6.5–8.1)

## 2023-04-09 NOTE — Progress Notes (Signed)
  [facility] OFFICE PROGRESS NOTE   Diagnosis: Gastrointestinal stromal tumor  INTERVAL HISTORY:   Ms. Raney returns as scheduled.  She continues Gleevec.  No rash, diarrhea, or swelling.  She had an episode of upper abdominal discomfort 2 weeks ago.  She took Tums.  The discomfort resolved.  She thinks this may have been related to eating "[place] stew ".  No difficulty with bowel or bladder function.  Her appetite has manage since beginning Gleevec.  Objective:  Vital signs in last 24 hours:  Blood pressure 104/73, pulse 64, temperature 98.1 F (36.7 C), resp. rate 18, height 5\' 2"  (1.575 m), weight 126 lb 11.2 oz (57.5 kg), SpO2 100 %.    HEENT: No thrush or ulcers Resp: Distant breath sounds, slight decrease in breath sounds at the right compared to the left chest.  No dullness on percussion.  No respiratory distress Cardio: Regular rate and rhythm GI: Nontender, no mass, no hepatosplenomegaly, no apparent ascites Vascular: No leg edema  Skin: No rash   Lab Results:  Lab Results  Component Value Date   WBC 4.1 04/09/2023   HGB 12.1 04/09/2023   HCT 36.1 04/09/2023   MCV 95.8 04/09/2023   PLT 148 (L) 04/09/2023   NEUTROABS 2.4 04/09/2023    CMP  Lab Results  Component Value Date   NA 140 02/12/2023   K 4.2 02/12/2023   CL 103 02/12/2023   CO2 32 02/12/2023   GLUCOSE 89 02/12/2023   BUN 10 02/12/2023   CREATININE 0.78 02/12/2023   CALCIUM 10.4 (H) 02/12/2023   PROT 7.1 02/12/2023   ALBUMIN 4.6 02/12/2023   AST 50 (H) 02/12/2023   ALT 56 (H) 02/12/2023   ALKPHOS 66 02/12/2023   BILITOT 0.5 02/12/2023   GFRNONAA >60 02/12/2023   GFRAA >60 07/09/2020    Lab Results  Component Value Date   CEA1 0.7 02/18/2021    Medications: I have reviewed the patient's current medications.   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- 10.9 cm tumor, 1 mitosis per 5 mm, Ki67-less  than 1%, CD117 positive, pT4pNx Gleevec starting 03/28/2021 Gleevec held beginning 05/01/2021 secondary to neutropenia Gleevec resumed 200 mg daily 05/06/2021 Gleevec escalated to 300 mg daily 06/20/2021 Gleevec escalated to 300 mg daily alternating with 400 mg daily 08/29/2021 Gleevec placed on hold 12/05/2021 secondary to nausea and anorexia, resumed at a dose of 300 mg daily 12/12/2021 Gleevec dose reduced to 200 mg daily 12/19/2021 secondary to nausea Gleevec 200 mg daily continued 01/10/2022   History of anemia and iron deficiency secondary to bleeding from #1 Multiple hospital admissions with symptomatic anemia 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  She continues adjuvant Gleevec.  The transient upper abdominal discomfort was likely related to a benign condition.  We will refer her for a restaging CT if she has consistent discomfort or continued weight loss.  She will continue Gleevec at the current dose.  She will return for an office and lab visit in 2 months.  Thornton Papas, MD  04/09/2023  8:16 AM

## 2023-05-26 ENCOUNTER — Other Ambulatory Visit: Payer: Self-pay | Admitting: Oncology

## 2023-06-01 ENCOUNTER — Telehealth: Payer: Self-pay | Admitting: *Deleted

## 2023-06-01 ENCOUNTER — Inpatient Hospital Stay: Payer: BC Managed Care – PPO | Attending: Oncology

## 2023-06-01 ENCOUNTER — Inpatient Hospital Stay: Payer: BC Managed Care – PPO | Admitting: Oncology

## 2023-06-01 ENCOUNTER — Telehealth: Payer: Self-pay | Admitting: Oncology

## 2023-06-01 DIAGNOSIS — C49A3 Gastrointestinal stromal tumor of small intestine: Secondary | ICD-10-CM

## 2023-06-01 LAB — CMP (CANCER CENTER ONLY)
ALT: 44 U/L (ref 0–44)
AST: 41 U/L (ref 15–41)
Albumin: 4.3 g/dL (ref 3.5–5.0)
Alkaline Phosphatase: 72 U/L (ref 38–126)
Anion gap: 3 — ABNORMAL LOW (ref 5–15)
BUN: 15 mg/dL (ref 6–20)
CO2: 29 mmol/L (ref 22–32)
Calcium: 9.6 mg/dL (ref 8.9–10.3)
Chloride: 106 mmol/L (ref 98–111)
Creatinine: 0.83 mg/dL (ref 0.44–1.00)
GFR, Estimated: >60 mL/min (ref >60)
Glucose, Bld: 86 mg/dL (ref 70–99)
Potassium: 4.6 mmol/L (ref 3.5–5.1)
Sodium: 138 mmol/L (ref 135–145)
Total Bilirubin: 0.4 mg/dL (ref 0.3–1.2)
Total Protein: 6.9 g/dL (ref 6.5–8.1)

## 2023-06-01 LAB — CBC WITH DIFFERENTIAL (CANCER CENTER ONLY)
Abs Immature Granulocytes: 0.01 10*3/uL (ref 0.00–0.07)
Basophils Absolute: 0 10*3/uL (ref 0.0–0.1)
Basophils Relative: 1 %
Eosinophils Absolute: 0.1 10*3/uL (ref 0.0–0.5)
Eosinophils Relative: 3 %
HCT: 35.7 % — ABNORMAL LOW (ref 36.0–46.0)
Hemoglobin: 11.8 g/dL — ABNORMAL LOW (ref 12.0–15.0)
Immature Granulocytes: 0 %
Lymphocytes Relative: 41 %
Lymphs Abs: 1.5 10*3/uL (ref 0.7–4.0)
MCH: 31.7 pg (ref 26.0–34.0)
MCHC: 33.1 g/dL (ref 30.0–36.0)
MCV: 96 fL (ref 80.0–100.0)
Monocytes Absolute: 0.3 10*3/uL (ref 0.1–1.0)
Monocytes Relative: 7 %
Neutro Abs: 1.8 10*3/uL (ref 1.7–7.7)
Neutrophils Relative %: 48 %
Platelet Count: 165 10*3/uL (ref 150–400)
RBC: 3.72 MIL/uL — ABNORMAL LOW (ref 3.87–5.11)
RDW: 13.1 % (ref 11.5–15.5)
WBC Count: 3.8 10*3/uL — ABNORMAL LOW (ref 4.0–10.5)
nRBC: 0 % (ref 0.0–0.2)

## 2023-06-01 NOTE — Telephone Encounter (Signed)
Per Dr. Truett Perna: Labs are stable. Continue Gleevec 200 mg/day and reschedule for lab/OV in 6-8 weeks.  Patient notified. Scheduling message sent.

## 2023-06-01 NOTE — Telephone Encounter (Signed)
Spoke with patient confirming upcoming appointment  

## 2023-07-14 ENCOUNTER — Inpatient Hospital Stay: Payer: BC Managed Care – PPO | Admitting: Oncology

## 2023-07-14 ENCOUNTER — Inpatient Hospital Stay: Payer: BC Managed Care – PPO | Attending: Oncology

## 2023-07-14 VITALS — BP 99/56 | HR 62 | Temp 98.2°F | Resp 18 | Ht 62.0 in | Wt 128.2 lb

## 2023-07-14 DIAGNOSIS — C49A3 Gastrointestinal stromal tumor of small intestine: Secondary | ICD-10-CM

## 2023-07-14 LAB — CMP (CANCER CENTER ONLY)
ALT: 33 U/L (ref 0–44)
AST: 34 U/L (ref 15–41)
Albumin: 4.3 g/dL (ref 3.5–5.0)
Alkaline Phosphatase: 77 U/L (ref 38–126)
Anion gap: 5 (ref 5–15)
BUN: 10 mg/dL (ref 6–20)
CO2: 31 mmol/L (ref 22–32)
Calcium: 9.9 mg/dL (ref 8.9–10.3)
Chloride: 103 mmol/L (ref 98–111)
Creatinine: 0.84 mg/dL (ref 0.44–1.00)
GFR, Estimated: >60 mL/min (ref >60)
Glucose, Bld: 79 mg/dL (ref 70–99)
Potassium: 4.2 mmol/L (ref 3.5–5.1)
Sodium: 139 mmol/L (ref 135–145)
Total Bilirubin: 0.4 mg/dL (ref 0.3–1.2)
Total Protein: 7 g/dL (ref 6.5–8.1)

## 2023-07-14 LAB — CBC WITH DIFFERENTIAL (CANCER CENTER ONLY)
Abs Immature Granulocytes: 0 10*3/uL (ref 0.00–0.07)
Basophils Absolute: 0 10*3/uL (ref 0.0–0.1)
Basophils Relative: 1 %
Eosinophils Absolute: 0.1 10*3/uL (ref 0.0–0.5)
Eosinophils Relative: 3 %
HCT: 35 % — ABNORMAL LOW (ref 36.0–46.0)
Hemoglobin: 12.2 g/dL (ref 12.0–15.0)
Immature Granulocytes: 0 %
Lymphocytes Relative: 46 %
Lymphs Abs: 1.8 10*3/uL (ref 0.7–4.0)
MCH: 33.1 pg (ref 26.0–34.0)
MCHC: 34.9 g/dL (ref 30.0–36.0)
MCV: 94.9 fL (ref 80.0–100.0)
Monocytes Absolute: 0.2 10*3/uL (ref 0.1–1.0)
Monocytes Relative: 6 %
Neutro Abs: 1.7 10*3/uL (ref 1.7–7.7)
Neutrophils Relative %: 44 %
Platelet Count: 149 10*3/uL — ABNORMAL LOW (ref 150–400)
RBC: 3.69 MIL/uL — ABNORMAL LOW (ref 3.87–5.11)
RDW: 12.9 % (ref 11.5–15.5)
WBC Count: 3.9 10*3/uL — ABNORMAL LOW (ref 4.0–10.5)
nRBC: 0 % (ref 0.0–0.2)

## 2023-07-14 NOTE — Progress Notes (Signed)
  [facility] OFFICE PROGRESS NOTE   Diagnosis: Gastrointestinal stromal tumor  INTERVAL HISTORY:   Ms. Falter returns as scheduled.  She continues Gleevec.  She had a few episodes of diarrhea over the past few months.  No consistent diarrhea.  No other complaint.  Objective:  Vital signs in last 24 hours:  Blood pressure (!) 99/56, pulse 62, temperature 98.2 F (36.8 C), temperature source Oral, resp. rate 18, height 5\' 2"  (1.575 m), weight 128 lb 3.2 oz (58.2 kg), SpO2 100%.    HEENT: No thrush or ulcers Resp: Distant breath sounds, no respiratory distress, no dullness to percussion, decrease in breath sounds at the right compared to the left posterior chest Cardio: Regular rate and rhythm GI: Nontender, no mass, no apparent ascites, no hepatosplenomegaly Vascular: No leg edema  Skin: No rash  Lab Results:  Lab Results  Component Value Date   WBC 3.9 (L) 07/14/2023   HGB 12.2 07/14/2023   HCT 35.0 (L) 07/14/2023   MCV 94.9 07/14/2023   PLT 149 (L) 07/14/2023   NEUTROABS 1.7 07/14/2023    CMP  Lab Results  Component Value Date   NA 139 07/14/2023   K 4.2 07/14/2023   CL 103 07/14/2023   CO2 31 07/14/2023   GLUCOSE 79 07/14/2023   BUN 10 07/14/2023   CREATININE 0.84 07/14/2023   CALCIUM 9.9 07/14/2023   PROT 7.0 07/14/2023   ALBUMIN 4.3 07/14/2023   AST 34 07/14/2023   ALT 33 07/14/2023   ALKPHOS 77 07/14/2023   BILITOT 0.4 07/14/2023   GFRNONAA >60 07/14/2023   GFRAA >60 07/09/2020     Medications: I have reviewed the patient's current medications.   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- 10.9 cm tumor, 1 mitosis per 5 mm, Ki67-less than 1%, CD117 positive, pT4pNx Gleevec starting 03/28/2021 Gleevec held beginning 05/01/2021 secondary to neutropenia Gleevec resumed 200 mg daily 05/06/2021 Gleevec escalated to 300 mg daily 06/20/2021 Gleevec escalated to 300 mg daily  alternating with 400 mg daily 08/29/2021 Gleevec placed on hold 12/05/2021 secondary to nausea and anorexia, resumed at a dose of 300 mg daily 12/12/2021 Gleevec dose reduced to 200 mg daily 12/19/2021 secondary to nausea Gleevec 200 mg daily continued 01/10/2022   History of anemia and iron deficiency secondary to bleeding from #1 Multiple hospital admissions with symptomatic anemia 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 Handrick appears stable.  She remains in clinical remission from a gastrointestinal stromal tumor.  She continues to tolerate the Gleevec well.  She will continue Gleevec at the current dose.  She will call for consistent diarrhea or new symptoms.  She will return for an office and lab visit in 2 months.  Thornton Papas, MD  07/14/2023  8:32 AM

## 2023-07-24 ENCOUNTER — Other Ambulatory Visit: Payer: Self-pay | Admitting: Oncology

## 2023-09-14 ENCOUNTER — Inpatient Hospital Stay: Payer: BC Managed Care – PPO | Admitting: Oncology

## 2023-09-14 ENCOUNTER — Encounter: Payer: Self-pay | Admitting: Oncology

## 2023-09-14 ENCOUNTER — Inpatient Hospital Stay: Payer: BC Managed Care – PPO | Attending: Oncology

## 2023-09-14 VITALS — BP 88/58 | HR 67 | Temp 98.1°F | Resp 18 | Ht 62.0 in | Wt 125.0 lb

## 2023-09-14 DIAGNOSIS — C49A3 Gastrointestinal stromal tumor of small intestine: Secondary | ICD-10-CM | POA: Diagnosis present

## 2023-09-14 DIAGNOSIS — Z79899 Other long term (current) drug therapy: Secondary | ICD-10-CM | POA: Diagnosis not present

## 2023-09-14 LAB — CBC WITH DIFFERENTIAL (CANCER CENTER ONLY)
Abs Immature Granulocytes: 0.01 10*3/uL (ref 0.00–0.07)
Basophils Absolute: 0.1 10*3/uL (ref 0.0–0.1)
Basophils Relative: 1 %
Eosinophils Absolute: 0.1 10*3/uL (ref 0.0–0.5)
Eosinophils Relative: 2 %
HCT: 36.9 % (ref 36.0–46.0)
Hemoglobin: 12.5 g/dL (ref 12.0–15.0)
Immature Granulocytes: 0 %
Lymphocytes Relative: 39 %
Lymphs Abs: 1.5 10*3/uL (ref 0.7–4.0)
MCH: 32.5 pg (ref 26.0–34.0)
MCHC: 33.9 g/dL (ref 30.0–36.0)
MCV: 95.8 fL (ref 80.0–100.0)
Monocytes Absolute: 0.3 10*3/uL (ref 0.1–1.0)
Monocytes Relative: 7 %
Neutro Abs: 2 10*3/uL (ref 1.7–7.7)
Neutrophils Relative %: 51 %
Platelet Count: 158 10*3/uL (ref 150–400)
RBC: 3.85 MIL/uL — ABNORMAL LOW (ref 3.87–5.11)
RDW: 12.9 % (ref 11.5–15.5)
WBC Count: 3.9 10*3/uL — ABNORMAL LOW (ref 4.0–10.5)
nRBC: 0 % (ref 0.0–0.2)

## 2023-09-14 LAB — CMP (CANCER CENTER ONLY)
ALT: 25 U/L (ref 0–44)
AST: 30 U/L (ref 15–41)
Albumin: 4.4 g/dL (ref 3.5–5.0)
Alkaline Phosphatase: 69 U/L (ref 38–126)
Anion gap: 4 — ABNORMAL LOW (ref 5–15)
BUN: 11 mg/dL (ref 6–20)
CO2: 30 mmol/L (ref 22–32)
Calcium: 10 mg/dL (ref 8.9–10.3)
Chloride: 105 mmol/L (ref 98–111)
Creatinine: 0.77 mg/dL (ref 0.44–1.00)
GFR, Estimated: >60 mL/min (ref >60)
Glucose, Bld: 79 mg/dL (ref 70–99)
Potassium: 4.5 mmol/L (ref 3.5–5.1)
Sodium: 139 mmol/L (ref 135–145)
Total Bilirubin: 0.6 mg/dL (ref 0.3–1.2)
Total Protein: 6.9 g/dL (ref 6.5–8.1)

## 2023-09-14 NOTE — Progress Notes (Signed)
  [facility] OFFICE PROGRESS NOTE   Diagnosis: Gastrointestinal stromal tumor  INTERVAL HISTORY:   Paige Robertson returns as scheduled.  She feels well.  She continues Gleevec.  No rash, swelling, or diarrhea.  She reports mild alopecia.  Objective:  Vital signs in last 24 hours:  Blood pressure (!) 88/58, pulse 67, temperature 98.1 F (36.7 C), temperature source Temporal, resp. rate 18, height 5\' 2"  (1.575 m), weight 125 lb (56.7 kg), SpO2 100%.    HEENT: No thrush or ulcers Resp: Distant breath sounds, no respiratory distress Cardio: Regular rate and rhythm GI: No hepatosplenomegaly, no mass, nontender Vascular: No leg edema   Lab Results:  Lab Results  Component Value Date   WBC 3.9 (L) 09/14/2023   HGB 12.5 09/14/2023   HCT 36.9 09/14/2023   MCV 95.8 09/14/2023   PLT 158 09/14/2023   NEUTROABS 2.0 09/14/2023    CMP  Lab Results  Component Value Date   NA 139 07/14/2023   K 4.2 07/14/2023   CL 103 07/14/2023   CO2 31 07/14/2023   GLUCOSE 79 07/14/2023   BUN 10 07/14/2023   CREATININE 0.84 07/14/2023   CALCIUM 9.9 07/14/2023   PROT 7.0 07/14/2023   ALBUMIN 4.3 07/14/2023   AST 34 07/14/2023   ALT 33 07/14/2023   ALKPHOS 77 07/14/2023   BILITOT 0.4 07/14/2023   GFRNONAA >60 07/14/2023   GFRAA >60 07/09/2020    Lab Results  Component Value Date   CEA1 0.7 02/18/2021    Lab Results  Component Value Date   INR 1.0 02/07/2021   LABPROT 13.1 02/07/2021    Imaging:  No results found.  Medications: I have reviewed the patient's current medications.   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- 10.9 cm tumor, 1 mitosis per 5 mm, Ki67-less than 1%, CD117 positive, pT4pNx Gleevec starting 03/28/2021 Gleevec held beginning 05/01/2021 secondary to neutropenia Gleevec resumed 200 mg daily 05/06/2021 Gleevec escalated to 300 mg daily 06/20/2021 Gleevec escalated to 300 mg  daily alternating with 400 mg daily 08/29/2021 Gleevec placed on hold 12/05/2021 secondary to nausea and anorexia, resumed at a dose of 300 mg daily 12/12/2021 Gleevec dose reduced to 200 mg daily 12/19/2021 secondary to nausea Gleevec 200 mg daily continued 01/10/2022   History of anemia and iron deficiency secondary to bleeding from #1 Multiple hospital admissions with symptomatic anemia 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  She is tolerating the Gleevec well.  She will continue Gleevec at the current dose.  She will return for an office and lab visit in 2 months.  Thornton Papas, MD  09/14/2023  8:17 AM

## 2023-10-01 ENCOUNTER — Other Ambulatory Visit: Payer: Self-pay

## 2023-10-01 DIAGNOSIS — Z0000 Encounter for general adult medical examination without abnormal findings: Secondary | ICD-10-CM

## 2023-10-02 ENCOUNTER — Other Ambulatory Visit: Payer: Self-pay

## 2023-10-02 DIAGNOSIS — Z0000 Encounter for general adult medical examination without abnormal findings: Secondary | ICD-10-CM

## 2023-10-04 LAB — LIPID PANEL
Chol/HDL Ratio: 3 ratio (ref 0.0–4.4)
Cholesterol, Total: 211 mg/dL — ABNORMAL HIGH (ref 100–199)
HDL: 71 mg/dL (ref >39)
LDL Chol Calc (NIH): 122 mg/dL — ABNORMAL HIGH (ref 0–99)
Triglycerides: 100 mg/dL (ref 0–149)
VLDL Cholesterol Cal: 18 mg/dL (ref 5–40)

## 2023-10-04 LAB — TSH: TSH: 2.63 u[IU]/mL (ref 0.450–4.500)

## 2023-10-20 ENCOUNTER — Other Ambulatory Visit: Payer: Self-pay | Admitting: Obstetrics and Gynecology

## 2023-10-20 DIAGNOSIS — Z1231 Encounter for screening mammogram for malignant neoplasm of breast: Secondary | ICD-10-CM

## 2023-11-19 ENCOUNTER — Other Ambulatory Visit: Payer: BC Managed Care – PPO

## 2023-11-19 ENCOUNTER — Inpatient Hospital Stay: Payer: BC Managed Care – PPO | Attending: Oncology | Admitting: Oncology

## 2023-11-19 ENCOUNTER — Inpatient Hospital Stay: Payer: BC Managed Care – PPO

## 2023-11-19 DIAGNOSIS — Z79899 Other long term (current) drug therapy: Secondary | ICD-10-CM | POA: Diagnosis not present

## 2023-11-19 DIAGNOSIS — C49A3 Gastrointestinal stromal tumor of small intestine: Secondary | ICD-10-CM

## 2023-11-19 LAB — CMP (CANCER CENTER ONLY)
ALT: 26 U/L (ref 0–44)
AST: 28 U/L (ref 15–41)
Albumin: 4.4 g/dL (ref 3.5–5.0)
Alkaline Phosphatase: 71 U/L (ref 38–126)
Anion gap: 4 — ABNORMAL LOW (ref 5–15)
BUN: 11 mg/dL (ref 6–20)
CO2: 28 mmol/L (ref 22–32)
Calcium: 9.5 mg/dL (ref 8.9–10.3)
Chloride: 106 mmol/L (ref 98–111)
Creatinine: 0.7 mg/dL (ref 0.44–1.00)
GFR, Estimated: >60 mL/min (ref >60)
Glucose, Bld: 79 mg/dL (ref 70–99)
Potassium: 3.8 mmol/L (ref 3.5–5.1)
Sodium: 138 mmol/L (ref 135–145)
Total Bilirubin: 0.5 mg/dL (ref <1.2)
Total Protein: 6.8 g/dL (ref 6.5–8.1)

## 2023-11-19 LAB — CBC WITH DIFFERENTIAL (CANCER CENTER ONLY)
Abs Immature Granulocytes: 0.01 10*3/uL (ref 0.00–0.07)
Basophils Absolute: 0 10*3/uL (ref 0.0–0.1)
Basophils Relative: 1 %
Eosinophils Absolute: 0.1 10*3/uL (ref 0.0–0.5)
Eosinophils Relative: 2 %
HCT: 35.7 % — ABNORMAL LOW (ref 36.0–46.0)
Hemoglobin: 11.8 g/dL — ABNORMAL LOW (ref 12.0–15.0)
Immature Granulocytes: 0 %
Lymphocytes Relative: 39 %
Lymphs Abs: 1.3 10*3/uL (ref 0.7–4.0)
MCH: 32 pg (ref 26.0–34.0)
MCHC: 33.1 g/dL (ref 30.0–36.0)
MCV: 96.7 fL (ref 80.0–100.0)
Monocytes Absolute: 0.3 10*3/uL (ref 0.1–1.0)
Monocytes Relative: 7 %
Neutro Abs: 1.7 10*3/uL (ref 1.7–7.7)
Neutrophils Relative %: 51 %
Platelet Count: 160 10*3/uL (ref 150–400)
RBC: 3.69 MIL/uL — ABNORMAL LOW (ref 3.87–5.11)
RDW: 12.9 % (ref 11.5–15.5)
WBC Count: 3.4 10*3/uL — ABNORMAL LOW (ref 4.0–10.5)
nRBC: 0 % (ref 0.0–0.2)

## 2023-11-19 NOTE — Progress Notes (Signed)
  [facility] OFFICE PROGRESS NOTE   Diagnosis: Gastrointestinal stromal tumor  INTERVAL HISTORY:   Ms. Paige Robertson returns as scheduled.  She continues Gleevec.  No diarrhea, swelling, or rash.  No complaint.  She feels well.  Objective:  Vital signs in last 24 hours:  There were no vitals taken for this visit.    HEENT: No thrush Resp: Breath sounds, no respiratory distress Cardio: Regular rate and rhythm GI: No hepatosplenomegaly, no apparent ascites, no mass, nontender   Lab Results:  Lab Results  Component Value Date   WBC 3.4 (L) 11/19/2023   HGB 11.8 (L) 11/19/2023   HCT 35.7 (L) 11/19/2023   MCV 96.7 11/19/2023   PLT 160 11/19/2023   NEUTROABS 1.7 11/19/2023    CMP  Lab Results  Component Value Date   NA 138 11/19/2023   K 3.8 11/19/2023   CL 106 11/19/2023   CO2 28 11/19/2023   GLUCOSE 79 11/19/2023   BUN 11 11/19/2023   CREATININE 0.70 11/19/2023   CALCIUM 9.5 11/19/2023   PROT 6.8 11/19/2023   ALBUMIN 4.4 11/19/2023   AST 28 11/19/2023   ALT 26 11/19/2023   ALKPHOS 71 11/19/2023   BILITOT 0.5 11/19/2023   GFRNONAA >60 11/19/2023   GFRAA >60 07/09/2020    Lab Results  Component Value Date   CEA1 0.7 02/18/2021    Medications: I have reviewed the patient's current medications.   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- 10.9 cm tumor, 1 mitosis per 5 mm, Ki67-less than 1%, CD117 positive, pT4pNx Gleevec starting 03/28/2021 Gleevec held beginning 05/01/2021 secondary to neutropenia Gleevec resumed 200 mg daily 05/06/2021 Gleevec escalated to 300 mg daily 06/20/2021 Gleevec escalated to 300 mg daily alternating with 400 mg daily 08/29/2021 Gleevec placed on hold 12/05/2021 secondary to nausea and anorexia, resumed at a dose of 300 mg daily 12/12/2021 Gleevec dose reduced to 200 mg daily 12/19/2021 secondary to nausea Gleevec 200 mg daily continued 01/10/2022    History of anemia and iron deficiency secondary to bleeding from #1 Multiple hospital admissions with symptomatic anemia 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 Paige Robertson appears stable.  She is tolerating the Gleevec well.  There is no clinical evidence for progression of the gastrointestinal stromal tumor.  She will continue Gleevec.  She will return for an office and lab visit in 2 months.  Thornton Papas, MD  11/19/2023  8:49 AM

## 2023-11-23 ENCOUNTER — Other Ambulatory Visit: Payer: Self-pay | Admitting: Oncology

## 2023-12-01 ENCOUNTER — Encounter: Payer: Self-pay | Admitting: Oncology

## 2023-12-01 ENCOUNTER — Other Ambulatory Visit: Payer: Self-pay | Admitting: *Deleted

## 2023-12-01 MED ORDER — IMATINIB MESYLATE 100 MG PO TABS: 200.0000 mg | ORAL_TABLET | Freq: Every day | ORAL | 2 refills | Status: DC

## 2023-12-01 NOTE — Progress Notes (Signed)
Paige Robertson reports new insurance in January will require her Gleevec to be filled at [facility]. Script sent to allow them to start process.

## 2023-12-24 ENCOUNTER — Ambulatory Visit
Admission: RE | Admit: 2023-12-24 | Discharge: 2023-12-24 | Disposition: A | Discharge status: Home / Self Care | Payer: BC Managed Care – PPO | Source: Ambulatory Visit | Attending: Obstetrics and Gynecology | Admitting: Obstetrics and Gynecology

## 2023-12-24 DIAGNOSIS — Z1231 Encounter for screening mammogram for malignant neoplasm of breast: Secondary | ICD-10-CM | POA: Insufficient documentation

## 2024-01-21 ENCOUNTER — Ambulatory Visit: Payer: BC Managed Care – PPO | Admitting: Oncology

## 2024-01-21 ENCOUNTER — Other Ambulatory Visit: Payer: BC Managed Care – PPO

## 2024-01-22 ENCOUNTER — Inpatient Hospital Stay: Payer: BC Managed Care – PPO | Attending: Oncology

## 2024-01-22 ENCOUNTER — Encounter: Payer: Self-pay | Admitting: Oncology

## 2024-01-22 ENCOUNTER — Inpatient Hospital Stay: Payer: BC Managed Care – PPO | Admitting: Oncology

## 2024-01-22 VITALS — BP 95/61 | HR 67 | Temp 98.1°F | Resp 18 | Ht 62.0 in | Wt 127.8 lb

## 2024-01-22 DIAGNOSIS — C49A3 Gastrointestinal stromal tumor of small intestine: Secondary | ICD-10-CM

## 2024-01-22 LAB — CMP (CANCER CENTER ONLY)
ALT: 56 U/L — ABNORMAL HIGH (ref 0–44)
AST: 48 U/L — ABNORMAL HIGH (ref 15–41)
Albumin: 4.8 g/dL (ref 3.5–5.0)
Alkaline Phosphatase: 81 U/L (ref 38–126)
Anion gap: 4 — ABNORMAL LOW (ref 5–15)
BUN: 15 mg/dL (ref 6–20)
CO2: 30 mmol/L (ref 22–32)
Calcium: 9.9 mg/dL (ref 8.9–10.3)
Chloride: 105 mmol/L (ref 98–111)
Creatinine: 0.79 mg/dL (ref 0.44–1.00)
GFR, Estimated: >60 mL/min (ref >60)
Glucose, Bld: 93 mg/dL (ref 70–99)
Potassium: 4.1 mmol/L (ref 3.5–5.1)
Sodium: 139 mmol/L (ref 135–145)
Total Bilirubin: 0.6 mg/dL (ref 0.0–1.2)
Total Protein: 7.4 g/dL (ref 6.5–8.1)

## 2024-01-22 LAB — CBC WITH DIFFERENTIAL (CANCER CENTER ONLY)
Abs Immature Granulocytes: 0.01 10*3/uL (ref 0.00–0.07)
Basophils Absolute: 0 10*3/uL (ref 0.0–0.1)
Basophils Relative: 1 %
Eosinophils Absolute: 0.1 10*3/uL (ref 0.0–0.5)
Eosinophils Relative: 3 %
HCT: 37.3 % (ref 36.0–46.0)
Hemoglobin: 12.5 g/dL (ref 12.0–15.0)
Immature Granulocytes: 0 %
Lymphocytes Relative: 44 %
Lymphs Abs: 1.6 10*3/uL (ref 0.7–4.0)
MCH: 32 pg (ref 26.0–34.0)
MCHC: 33.5 g/dL (ref 30.0–36.0)
MCV: 95.4 fL (ref 80.0–100.0)
Monocytes Absolute: 0.2 10*3/uL (ref 0.1–1.0)
Monocytes Relative: 6 %
Neutro Abs: 1.6 10*3/uL — ABNORMAL LOW (ref 1.7–7.7)
Neutrophils Relative %: 46 %
Platelet Count: 153 10*3/uL (ref 150–400)
RBC: 3.91 MIL/uL (ref 3.87–5.11)
RDW: 12.7 % (ref 11.5–15.5)
WBC Count: 3.6 10*3/uL — ABNORMAL LOW (ref 4.0–10.5)
nRBC: 0 % (ref 0.0–0.2)

## 2024-01-22 NOTE — Progress Notes (Signed)
  [facility] OFFICE PROGRESS NOTE   Diagnosis: Gastrointestinal stromal tumor  INTERVAL HISTORY:   [name] returns as scheduled.  She continues imatinib.  No rash, swelling, nausea, or diarrhea.  No complaint.  Objective:  Vital signs in last 24 hours:  Blood pressure 95/61, pulse 67, temperature 98.1 F (36.7 C), temperature source Temporal, resp. rate 18, height 5\' 2"  (1.575 m), weight 127 lb 12.8 oz (58 kg), SpO2 100%.     Resp: Distant breath sounds, no respiratory distress Cardio: Regular rate and rhythm GI: No mass, no hepatosplenomegaly, nontender Vascular: No leg edema     Lab Results:  Lab Results  Component Value Date   WBC 3.6 (L) 01/22/2024   HGB 12.5 01/22/2024   HCT 37.3 01/22/2024   MCV 95.4 01/22/2024   PLT 153 01/22/2024   NEUTROABS 1.6 (L) 01/22/2024    CMP  Lab Results  Component Value Date   NA 139 01/22/2024   K 4.1 01/22/2024   CL 105 01/22/2024   CO2 30 01/22/2024   GLUCOSE 93 01/22/2024   BUN 15 01/22/2024   CREATININE 0.79 01/22/2024   CALCIUM 9.9 01/22/2024   PROT 7.4 01/22/2024   ALBUMIN 4.8 01/22/2024   AST 48 (H) 01/22/2024   ALT 56 (H) 01/22/2024   ALKPHOS 81 01/22/2024   BILITOT 0.6 01/22/2024   GFRNONAA >60 01/22/2024   GFRAA >60 07/09/2020    Lab Results  Component Value Date   CEA1 0.7 02/18/2021    Medications: I have reviewed the patient's current medications.   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- 10.9 cm tumor, 1 mitosis per 5 mm, Ki67-less than 1%, CD117 positive, pT4pNx Gleevec starting 03/28/2021 Gleevec held beginning 05/01/2021 secondary to neutropenia Gleevec resumed 200 mg daily 05/06/2021 Gleevec escalated to 300 mg daily 06/20/2021 Gleevec escalated to 300 mg daily alternating with 400 mg daily 08/29/2021 Gleevec placed on hold 12/05/2021 secondary to nausea and anorexia, resumed at a dose of 300 mg daily  12/12/2021 Gleevec dose reduced to 200 mg daily 12/19/2021 secondary to nausea Gleevec 200 mg daily continued 01/10/2022   History of anemia and iron deficiency secondary to bleeding from #1 Multiple hospital admissions with symptomatic anemia 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  She is in clinical remission from the gastrointestinal stromal tumor.  She will complete the course of Gleevec in April of this year.  She has mild neutropenia.  She will call for a fever or symptoms of infection.  The liver enzymes are mildly elevated.  This is likely secondary to imatinib.  The liver enzymes have been intermittently elevated over the past few years. She will return for an office and lab visit in 6 weeks. Paige Papas, MD  01/22/2024  8:46 AM

## 2024-03-03 ENCOUNTER — Inpatient Hospital Stay: Payer: BC Managed Care – PPO | Attending: Oncology

## 2024-03-03 ENCOUNTER — Inpatient Hospital Stay: Payer: BC Managed Care – PPO | Admitting: Oncology

## 2024-03-03 VITALS — BP 92/63 | HR 63 | Temp 98.2°F | Resp 18 | Ht 62.0 in | Wt 126.2 lb

## 2024-03-03 DIAGNOSIS — C49A3 Gastrointestinal stromal tumor of small intestine: Secondary | ICD-10-CM

## 2024-03-03 DIAGNOSIS — Z8616 Personal history of COVID-19: Secondary | ICD-10-CM | POA: Diagnosis not present

## 2024-03-03 LAB — CBC WITH DIFFERENTIAL (CANCER CENTER ONLY)
Abs Immature Granulocytes: 0 10*3/uL (ref 0.00–0.07)
Basophils Absolute: 0 10*3/uL (ref 0.0–0.1)
Basophils Relative: 1 %
Eosinophils Absolute: 0.1 10*3/uL (ref 0.0–0.5)
Eosinophils Relative: 3 %
HCT: 36.6 % (ref 36.0–46.0)
Hemoglobin: 12.3 g/dL (ref 12.0–15.0)
Immature Granulocytes: 0 %
Lymphocytes Relative: 41 %
Lymphs Abs: 1.8 10*3/uL (ref 0.7–4.0)
MCH: 31.9 pg (ref 26.0–34.0)
MCHC: 33.6 g/dL (ref 30.0–36.0)
MCV: 95.1 fL (ref 80.0–100.0)
Monocytes Absolute: 0.2 10*3/uL (ref 0.1–1.0)
Monocytes Relative: 5 %
Neutro Abs: 2.2 10*3/uL (ref 1.7–7.7)
Neutrophils Relative %: 50 %
Platelet Count: 162 10*3/uL (ref 150–400)
RBC: 3.85 MIL/uL — ABNORMAL LOW (ref 3.87–5.11)
RDW: 12.7 % (ref 11.5–15.5)
WBC Count: 4.3 10*3/uL (ref 4.0–10.5)
nRBC: 0 % (ref 0.0–0.2)

## 2024-03-03 LAB — CMP (CANCER CENTER ONLY)
ALT: 34 U/L (ref 0–44)
AST: 35 U/L (ref 15–41)
Albumin: 4.4 g/dL (ref 3.5–5.0)
Alkaline Phosphatase: 75 U/L (ref 38–126)
Anion gap: 5 (ref 5–15)
BUN: 22 mg/dL — ABNORMAL HIGH (ref 6–20)
CO2: 30 mmol/L (ref 22–32)
Calcium: 9.8 mg/dL (ref 8.9–10.3)
Chloride: 106 mmol/L (ref 98–111)
Creatinine: 0.85 mg/dL (ref 0.44–1.00)
GFR, Estimated: >60 mL/min (ref >60)
Glucose, Bld: 83 mg/dL (ref 70–99)
Potassium: 4.1 mmol/L (ref 3.5–5.1)
Sodium: 141 mmol/L (ref 135–145)
Total Bilirubin: 0.3 mg/dL (ref 0.0–1.2)
Total Protein: 7 g/dL (ref 6.5–8.1)

## 2024-03-03 NOTE — Progress Notes (Signed)
  [facility] OFFICE PROGRESS NOTE   Diagnosis: Gastrointestinal stromal tumor  INTERVAL HISTORY:   Paige Robertson returns as scheduled she continues Gleevec.  She feels well.  No rash, diarrhea, or swelling.  No difficulty with bowel function.  Objective:  Vital signs in last 24 hours:  Blood pressure 92/63, pulse 63, temperature 98.2 F (36.8 C), temperature source Temporal, resp. rate 18, height 5\' 2"  (1.575 m), weight 126 lb 3.2 oz (57.2 kg), SpO2 100%.   Lymphatics: No cervical, supraclavicular, axillary, or inguinal nodes Resp: Lungs clear bilaterally, distant breath sounds, no respiratory distress Cardio: Regular rate and rhythm GI: No mass, nontender, no hepatosplenomegaly Vascular: No leg edema  Lab Results:  Lab Results  Component Value Date   WBC 4.3 03/03/2024   HGB 12.3 03/03/2024   HCT 36.6 03/03/2024   MCV 95.1 03/03/2024   PLT 162 03/03/2024   NEUTROABS 2.2 03/03/2024    CMP  Lab Results  Component Value Date   NA 141 03/03/2024   K 4.1 03/03/2024   CL 106 03/03/2024   CO2 30 03/03/2024   GLUCOSE 83 03/03/2024   BUN 22 (H) 03/03/2024   CREATININE 0.85 03/03/2024   CALCIUM 9.8 03/03/2024   PROT 7.0 03/03/2024   ALBUMIN 4.4 03/03/2024   AST 35 03/03/2024   ALT 34 03/03/2024   ALKPHOS 75 03/03/2024   BILITOT 0.3 03/03/2024   GFRNONAA >60 03/03/2024   GFRAA >60 07/09/2020    Lab Results  Component Value Date   CEA1 0.7 02/18/2021      Medications: I have reviewed the patient's current medications.   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- 10.9 cm tumor, 1 mitosis per 5 mm, Ki67-less than 1%, CD117 positive, pT4pNx Gleevec starting 03/28/2021 Gleevec held beginning 05/01/2021 secondary to neutropenia Gleevec resumed 200 mg daily 05/06/2021 Gleevec escalated to 300 mg daily 06/20/2021 Gleevec escalated to 300 mg daily alternating with 400 mg daily  08/29/2021 Gleevec placed on hold 12/05/2021 secondary to nausea and anorexia, resumed at a dose of 300 mg daily 12/12/2021 Gleevec dose reduced to 200 mg daily 12/19/2021 secondary to nausea Gleevec 200 mg daily continued 01/10/2022 Gleevec discontinued 03/11/2024   History of anemia and iron deficiency secondary to bleeding from #1 Multiple hospital admissions with symptomatic anemia 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  She has completed 3 years of adjuvant Gleevec.  She will discontinue Gleevec when the current supply ends on 03/11/2024.  She will be referred for a surveillance CT abdomen/pelvis next month.  She will return for an office visit in 3 months.  Thornton Papas, MD  03/03/2024  8:35 AM

## 2024-03-05 ENCOUNTER — Telehealth: Payer: Self-pay | Admitting: Oncology

## 2024-03-05 NOTE — Telephone Encounter (Signed)
 Patient has been scheduled for follow-up visit per 03/03/24 LOS.  Pt aware of appt details

## 2024-04-06 ENCOUNTER — Encounter: Payer: Self-pay | Admitting: Oncology

## 2024-04-06 ENCOUNTER — Other Ambulatory Visit: Payer: Self-pay | Admitting: *Deleted

## 2024-04-06 DIAGNOSIS — C49A3 Gastrointestinal stromal tumor of small intestine: Secondary | ICD-10-CM

## 2024-04-12 ENCOUNTER — Ambulatory Visit (HOSPITAL_BASED_OUTPATIENT_CLINIC_OR_DEPARTMENT_OTHER)

## 2024-05-02 ENCOUNTER — Other Ambulatory Visit: Payer: Self-pay | Admitting: *Deleted

## 2024-05-02 ENCOUNTER — Inpatient Hospital Stay
Admission: RE | Admit: 2024-05-02 | Discharge: 2024-05-02 | Disposition: A | Discharge status: Home / Self Care | Payer: Self-pay | Source: Ambulatory Visit | Attending: Nurse Practitioner | Admitting: Nurse Practitioner

## 2024-05-02 DIAGNOSIS — C49A3 Gastrointestinal stromal tumor of small intestine: Secondary | ICD-10-CM

## 2024-05-02 NOTE — Progress Notes (Signed)
 Requested [facility] 04/27/24 images be uploaded to [facility] w/radiology

## 2024-05-12 ENCOUNTER — Other Ambulatory Visit (HOSPITAL_BASED_OUTPATIENT_CLINIC_OR_DEPARTMENT_OTHER): Payer: Self-pay

## 2024-05-29 ENCOUNTER — Other Ambulatory Visit: Payer: Self-pay

## 2024-05-29 ENCOUNTER — Emergency Department

## 2024-05-29 ENCOUNTER — Encounter: Payer: Self-pay | Admitting: Emergency Medicine

## 2024-05-29 ENCOUNTER — Emergency Department
Admission: EM | Admit: 2024-05-29 | Discharge: 2024-05-30 | Disposition: A | Discharge status: Home / Self Care | Attending: Emergency Medicine | Admitting: Emergency Medicine

## 2024-05-29 DIAGNOSIS — K8050 Calculus of bile duct without cholangitis or cholecystitis without obstruction: Secondary | ICD-10-CM | POA: Diagnosis not present

## 2024-05-29 DIAGNOSIS — R1010 Upper abdominal pain, unspecified: Secondary | ICD-10-CM | POA: Diagnosis present

## 2024-05-29 LAB — LIPASE, BLOOD: Lipase: 36 U/L (ref 11–51)

## 2024-05-29 LAB — COMPREHENSIVE METABOLIC PANEL WITH GFR
ALT: 21 U/L (ref 0–44)
AST: 25 U/L (ref 15–41)
Albumin: 4.3 g/dL (ref 3.5–5.0)
Alkaline Phosphatase: 83 U/L (ref 38–126)
Anion gap: 10 (ref 5–15)
BUN: 13 mg/dL (ref 6–20)
CO2: 24 mmol/L (ref 22–32)
Calcium: 10.4 mg/dL — ABNORMAL HIGH (ref 8.9–10.3)
Chloride: 103 mmol/L (ref 98–111)
Creatinine, Ser: 0.6 mg/dL (ref 0.44–1.00)
GFR, Estimated: >60 mL/min (ref >60)
Glucose, Bld: 107 mg/dL — ABNORMAL HIGH (ref 70–99)
Potassium: 3.5 mmol/L (ref 3.5–5.1)
Sodium: 137 mmol/L (ref 135–145)
Total Bilirubin: 0.8 mg/dL (ref 0.0–1.2)
Total Protein: 7.6 g/dL (ref 6.5–8.1)

## 2024-05-29 LAB — CBC WITH DIFFERENTIAL/PLATELET
Abs Immature Granulocytes: 0.04 10*3/uL (ref 0.00–0.07)
Basophils Absolute: 0 10*3/uL (ref 0.0–0.1)
Basophils Relative: 0 %
Eosinophils Absolute: 0.1 10*3/uL (ref 0.0–0.5)
Eosinophils Relative: 1 %
HCT: 38.4 % (ref 36.0–46.0)
Hemoglobin: 12.9 g/dL (ref 12.0–15.0)
Immature Granulocytes: 0 %
Lymphocytes Relative: 15 %
Lymphs Abs: 1.4 10*3/uL (ref 0.7–4.0)
MCH: 31.7 pg (ref 26.0–34.0)
MCHC: 33.6 g/dL (ref 30.0–36.0)
MCV: 94.3 fL (ref 80.0–100.0)
Monocytes Absolute: 0.6 10*3/uL (ref 0.1–1.0)
Monocytes Relative: 6 %
Neutro Abs: 7.4 10*3/uL (ref 1.7–7.7)
Neutrophils Relative %: 78 %
Platelets: 198 10*3/uL (ref 150–400)
RBC: 4.07 MIL/uL (ref 3.87–5.11)
RDW: 12.4 % (ref 11.5–15.5)
WBC: 9.6 10*3/uL (ref 4.0–10.5)
nRBC: 0 % (ref 0.0–0.2)

## 2024-05-29 LAB — URINALYSIS, W/ REFLEX TO CULTURE (INFECTION SUSPECTED)
Bacteria, UA: NONE SEEN
Bilirubin Urine: NEGATIVE
Glucose, UA: NEGATIVE mg/dL
Ketones, ur: 20 mg/dL — AB
Leukocytes,Ua: NEGATIVE
Nitrite: NEGATIVE
Protein, ur: NEGATIVE mg/dL
Specific Gravity, Urine: 1.011 (ref 1.005–1.030)
WBC, UA: 0 WBC/hpf (ref 0–5)
pH: 7 (ref 5.0–8.0)

## 2024-05-29 LAB — PREGNANCY, URINE: Preg Test, Ur: NEGATIVE

## 2024-05-29 MED ORDER — KETOROLAC TROMETHAMINE 15 MG/ML IJ SOLN
15.0000 mg | Freq: Once | INTRAMUSCULAR | Status: AC
  Administered 2024-05-29: 15 mg via INTRAVENOUS
  Filled 2024-05-29: qty 1

## 2024-05-29 MED ORDER — ONDANSETRON HCL 4 MG/2ML IJ SOLN
4.0000 mg | Freq: Once | INTRAMUSCULAR | Status: AC
  Administered 2024-05-29: 4 mg via INTRAVENOUS
  Filled 2024-05-29: qty 2

## 2024-05-29 MED ORDER — SODIUM CHLORIDE 0.9 % IV BOLUS
1000.0000 mL | Freq: Once | INTRAVENOUS | Status: AC
  Administered 2024-05-29: 1000 mL via INTRAVENOUS

## 2024-05-29 NOTE — ED Triage Notes (Signed)
 Pt arrived via POV with reports of upper abdominal pain since this morning, pt states the pain has worsened over the day, pt states the pain started almost immediately after eating a sausage biscuit this morning.   Pt was on an oral chemo pill for CA in the stomach, pt has been off of it for about 2 months.

## 2024-05-29 NOTE — ED Provider Notes (Signed)
 [facility] Provider Note    Event Date/Time   First MD Initiated Contact with Patient 05/29/24 2040     (approximate)   History   Chief Complaint: Abdominal Pain   HPI  Paige Robertson is a 48 y.o. female with a history of GERD who comes ED complaining of upper abdominal pain since this morning which started after eating a sausage biscuit.  Upper abdominal pain and nausea has continued throughout the day, unable to eat or drink anything.  Denies chest pain shortness of breath or fever.  Father had gallstones.  Patient has a history of GIST tumor, status post surgery and chemo with recent CT surveillance negative.        Past Medical History:  Diagnosis Date   Acid reflux    Hematuria    Hyperlipemia    IDA (iron  deficiency anemia)    Melena    Perimenopausal symptoms    Vaginal discharge     Current Outpatient Rx   Order #: [id number] Class: OTC   Order #: [id number] Class: Normal   Order #: [id number] Class: Historical Med   Order #: [id number] Class: Normal   Order #: [id number] Class: OTC   Order #: [id number] Class: Normal   Order #: [id number] Class: Normal    Past Surgical History:  Procedure Laterality Date   BOWEL RESECTION N/A 02/18/2021   Procedure: SMALL BOWEL RESECTION;  Surgeon: Sebastian Moles, MD;  Location: [facility] OR;  Service: General;  Laterality: N/A;   COLONOSCOPY WITH ESOPHAGOGASTRODUODENOSCOPY (EGD)     ENTEROSCOPY N/A 07/09/2020   Procedure: ENTEROSCOPY;  Surgeon: Unk Corinn Skiff, MD;  Location: [facility] ENDOSCOPY;  Service: Gastroenterology;  Laterality: N/A;   ENTEROSCOPY N/A 02/07/2021   Procedure: ENTEROSCOPY;  Surgeon: Unk Corinn Skiff, MD;  Location: [facility] ENDOSCOPY;  Service: Gastroenterology;  Laterality: N/A;   ENTEROSCOPY N/A 02/13/2021   Procedure: ENTEROSCOPY;  Surgeon: Rollin Dover, MD;  Location: [facility] ENDOSCOPY;  Service: Endoscopy;  Laterality: N/A;   ESOPHAGOGASTRODUODENOSCOPY (EGD) WITH PROPOFOL  N/A 07/06/2020    Procedure: ESOPHAGOGASTRODUODENOSCOPY (EGD) WITH PROPOFOL ;  Surgeon: Jinny Carmine, MD;  Location: [facility] ENDOSCOPY;  Service: Endoscopy;  Laterality: N/A;   ESOPHAGOGASTRODUODENOSCOPY (EGD) WITH PROPOFOL  N/A 11/19/2020   Procedure: ESOPHAGOGASTRODUODENOSCOPY (EGD) WITH PROPOFOL ;  Surgeon: Toledo, Ladell POUR, MD;  Location: [facility] ENDOSCOPY;  Service: Gastroenterology;  Laterality: N/A;   GIVENS CAPSULE STUDY N/A 07/08/2020   Procedure: GIVENS CAPSULE STUDY;  Surgeon: Janalyn Keene NOVAK, MD;  Location: [facility] ENDOSCOPY;  Service: Endoscopy;  Laterality: N/A;  Administer between 6-8 pm today   HEMOSTASIS CLIP PLACEMENT  02/13/2021   Procedure: HEMOSTASIS CLIP PLACEMENT;  Surgeon: Rollin Dover, MD;  Location: [facility] ENDOSCOPY;  Service: Endoscopy;;   LAPAROTOMY N/A 02/18/2021   Procedure: RESECTION PELVIC MASS;  Surgeon: Sebastian Moles, MD;  Location: [facility] OR;  Service: General;  Laterality: N/A;   NO PAST SURGERIES     SCLEROTHERAPY  02/13/2021   Procedure: MATIAS;  Surgeon: Rollin Dover, MD;  Location: [facility] ENDOSCOPY;  Service: Endoscopy;;    Physical Exam   Triage Vital Signs: ED Triage Vitals  Encounter Vitals Group     BP 05/29/24 2019 115/72     Girls Systolic BP Percentile --      Girls Diastolic BP Percentile --      Boys Systolic BP Percentile --      Boys Diastolic BP Percentile --      Pulse Rate 05/29/24 2019 81     Resp 05/29/24 2019 18     Temp 05/29/24 2019 98 F (  36.7 C)     Temp Source 05/29/24 2019 Oral     SpO2 05/29/24 2019 100 %     Weight 05/29/24 2022 125 lb (56.7 kg)     Height 05/29/24 2022 5' 1 (1.549 m)     Head Circumference --      Peak Flow --      Pain Score 05/29/24 2020 10     Pain Loc --      Pain Education --      Exclude from Growth Chart --     Most recent vital signs: Vitals:   05/29/24 2200 05/29/24 2230  BP: (!) 98/46 104/62  Pulse: 74 79  Resp: 17 17  Temp:    SpO2: 100% 100%    General: Awake, no distress.  CV:  Good peripheral  perfusion.  Regular rate rhythm Resp:  Normal effort.  Clear lungs Abd:  No distention.  Soft with diffuse upper abdominal tenderness Other:  Moist mucosa.   ED Results / Procedures / Treatments   Labs (all labs ordered are listed, but only abnormal results are displayed) Labs Reviewed  COMPREHENSIVE METABOLIC PANEL WITH GFR - Abnormal; Notable for the following components:      Result Value   Glucose, Bld 107 (*)    Calcium 10.4 (*)    All other components within normal limits  URINALYSIS, W/ REFLEX TO CULTURE (INFECTION SUSPECTED) - Abnormal; Notable for the following components:   Color, Urine YELLOW (*)    APPearance CLEAR (*)    Hgb urine dipstick SMALL (*)    Ketones, ur 20 (*)    All other components within normal limits  LIPASE, BLOOD  CBC WITH DIFFERENTIAL/PLATELET  PREGNANCY, URINE     EKG    RADIOLOGY Ultrasound right upper quadrant interpreted by me, shows 1.6 cm gallstone, mobile in the gallbladder, without signs of cholecystitis.  Normal CBD.   PROCEDURES:  Procedures   MEDICATIONS ORDERED IN ED: Medications  sodium chloride  0.9 % bolus 1,000 mL (0 mLs Intravenous Stopped 05/29/24 2301)  ondansetron  (ZOFRAN ) injection 4 mg (4 mg Intravenous Given 05/29/24 2113)  ketorolac (TORADOL) 15 MG/ML injection 15 mg (15 mg Intravenous Given 05/29/24 2114)     IMPRESSION / MDM / ASSESSMENT AND PLAN / ED COURSE  I reviewed the triage vital signs and the nursing notes.  DDx: Gastritis, pancreatitis, cholecystitis, viral illness  Patient's presentation is most consistent with acute presentation with potential threat to life or bodily function.  Patient presents with upper abdominal pain and tenderness.  Will check labs, ultrasound right upper quadrant.    ----------------------------------------- 12:00 AM on 05/30/2024 ----------------------------------------- Vitals remain normal.  Labs are normal.  Ultrasound reveals uncomplicated cholelithiasis.   Discussed with general surgery who recommends close outpatient follow-up in office if tolerating oral intake.  Results and plan of care discussed with patient.     FINAL CLINICAL IMPRESSION(S) / ED DIAGNOSES   Final diagnoses:  Biliary colic     Rx / DC Orders   ED Discharge Orders     None        Note:  This document was prepared using Dragon voice recognition software and may include unintentional dictation errors.   Viviann Pastor, MD 05/30/24 0000

## 2024-05-30 ENCOUNTER — Encounter: Payer: Self-pay | Admitting: Oncology

## 2024-05-30 ENCOUNTER — Telehealth: Payer: Self-pay | Admitting: *Deleted

## 2024-05-30 MED ORDER — MORPHINE SULFATE (PF) 4 MG/ML IV SOLN
4.0000 mg | Freq: Once | INTRAVENOUS | Status: AC
  Administered 2024-05-30: 4 mg via INTRAVENOUS
  Filled 2024-05-30: qty 1

## 2024-05-30 MED ORDER — OXYCODONE-ACETAMINOPHEN 5-325 MG PO TABS
1.0000 | ORAL_TABLET | Freq: Once | ORAL | Status: AC
  Administered 2024-05-30: 1 via ORAL
  Filled 2024-05-30: qty 1

## 2024-05-30 MED ORDER — ONDANSETRON 4 MG PO TBDP: 4.0000 mg | ORAL_TABLET | Freq: Three times a day (TID) | ORAL | 0 refills | Status: DC | PRN

## 2024-05-30 MED ORDER — OXYCODONE-ACETAMINOPHEN 5-325 MG PO TABS: 1.0000 | ORAL_TABLET | ORAL | 0 refills | Status: DC | PRN

## 2024-05-30 MED ORDER — TRAMADOL HCL 50 MG PO TABS: 50.0000 mg | ORAL_TABLET | Freq: Four times a day (QID) | ORAL | 0 refills | Status: DC | PRN

## 2024-05-30 MED ORDER — ONDANSETRON HCL 4 MG/2ML IJ SOLN
4.0000 mg | Freq: Once | INTRAMUSCULAR | Status: AC
  Administered 2024-05-30: 4 mg via INTRAVENOUS
  Filled 2024-05-30: qty 2

## 2024-05-30 MED ORDER — NAPROXEN 500 MG PO TABS: 500.0000 mg | ORAL_TABLET | Freq: Two times a day (BID) | ORAL | 0 refills | Status: DC | PRN

## 2024-05-30 MED ORDER — CHLORHEXIDINE GLUCONATE 0.12 % MT SOLN
15.0000 mL | Freq: Once | OROMUCOSAL | Status: AC
  Administered 2024-05-31: 15 mL via OROMUCOSAL

## 2024-05-30 MED ORDER — LACTATED RINGERS IV SOLN: INTRAVENOUS | Status: DC

## 2024-05-30 MED ORDER — ORAL CARE MOUTH RINSE: 15.0000 mL | Freq: Once | OROMUCOSAL | Status: AC

## 2024-05-30 NOTE — Telephone Encounter (Signed)
 Called to report ER visit over the weekend and has gallstones. Sees surgeon tomorrow and may have surgery (Dr. Lucas Poll) at [facility]. MD notified.

## 2024-05-30 NOTE — Discharge Instructions (Addendum)
1. Take medicines as needed for pain & nausea (Percocet/Zofran #20). 2. Clear liquids x 12 hours, then bland diet x 1 week, then slowly advance diet as tolerated. Avoid fatty, greasy, spicy foods and drinks. 3. Return to the ER for worsening symptoms, persistent vomiting, fever, difficulty breathing or other concerns.

## 2024-05-30 NOTE — ED Notes (Signed)
 Attempted PO challenge and pt is unable to tolerate PO crackers and water

## 2024-05-30 NOTE — ED Provider Notes (Signed)
-----------------------------------------   1:55 AM on 05/30/2024 -----------------------------------------   Patient tolerated PO without emesis.  Pain returned.  Patient received IV Morphine  and oral Percocet with significant improvement in pain.  She will follow-up closely with general surgery.  Strict return precautions given.  Patient verbalized understanding and agree with plan of care.   Kijana Cromie J, MD 05/30/24 [phone]

## 2024-05-31 ENCOUNTER — Other Ambulatory Visit: Payer: Self-pay

## 2024-05-31 ENCOUNTER — Ambulatory Visit: Admitting: Urgent Care

## 2024-05-31 ENCOUNTER — Encounter: Payer: Self-pay | Admitting: General Surgery

## 2024-05-31 ENCOUNTER — Ambulatory Visit: Payer: Self-pay | Admitting: General Surgery

## 2024-05-31 ENCOUNTER — Ambulatory Visit
Admission: RE | Admit: 2024-05-31 | Discharge: 2024-05-31 | Disposition: A | Discharge status: Home / Self Care | Attending: General Surgery | Admitting: General Surgery

## 2024-05-31 ENCOUNTER — Encounter
Admission: RE | Disposition: A | Discharge status: Home / Self Care | Payer: Self-pay | Source: Home / Self Care | Attending: General Surgery

## 2024-05-31 DIAGNOSIS — K8062 Calculus of gallbladder and bile duct with acute cholecystitis without obstruction: Secondary | ICD-10-CM | POA: Diagnosis present

## 2024-05-31 DIAGNOSIS — K8010 Calculus of gallbladder with chronic cholecystitis without obstruction: Secondary | ICD-10-CM | POA: Diagnosis not present

## 2024-05-31 DIAGNOSIS — D62 Acute posthemorrhagic anemia: Secondary | ICD-10-CM

## 2024-05-31 LAB — POCT PREGNANCY, URINE: Preg Test, Ur: NEGATIVE

## 2024-05-31 SURGERY — CHOLECYSTECTOMY, ROBOT-ASSISTED, LAPAROSCOPIC
Anesthesia: General | Site: Abdomen

## 2024-05-31 MED ORDER — FENTANYL CITRATE (PF) 100 MCG/2ML IJ SOLN
25.0000 ug | INTRAMUSCULAR | Status: DC | PRN
  Administered 2024-05-31: 50 ug via INTRAVENOUS
  Administered 2024-05-31 (×2): 25 ug via INTRAVENOUS
  Administered 2024-05-31: 50 ug via INTRAVENOUS

## 2024-05-31 MED ORDER — FENTANYL CITRATE (PF) 100 MCG/2ML IJ SOLN
INTRAMUSCULAR | Status: DC | PRN
  Administered 2024-05-31 (×2): 50 ug via INTRAVENOUS

## 2024-05-31 MED ORDER — HYDROCODONE-ACETAMINOPHEN 5-325 MG PO TABS: 1.0000 | ORAL_TABLET | Freq: Four times a day (QID) | ORAL | 0 refills | Status: AC | PRN

## 2024-05-31 MED ORDER — BUPIVACAINE-EPINEPHRINE (PF) 0.25% -1:200000 IJ SOLN
INTRAMUSCULAR | Status: AC
Start: 2024-05-31 — End: 2024-05-31
  Filled 2024-05-31: qty 30

## 2024-05-31 MED ORDER — DEXAMETHASONE SODIUM PHOSPHATE 10 MG/ML IJ SOLN
INTRAMUSCULAR | Status: DC | PRN
  Administered 2024-05-31: 10 mg via INTRAVENOUS

## 2024-05-31 MED ORDER — OXYCODONE HCL 5 MG/5ML PO SOLN: 5.0000 mg | Freq: Once | ORAL | Status: AC | PRN

## 2024-05-31 MED ORDER — ACETAMINOPHEN 10 MG/ML IV SOLN
INTRAVENOUS | Status: DC | PRN
  Administered 2024-05-31: 1000 mg via INTRAVENOUS

## 2024-05-31 MED ORDER — PHENYLEPHRINE 80 MCG/ML (10ML) SYRINGE FOR IV PUSH (FOR BLOOD PRESSURE SUPPORT)
PREFILLED_SYRINGE | INTRAVENOUS | Status: DC | PRN
  Administered 2024-05-31: 240 ug via INTRAVENOUS
  Administered 2024-05-31: 160 ug via INTRAVENOUS

## 2024-05-31 MED ORDER — LIDOCAINE HCL (CARDIAC) PF 100 MG/5ML IV SOSY
PREFILLED_SYRINGE | INTRAVENOUS | Status: DC | PRN
  Administered 2024-05-31: 100 mg via INTRAVENOUS

## 2024-05-31 MED ORDER — ACETAMINOPHEN 10 MG/ML IV SOLN: 1000.0000 mg | Freq: Once | INTRAVENOUS | Status: DC | PRN

## 2024-05-31 MED ORDER — FENTANYL CITRATE (PF) 100 MCG/2ML IJ SOLN
INTRAMUSCULAR | Status: AC
  Filled 2024-05-31: qty 2

## 2024-05-31 MED ORDER — CEFAZOLIN SODIUM-DEXTROSE 2-4 GM/100ML-% IV SOLN
INTRAVENOUS | Status: AC
  Filled 2024-05-31: qty 100

## 2024-05-31 MED ORDER — SUGAMMADEX SODIUM 200 MG/2ML IV SOLN
INTRAVENOUS | Status: DC | PRN
  Administered 2024-05-31: 200 mg via INTRAVENOUS

## 2024-05-31 MED ORDER — DEXMEDETOMIDINE HCL IN NACL 80 MCG/20ML IV SOLN
INTRAVENOUS | Status: DC | PRN
Start: 2024-05-31 — End: 2024-05-31
  Administered 2024-05-31 (×2): 4 ug via INTRAVENOUS
  Administered 2024-05-31: 8 ug via INTRAVENOUS

## 2024-05-31 MED ORDER — MIDAZOLAM HCL 2 MG/2ML IJ SOLN
INTRAMUSCULAR | Status: AC
  Filled 2024-05-31: qty 2

## 2024-05-31 MED ORDER — MIDAZOLAM HCL 2 MG/2ML IJ SOLN
INTRAMUSCULAR | Status: DC | PRN
  Administered 2024-05-31: 2 mg via INTRAVENOUS

## 2024-05-31 MED ORDER — PROPOFOL 10 MG/ML IV BOLUS
INTRAVENOUS | Status: DC | PRN
  Administered 2024-05-31: 150 ug/kg/min via INTRAVENOUS
  Administered 2024-05-31: 200 mg via INTRAVENOUS

## 2024-05-31 MED ORDER — PROPOFOL 1000 MG/100ML IV EMUL
INTRAVENOUS | Status: AC
  Filled 2024-05-31: qty 100

## 2024-05-31 MED ORDER — ONDANSETRON HCL 4 MG/2ML IJ SOLN
INTRAMUSCULAR | Status: DC | PRN
  Administered 2024-05-31: 4 mg via INTRAVENOUS

## 2024-05-31 MED ORDER — BUPIVACAINE-EPINEPHRINE 0.25% -1:200000 IJ SOLN
INTRAMUSCULAR | Status: DC | PRN
  Administered 2024-05-31: 30 mL

## 2024-05-31 MED ORDER — CHLORHEXIDINE GLUCONATE 0.12 % MT SOLN
OROMUCOSAL | Status: AC
  Filled 2024-05-31: qty 15

## 2024-05-31 MED ORDER — CEFAZOLIN SODIUM-DEXTROSE 2-4 GM/100ML-% IV SOLN
2.0000 g | INTRAVENOUS | Status: AC
  Administered 2024-05-31: 2 g via INTRAVENOUS

## 2024-05-31 MED ORDER — DROPERIDOL 2.5 MG/ML IJ SOLN: 0.6250 mg | Freq: Once | INTRAMUSCULAR | Status: DC | PRN

## 2024-05-31 MED ORDER — OXYCODONE HCL 5 MG PO TABS
5.0000 mg | ORAL_TABLET | Freq: Once | ORAL | Status: AC | PRN
  Administered 2024-05-31: 5 mg via ORAL

## 2024-05-31 MED ORDER — INDOCYANINE GREEN 25 MG IV SOLR
1.2500 mg | Freq: Once | INTRAVENOUS | Status: AC
  Administered 2024-05-31: 1.25 mg via INTRAVENOUS

## 2024-05-31 MED ORDER — OXYCODONE HCL 5 MG PO TABS
ORAL_TABLET | ORAL | Status: AC
Start: 2024-05-31 — End: 2024-05-31
  Filled 2024-05-31: qty 1

## 2024-05-31 MED ORDER — ROCURONIUM BROMIDE 100 MG/10ML IV SOLN
INTRAVENOUS | Status: DC | PRN
  Administered 2024-05-31: 60 mg via INTRAVENOUS

## 2024-05-31 SURGICAL SUPPLY — 43 items
BAG PRESSURE INF REUSE 1000 (BAG) IMPLANT
CANNULA REDUCER 12-8 DVNC XI (CANNULA) ×1 IMPLANT
CATH REDDICK CHOLANGI 4FR 50CM (CATHETERS) IMPLANT
CAUTERY HOOK MNPLR 1.6 DVNC XI (INSTRUMENTS) ×1 IMPLANT
CLIP LIGATING HEM O LOK PURPLE (MISCELLANEOUS) IMPLANT
CLIP LIGATING HEMO O LOK GREEN (MISCELLANEOUS) ×1 IMPLANT
DERMABOND ADVANCED .7 DNX12 (GAUZE/BANDAGES/DRESSINGS) ×1 IMPLANT
DRAPE ARM DVNC X/XI (DISPOSABLE) ×4 IMPLANT
DRAPE C-ARM XRAY 36X54 (DRAPES) IMPLANT
DRAPE COLUMN DVNC XI (DISPOSABLE) ×1 IMPLANT
ELECTRODE REM PT RTRN 9FT ADLT (ELECTROSURGICAL) ×1 IMPLANT
FORCEPS BPLR 8 MD DVNC XI (FORCEP) IMPLANT
FORCEPS BPLR FENES DVNC XI (FORCEP) ×1 IMPLANT
FORCEPS PROGRASP DVNC XI (FORCEP) ×1 IMPLANT
GLOVE BIO SURGEON STRL SZ 6.5 (GLOVE) ×2 IMPLANT
GLOVE BIOGEL PI IND STRL 6.5 (GLOVE) ×2 IMPLANT
GLOVE SURG SYN 6.5 ES PF (GLOVE) ×2 IMPLANT
GLOVE SURG SYN 6.5 PF PI (GLOVE) ×2 IMPLANT
GOWN STRL REUS W/ TWL LRG LVL3 (GOWN DISPOSABLE) ×4 IMPLANT
GRASPER SUT TROCAR 14GX15 (MISCELLANEOUS) ×1 IMPLANT
IRRIGATOR SUCT 8 DISP DVNC XI (IRRIGATION / IRRIGATOR) IMPLANT
IV CATH ANGIO 12GX3 LT BLUE (NEEDLE) IMPLANT
IV NS 1000ML BAXH (IV SOLUTION) IMPLANT
KIT PINK PAD W/HEAD ARM REST (MISCELLANEOUS) ×1 IMPLANT
LABEL OR SOLS (LABEL) ×1 IMPLANT
MANIFOLD NEPTUNE II (INSTRUMENTS) ×1 IMPLANT
NDL HYPO 22X1.5 SAFETY MO (MISCELLANEOUS) ×1 IMPLANT
NDL INSUFFLATION 14GA 120MM (NEEDLE) ×1 IMPLANT
NEEDLE HYPO 22X1.5 SAFETY MO (MISCELLANEOUS) ×1 IMPLANT
NEEDLE INSUFFLATION 14GA 120MM (NEEDLE) ×1 IMPLANT
NS IRRIG 500ML POUR BTL (IV SOLUTION) ×1 IMPLANT
OBTURATOR OPTICALSTD 8 DVNC (TROCAR) ×1 IMPLANT
PACK LAP CHOLECYSTECTOMY (MISCELLANEOUS) ×1 IMPLANT
SEAL UNIV 5-12 XI (MISCELLANEOUS) ×4 IMPLANT
SET TUBE SMOKE EVAC HIGH FLOW (TUBING) ×1 IMPLANT
SOLUTION ELECTROSURG ANTI STCK (MISCELLANEOUS) ×1 IMPLANT
SPIKE FLUID TRANSFER (MISCELLANEOUS) ×2 IMPLANT
SPONGE T-LAP 4X18 ~~LOC~~+RFID (SPONGE) IMPLANT
SUT VICRYL 0 UR6 27IN ABS (SUTURE) ×1 IMPLANT
SUTURE MNCRL 4-0 27XMF (SUTURE) ×1 IMPLANT
SYSTEM BAG RETRIEVAL 10MM (BASKET) ×1 IMPLANT
TRAP FLUID SMOKE EVACUATOR (MISCELLANEOUS) ×1 IMPLANT
TROCAR Z-THREAD FIOS 5X100MM (TROCAR) IMPLANT

## 2024-05-31 NOTE — Interval H&P Note (Signed)
 History and Physical Interval Note:  05/31/2024 12:51 PM  Paige Robertson  has presented today for surgery, with the diagnosis of K81.0 Acute cholecystitis.  The various methods of treatment have been discussed with the patient and family. After consideration of risks, benefits and other options for treatment, the patient has consented to  Procedure(s): CHOLECYSTECTOMY, ROBOT-ASSISTED, LAPAROSCOPIC (N/A) as a surgical intervention.  The patient's history has been reviewed, patient examined, no change in status, stable for surgery.  I have reviewed the patient's chart and labs.  Questions were answered to the patient's satisfaction.     Lucas Sjogren

## 2024-05-31 NOTE — H&P (View-Only) (Signed)
 History of Present Illness Paige Robertson is a 48 year old female who presents with biliary colic due to gallstones referred by Dr. Viviann.   She experienced her first attack of severe abdominal pain on May 29, 2024, after eating breakfast. The pain began as a bloated feeling in the stomach, followed by a sharp pain across the upper abdomen. She had another similar attack the following day. Pain localized to upper abdomen.  No pain radiation to the back.  No alleviating factors.  Aggravating factor he was eating greasy food.   An ultrasound performed on May 29, 2024, revealed 1.6 cm gallstones without wall thickening or pericholecystic fluid. The common bile duct measured 4 mm. Her bilirubin level was 0.8 mg/dL, white blood cell count was 9.6 x 10^9/L, and hemoglobin was 12.9 g/dL.   She has a history of gallbladder issues during pregnancy 13 years ago, although no stones were identified at that time. She was on Gleevec  for cancer treatment, which she stopped three years ago after surgery. During her treatment, she experienced stomach pain, which she attributed to the medication.   Currently, the pain is primarily located in the upper abdomen and does not radiate to the back. Certain foods, particularly greasy ones, exacerbate her symptoms. No current pain, fever, or infection.      PAST MEDICAL HISTORY:  Past Medical History      Past Medical History:  Diagnosis Date   Asthma attack ([facility]HS-HCC)      during childhood   Bleeding disorder () 02/18/21    tumor was found and removed.   Concussion     GI bleed     High cholesterol      2015   History of blood transfusion     History of cancer 02/18/21    surgery to remove tumor   Iron  deficiency anemia            PAST SURGICAL HISTORY:   Past Surgical History       Past Surgical History:  Procedure Laterality Date   COLONOSCOPY   02/21/2020    Internal hemorrhoids/Otherwise normal/Repeat 29yrs/TKT   EGD   02/21/2020    Normal  EGD/Negative EGD biopsy/No Repeat/TKT   EGD   11/19/2020    Gastric antral vascular ectasia.Treated/Repeat as needed/TKT   pelvic mass resection   02/18/2021   COLONOSCOPY   March 2021   UPPER GASTROINTESTINAL ENDOSCOPY   March 2021    August 2021           MEDICATIONS:  Encounter Medications        Outpatient Encounter Medications as of 05/31/2024  Medication Sig Dispense Refill   ALPRAZolam  (XANAX ) 0.25 MG tablet Take 0.25 mg by mouth nightly as needed for Sleep       MELATONIN ORAL Take by mouth OTC melatonin       cyanocobalamin  (VITAMIN B12) 1000 MCG tablet Take 1,000 mcg by mouth once daily    (Patient not taking: Reported on 05/31/2024)       estradiol  (DOTTI ) patch 0.1 mg/24 hr Place 1 patch onto the skin twice a week 8 patch 11   ferrous sulfate  325 (65 FE) MG EC tablet Take 325 mg by mouth every other day (Patient not taking: Reported on 05/31/2024)       imatinib  (GLEEVEC ) 100 MG tablet  (Patient not taking: Reported on 05/31/2024)       mv,calcium,min/iron /folic/vitK (ONE-A-DAY WOMEN'S COMPLETE ORAL) Take by mouth once daily (Patient not taking: Reported on 05/31/2024)  ondansetron  (ZOFRAN ) 8 MG tablet  (Patient not taking: Reported on 05/31/2024)       oxyCODONE -acetaminophen  (PERCOCET) 5-325 mg tablet Take 1 tablet by mouth every 6 (six) hours as needed for Pain (Patient not taking: Reported on 05/31/2024)       progesterone (PROMETRIUM) 200 MG capsule Take 1 capsule (200 mg total) by mouth once daily 30 capsule 11    No facility-administered encounter medications on file as of 05/31/2024.        ALLERGIES:   Ferumoxytol  and Sulfa (sulfonamide antibiotics)   SOCIAL HISTORY:  Social History  Social History         Socioeconomic History   Marital status: Married  Tobacco Use   Smoking status: Never   Smokeless tobacco: Never  Substance and Sexual Activity   Alcohol use: No   Drug use: No   Sexual activity: Yes      Partners: Male      Birth control/protection:  Surgical      Comment: husband had a vasectomy.  Other Topics Concern   Would you please tell us  about the people who live in your home, your pets, or anything else important to your social life? Yes      Comment: Husband, two daughters and two dogs.        FAMILY HISTORY:  Family History        Family History  Problem Relation Name Age of Onset   Stroke Father My dad     Diabetes Father My dad     Colon polyps Father My dad     Gallbladder disease Father My dad     Hyperlipidemia (Elevated cholesterol) Father My dad     Thyroid disease Mother MOM     Colon polyps Mother MOM     Celiac disease Sister Hadassah O'Ferrell     Breast cancer Paternal Grandmother my dad's mother     Breast cancer Paternal Aunt grandmother     Breast cancer Paternal Aunt aunt     Breast cancer Maternal Grandmother my mom's mother     Breast cancer Paternal Aunt my dad's sister          GENERAL REVIEW OF SYSTEMS:    General ROS: negative for - chills, fatigue, fever, weight gain or weight loss Allergy and Immunology ROS: negative for - hives  Hematological and Lymphatic ROS: negative for - bleeding problems or bruising, negative for palpable nodes Endocrine ROS: negative for - heat or cold intolerance, hair changes Respiratory ROS: negative for - cough, shortness of breath or wheezing Cardiovascular ROS: no chest pain or palpitations GI ROS: negative for nausea, vomiting, positive for abdominal pain Musculoskeletal ROS: negative for - joint swelling or muscle pain Neurological ROS: negative for - confusion, syncope Dermatological ROS: negative for pruritus and rash   PHYSICAL EXAM:     Vitals:    05/31/24 0750  BP: 99/65  Pulse: 80  .  Ht:160 cm (5' 3) Wt:59.9 kg (132 lb) ADJ:Anib surface area is 1.63 meters squared. Body mass index is 23.38 kg/m.SABRA   GENERAL: Alert, active, oriented x3   HEENT: Pupils equal reactive to light. Extraocular movements are intact. Sclera clear. Palpebral  conjunctiva normal red color.Pharynx clear.   NECK: Supple with no palpable mass and no adenopathy.   LUNGS: Sound clear with no rales rhonchi or wheezes.   HEART: Regular rhythm S1 and S2 without murmur.   ABDOMEN: Soft and depressible, nontender with no palpable mass, no hepatomegaly.  EXTREMITIES: Well-developed well-nourished symmetrical with no dependent edema.   NEUROLOGICAL: Awake alert oriented, facial expression symmetrical, moving all extremities.   Results LABS Bilirubin: 0.8 mg/dL (93/70/7974) CBC: WBC 9.6 x10^3/L, Hb 12.9 g/dL (93/70/7974)   RADIOLOGY Ultrasound: 1.6 cm gallstones, no pericholecystic fluid, common bile duct 4 mm (05/29/2024)    Assessment & Plan Acute cholecystitis Recurrent upper abdominal pain occurs postprandially, confirmed by ultrasound showing a 1.6 cm gallstone without pericholecystic fluid or significant common bile duct dilation. Pain is consistent with gallbladder colic likely due to gallstone obstruction.  Due to increased recurrence of pain patient is suffering from acute cholecystitis.  Similar symptoms occurred during pregnancy, possibly exacerbated by Gleevec  use. Surgical intervention is planned urgently today due to recurrent pain and risk of further episodes. Perform laparoscopic cholecystectomy today, with potential conversion to open surgery if excessive scar tissue is encountered. Advise on post-operative care: avoid lifting over 20 pounds, avoid sun exposure to scars, and maintain a healthy diet avoiding greasy foods. Discuss potential for same-day discharge post-surgery, barring complications. Allow use of oxycodone  for pain management if flare-up occurs before surgery. Advise on potential dietary adjustments post-surgery, emphasizing identification and avoidance of specific foods causing discomfort.    Cholecystitis, acute [K81.0]           Patient and her husband verbalized understanding, all questions were answered, and were  agreeable with the plan outlined above.    Lucas Sjogren, MD   Electronically signed by Lucas Sjogren, MD

## 2024-05-31 NOTE — Anesthesia Procedure Notes (Signed)
 Procedure Name: Intubation Date/Time: 05/31/2024 12:59 PM  Performed by: Lorrene Camelia LABOR, CRNAPre-anesthesia Checklist: Patient identified, Patient being monitored, Timeout performed, Emergency Drugs available and Suction available Patient Re-evaluated:Patient Re-evaluated prior to induction Oxygen Delivery Method: Circle system utilized Preoxygenation: Pre-oxygenation with 100% oxygen Induction Type: IV induction Ventilation: Mask ventilation without difficulty Laryngoscope Size: 3 and McGrath Grade View: Grade I Tube type: Oral Tube size: 7.0 mm Number of attempts: 1 Airway Equipment and Method: Stylet Placement Confirmation: ETT inserted through vocal cords under direct vision, positive ETCO2 and breath sounds checked- equal and bilateral Secured at: 21 cm Tube secured with: Tape Dental Injury: Teeth and Oropharynx as per pre-operative assessment

## 2024-05-31 NOTE — Op Note (Signed)
 Preoperative diagnosis: Acute cholecystitis  Postoperative diagnosis: Acute cholecystitis  Procedure: Robotic Assisted Laparoscopic Cholecystectomy.   Anesthesia: GETA   Surgeon: Dr. Cesar Coe  Wound Classification: Clean Contaminated  Indications: Patient is a 48 y.o. female developed right upper quadrant pain and on workup was found to have cholelithiasis with a normal common duct with cholecystitis. Robotic Assisted Laparoscopic cholecystectomy was elected.  Findings: Pericholecystic edema and gallbladder distention.  Critical view of safety achieved Cystic duct and artery identified, ligated and divided Adequate hemostasis         Description of procedure: The patient was placed on the operating table in the supine position. General anesthesia was induced. A time-out was completed verifying correct patient, procedure, site, positioning, and implant(s) and/or special equipment prior to beginning this procedure. An orogastric tube was placed. The abdomen was prepped and draped in the usual sterile fashion.  An incision was made in a natural skin line below the umbilicus.  The fascia was elevated and the Veress needle inserted. Proper position was confirmed by aspiration and saline meniscus test.  The abdomen was insufflated with carbon dioxide to a pressure of 15 mmHg. The patient tolerated insufflation well. A 8-mm trocar was then inserted in optiview fashion.  The laparoscope was inserted and the abdomen inspected. No injuries from initial trocar placement were noted. Additional trocars were then inserted in the following locations: an 8-mm trocar in the left lateral abdomen, and another two 8-mm trocars to the right side of the abdomen 5 cm appart. The umbilical trocar was changed to a 12 mm trocar all under direct visualization. The abdomen was inspected and no abnormalities were found. The table was placed in the reverse Trendelenburg position with the right side up. The  robotic arms were docked and target anatomy identified. Instrument inserted under direct visualization.  Filmy adhesions between the gallbladder and omentum, duodenum and transverse colon were lysed with electrocautery. The dome of the gallbladder was grasped with a prograsp and retracted over the dome of the liver. The infundibulum was also grasped with an atraumatic grasper and retracted toward the right lower quadrant. This maneuver exposed Calot's triangle. The peritoneum overlying the gallbladder infundibulum was then incised and the cystic duct and cystic artery identified and circumferentially dissected. Critical view of safety reviewed before ligating any structure. Firefly images taken to visualize biliary ducts. The cystic duct and cystic artery were then doubly clipped and divided close to the gallbladder.  The gallbladder was then dissected from its peritoneal attachments by electrocautery. Hemostasis was checked and the gallbladder and contained stones were removed using an endoscopic retrieval bag. The gallbladder was passed off the table as a specimen.  There was no evidence of bleeding from the gallbladder fossa or cystic artery or leakage of the bile from the cystic duct stump. Secondary trocars were removed under direct vision. No bleeding was noted. The robotic arms were undoked. The scope was withdrawn and the umbilical trocar removed. The abdomen was allowed to collapse. The fascia of the 12mm trocar sites was closed with figure-of-eight 0 vicryl sutures. The skin was closed with subcuticular sutures of 4-0 monocryl and topical skin adhesive. The orogastric tube was removed.  The patient tolerated the procedure well and was taken to the postanesthesia care unit in stable condition.   Specimen: Gallbladder  Complications: None  EBL: 5 mL

## 2024-05-31 NOTE — Discharge Instructions (Signed)

## 2024-05-31 NOTE — Anesthesia Preprocedure Evaluation (Addendum)
 Anesthesia Evaluation  Patient identified by MRN, date of birth, ID band Patient awake    Reviewed: Allergy & Precautions, H&P , NPO status , Patient's Chart, lab work & pertinent test results  Airway Mallampati: II  TM Distance: >3 FB Neck ROM: full    Dental no notable dental hx.    Pulmonary neg pulmonary ROS   Pulmonary exam normal        Cardiovascular negative cardio ROS Normal cardiovascular exam     Neuro/Psych negative neurological ROS  negative psych ROS   GI/Hepatic Neg liver ROS,GERD  ,,  Endo/Other  negative endocrine ROS    Renal/GU      Musculoskeletal   Abdominal   Peds  Hematology  (+) Blood dyscrasia, anemia   Anesthesia Other Findings Past Medical History: No date: Acid reflux No date: Hematuria No date: Hyperlipemia No date: IDA (iron  deficiency anemia) No date: Melena No date: Perimenopausal symptoms No date: Vaginal discharge  Past Surgical History: 02/18/2021: BOWEL RESECTION; N/A     Comment:  Procedure: SMALL BOWEL RESECTION;  Surgeon: Sebastian Moles, MD;  Location: [facility] OR;  Service: General;                Laterality: N/A; No date: COLONOSCOPY WITH ESOPHAGOGASTRODUODENOSCOPY (EGD) 07/09/2020: ENTEROSCOPY; N/A     Comment:  Procedure: ENTEROSCOPY;  Surgeon: Unk Corinn Skiff,               MD;  Location: [facility] ENDOSCOPY;  Service:               Gastroenterology;  Laterality: N/A; 02/07/2021: ENTEROSCOPY; N/A     Comment:  Procedure: ENTEROSCOPY;  Surgeon: Unk Corinn Skiff,               MD;  Location: [facility] ENDOSCOPY;  Service:               Gastroenterology;  Laterality: N/A; 02/13/2021: ENTEROSCOPY; N/A     Comment:  Procedure: ENTEROSCOPY;  Surgeon: Rollin Dover, MD;                Location: [facility] ENDOSCOPY;  Service: Endoscopy;  Laterality:              N/A; 07/06/2020: ESOPHAGOGASTRODUODENOSCOPY (EGD) WITH PROPOFOL ; N/A     Comment:  Procedure:  ESOPHAGOGASTRODUODENOSCOPY (EGD) WITH               PROPOFOL ;  Surgeon: Jinny Carmine, MD;  Location: [facility]               ENDOSCOPY;  Service: Endoscopy;  Laterality: N/A; 11/19/2020: ESOPHAGOGASTRODUODENOSCOPY (EGD) WITH PROPOFOL ; N/A     Comment:  Procedure: ESOPHAGOGASTRODUODENOSCOPY (EGD) WITH               PROPOFOL ;  Surgeon: Toledo, Ladell POUR, MD;  Location:               [facility] ENDOSCOPY;  Service: Gastroenterology;  Laterality:               N/A; 07/08/2020: GIVENS CAPSULE STUDY; N/A     Comment:  Procedure: GIVENS CAPSULE STUDY;  Surgeon: Janalyn Keene NOVAK, MD;  Location: [facility] ENDOSCOPY;  Service:               Endoscopy;  Laterality: N/A;  Administer between 6-8 pm  today 02/13/2021: HEMOSTASIS CLIP PLACEMENT     Comment:  Procedure: HEMOSTASIS CLIP PLACEMENT;  Surgeon: Rollin Dover, MD;  Location: [facility] ENDOSCOPY;  Service:               Endoscopy;; 02/18/2021: LAPAROTOMY; N/A     Comment:  Procedure: RESECTION PELVIC MASS;  Surgeon: Sebastian Moles, MD;  Location: [facility] OR;  Service: General;                Laterality: N/A; No date: NO PAST SURGERIES 02/13/2021: MATIAS     Comment:  Procedure: SCLEROTHERAPY;  Surgeon: Rollin Dover, MD;                Location: [facility] ENDOSCOPY;  Service: Endoscopy;;     Reproductive/Obstetrics negative OB ROS                             Anesthesia Physical Anesthesia Plan  ASA: 2  Anesthesia Plan: General ETT   Post-op Pain Management: Ofirmev  IV (intra-op)* and Toradol IV (intra-op)*   Induction: Intravenous  PONV Risk Score and Plan: 2 and Ondansetron , Dexamethasone  and Midazolam   Airway Management Planned: Oral ETT  Additional Equipment:   Intra-op Plan:   Post-operative Plan: Extubation in OR  Informed Consent: I have reviewed the patients History and Physical, chart, labs and discussed the procedure including the risks, benefits and  alternatives for the proposed anesthesia with the patient or authorized representative who has indicated his/her understanding and acceptance.     Dental Advisory Given  Plan Discussed with: CRNA and Surgeon  Anesthesia Plan Comments:         Anesthesia Quick Evaluation

## 2024-05-31 NOTE — Transfer of Care (Signed)
 Immediate Anesthesia Transfer of Care Note  Patient: Paige Robertson  Procedure(s) Performed: CHOLECYSTECTOMY, ROBOT-ASSISTED, LAPAROSCOPIC (Abdomen)  Patient Location: PACU  Anesthesia Type:General  Level of Consciousness: drowsy and patient cooperative  Airway & Oxygen Therapy: Patient Spontanous Breathing and Patient connected to nasal cannula oxygen  Post-op Assessment: Report given to RN and Post -op Vital signs reviewed and stable  Post vital signs: Reviewed and stable  Last Vitals:  Vitals Value Taken Time  BP 147/69 05/31/24 14:26  Temp 36.4 C 05/31/24 14:27  Pulse 66 05/31/24 14:27  Resp 17 05/31/24 14:27  SpO2 100 % 05/31/24 14:27  Vitals shown include unfiled device data.  Last Pain:  Vitals:   05/31/24 1427  TempSrc:   PainSc: 4          Complications: No notable events documented.

## 2024-05-31 NOTE — H&P (Signed)
 History of Present Illness Paige Robertson is a 48 year old female who presents with biliary colic due to gallstones referred by Dr. Viviann.   She experienced her first attack of severe abdominal pain on May 29, 2024, after eating breakfast. The pain began as a bloated feeling in the stomach, followed by a sharp pain across the upper abdomen. She had another similar attack the following day. Pain localized to upper abdomen.  No pain radiation to the back.  No alleviating factors.  Aggravating factor he was eating greasy food.   An ultrasound performed on May 29, 2024, revealed 1.6 cm gallstones without wall thickening or pericholecystic fluid. The common bile duct measured 4 mm. Her bilirubin level was 0.8 mg/dL, white blood cell count was 9.6 x 10^9/L, and hemoglobin was 12.9 g/dL.   She has a history of gallbladder issues during pregnancy 13 years ago, although no stones were identified at that time. She was on Gleevec  for cancer treatment, which she stopped three years ago after surgery. During her treatment, she experienced stomach pain, which she attributed to the medication.   Currently, the pain is primarily located in the upper abdomen and does not radiate to the back. Certain foods, particularly greasy ones, exacerbate her symptoms. No current pain, fever, or infection.      PAST MEDICAL HISTORY:  Past Medical History      Past Medical History:  Diagnosis Date   Asthma attack ([facility])      during childhood   Bleeding disorder () 02/18/21    tumor was found and removed.   Concussion     GI bleed     High cholesterol      2015   History of blood transfusion     History of cancer 02/18/21    surgery to remove tumor   Iron  deficiency anemia            PAST SURGICAL HISTORY:   Past Surgical History       Past Surgical History:  Procedure Laterality Date   COLONOSCOPY   02/21/2020    Internal hemorrhoids/Otherwise normal/Repeat 29yrs/TKT   EGD   02/21/2020    Normal  EGD/Negative EGD biopsy/No Repeat/TKT   EGD   11/19/2020    Gastric antral vascular ectasia.Treated/Repeat as needed/TKT   pelvic mass resection   02/18/2021   COLONOSCOPY   March 2021   UPPER GASTROINTESTINAL ENDOSCOPY   March 2021    August 2021           MEDICATIONS:  Encounter Medications        Outpatient Encounter Medications as of 05/31/2024  Medication Sig Dispense Refill   ALPRAZolam  (XANAX ) 0.25 MG tablet Take 0.25 mg by mouth nightly as needed for Sleep       MELATONIN ORAL Take by mouth OTC melatonin       cyanocobalamin  (VITAMIN B12) 1000 MCG tablet Take 1,000 mcg by mouth once daily    (Patient not taking: Reported on 05/31/2024)       estradiol  (DOTTI ) patch 0.1 mg/24 hr Place 1 patch onto the skin twice a week 8 patch 11   ferrous sulfate  325 (65 FE) MG EC tablet Take 325 mg by mouth every other day (Patient not taking: Reported on 05/31/2024)       imatinib  (GLEEVEC ) 100 MG tablet  (Patient not taking: Reported on 05/31/2024)       mv,calcium,min/iron /folic/vitK (ONE-A-DAY WOMEN'S COMPLETE ORAL) Take by mouth once daily (Patient not taking: Reported on 05/31/2024)  ondansetron  (ZOFRAN ) 8 MG tablet  (Patient not taking: Reported on 05/31/2024)       oxyCODONE -acetaminophen  (PERCOCET) 5-325 mg tablet Take 1 tablet by mouth every 6 (six) hours as needed for Pain (Patient not taking: Reported on 05/31/2024)       progesterone (PROMETRIUM) 200 MG capsule Take 1 capsule (200 mg total) by mouth once daily 30 capsule 11    No facility-administered encounter medications on file as of 05/31/2024.        ALLERGIES:   Ferumoxytol  and Sulfa (sulfonamide antibiotics)   SOCIAL HISTORY:  Social History  Social History         Socioeconomic History   Marital status: Married  Tobacco Use   Smoking status: Never   Smokeless tobacco: Never  Substance and Sexual Activity   Alcohol use: No   Drug use: No   Sexual activity: Yes      Partners: Male      Birth control/protection:  Surgical      Comment: husband had a vasectomy.  Other Topics Concern   Would you please tell us  about the people who live in your home, your pets, or anything else important to your social life? Yes      Comment: Husband, two daughters and two dogs.        FAMILY HISTORY:  Family History        Family History  Problem Relation Name Age of Onset   Stroke Father My dad     Diabetes Father My dad     Colon polyps Father My dad     Gallbladder disease Father My dad     Hyperlipidemia (Elevated cholesterol) Father My dad     Thyroid disease Mother MOM     Colon polyps Mother MOM     Celiac disease Sister Hadassah O'Ferrell     Breast cancer Paternal Grandmother my dad's mother     Breast cancer Paternal Aunt grandmother     Breast cancer Paternal Aunt aunt     Breast cancer Maternal Grandmother my mom's mother     Breast cancer Paternal Aunt my dad's sister          GENERAL REVIEW OF SYSTEMS:    General ROS: negative for - chills, fatigue, fever, weight gain or weight loss Allergy and Immunology ROS: negative for - hives  Hematological and Lymphatic ROS: negative for - bleeding problems or bruising, negative for palpable nodes Endocrine ROS: negative for - heat or cold intolerance, hair changes Respiratory ROS: negative for - cough, shortness of breath or wheezing Cardiovascular ROS: no chest pain or palpitations GI ROS: negative for nausea, vomiting, positive for abdominal pain Musculoskeletal ROS: negative for - joint swelling or muscle pain Neurological ROS: negative for - confusion, syncope Dermatological ROS: negative for pruritus and rash   PHYSICAL EXAM:     Vitals:    05/31/24 0750  BP: 99/65  Pulse: 80  .  Ht:160 cm (5' 3) Wt:59.9 kg (132 lb) ADJ:Anib surface area is 1.63 meters squared. Body mass index is 23.38 kg/m.SABRA   GENERAL: Alert, active, oriented x3   HEENT: Pupils equal reactive to light. Extraocular movements are intact. Sclera clear. Palpebral  conjunctiva normal red color.Pharynx clear.   NECK: Supple with no palpable mass and no adenopathy.   LUNGS: Sound clear with no rales rhonchi or wheezes.   HEART: Regular rhythm S1 and S2 without murmur.   ABDOMEN: Soft and depressible, nontender with no palpable mass, no hepatomegaly.  EXTREMITIES: Well-developed well-nourished symmetrical with no dependent edema.   NEUROLOGICAL: Awake alert oriented, facial expression symmetrical, moving all extremities.   Results LABS Bilirubin: 0.8 mg/dL (93/70/7974) CBC: WBC 9.6 x10^3/L, Hb 12.9 g/dL (93/70/7974)   RADIOLOGY Ultrasound: 1.6 cm gallstones, no pericholecystic fluid, common bile duct 4 mm (05/29/2024)    Assessment & Plan Acute cholecystitis Recurrent upper abdominal pain occurs postprandially, confirmed by ultrasound showing a 1.6 cm gallstone without pericholecystic fluid or significant common bile duct dilation. Pain is consistent with gallbladder colic likely due to gallstone obstruction.  Due to increased recurrence of pain patient is suffering from acute cholecystitis.  Similar symptoms occurred during pregnancy, possibly exacerbated by Gleevec  use. Surgical intervention is planned urgently today due to recurrent pain and risk of further episodes. Perform laparoscopic cholecystectomy today, with potential conversion to open surgery if excessive scar tissue is encountered. Advise on post-operative care: avoid lifting over 20 pounds, avoid sun exposure to scars, and maintain a healthy diet avoiding greasy foods. Discuss potential for same-day discharge post-surgery, barring complications. Allow use of oxycodone  for pain management if flare-up occurs before surgery. Advise on potential dietary adjustments post-surgery, emphasizing identification and avoidance of specific foods causing discomfort.    Cholecystitis, acute [K81.0]           Patient and her husband verbalized understanding, all questions were answered, and were  agreeable with the plan outlined above.    Lucas Sjogren, MD   Electronically signed by Lucas Sjogren, MD

## 2024-06-01 NOTE — Anesthesia Postprocedure Evaluation (Signed)
 Anesthesia Post Note  Patient: Paige Robertson  Procedure(s) Performed: CHOLECYSTECTOMY, ROBOT-ASSISTED, LAPAROSCOPIC (Abdomen)  Patient location during evaluation: PACU Anesthesia Type: General Level of consciousness: awake and alert Pain management: pain level controlled Vital Signs Assessment: post-procedure vital signs reviewed and stable Respiratory status: spontaneous breathing, nonlabored ventilation and respiratory function stable Cardiovascular status: blood pressure returned to baseline and stable Postop Assessment: no apparent nausea or vomiting Anesthetic complications: no   No notable events documented.   Last Vitals:  Vitals:   05/31/24 1500 05/31/24 1515  BP: 118/75 109/67  Pulse: 76 69  Resp: 17 11  Temp:  36.4 C  SpO2: 100% 100%    Last Pain:  Vitals:   06/01/24 0912  TempSrc:   PainSc: 0-No pain                 Camellia Merilee Louder

## 2024-06-02 LAB — SURGICAL PATHOLOGY

## 2024-06-13 ENCOUNTER — Encounter: Payer: Self-pay | Admitting: Oncology

## 2024-06-15 ENCOUNTER — Inpatient Hospital Stay: Admitting: Oncology

## 2024-06-24 ENCOUNTER — Encounter: Payer: Self-pay | Admitting: Oncology

## 2024-06-28 ENCOUNTER — Encounter: Payer: Self-pay | Admitting: *Deleted

## 2024-07-01 ENCOUNTER — Encounter: Payer: Self-pay | Admitting: Oncology

## 2024-07-01 ENCOUNTER — Inpatient Hospital Stay: Attending: Oncology | Admitting: Oncology

## 2024-07-01 VITALS — BP 92/68 | HR 76 | Temp 97.7°F | Resp 18 | Ht 61.0 in | Wt 128.2 lb

## 2024-07-01 DIAGNOSIS — C49A3 Gastrointestinal stromal tumor of small intestine: Secondary | ICD-10-CM | POA: Insufficient documentation

## 2024-07-01 DIAGNOSIS — Z79899 Other long term (current) drug therapy: Secondary | ICD-10-CM | POA: Diagnosis not present

## 2024-07-01 NOTE — Progress Notes (Signed)
 [facility] OFFICE PROGRESS NOTE   Diagnosis: Gastrointestinal stromal tumor  INTERVAL HISTORY:   Paige Robertson returns as scheduled.  She remains off of imatinib .  She developed acute abdominal pain 05/29/2024 and was seen in the emergency room.  She was found to have a gallstone on ultrasound.  The pain returned the following day.  She was seen by general surgery and underwent a cholecystectomy on 05/31/2024.  The pathology revealed chronic cholecystitis with cholelithiasis.  She reports the pain resolved following surgery.  She has discomfort and tenderness at the left upper abdomen surgical site.  No fever, erythema, or drainage.  She is having 2 bowel movements per day following the cholecystectomy procedure.  Objective:  Vital signs in last 24 hours:  Blood pressure 92/68, pulse 76, temperature 97.7 F (36.5 C), temperature source Temporal, resp. rate 18, height 5' 1 (1.549 m), weight 128 lb 3.2 oz (58.2 kg), SpO2 100%.    Resp: Distant breath sounds, lungs are clear Cardio: Regular rate and rhythm GI: Soft, no mass, no hepatosplenomegaly, healed surgical incisions, tenderness surrounding the medial left upper abdomen incision site.  No erythema or fluctuance Vascular: No leg edema   Lab Results:  Lab Results  Component Value Date   WBC 9.6 05/29/2024   HGB 12.9 05/29/2024   HCT 38.4 05/29/2024   MCV 94.3 05/29/2024   PLT 198 05/29/2024   NEUTROABS 7.4 05/29/2024    CMP  Lab Results  Component Value Date   NA 137 05/29/2024   K 3.5 05/29/2024   CL 103 05/29/2024   CO2 24 05/29/2024   GLUCOSE 107 (H) 05/29/2024   BUN 13 05/29/2024   CREATININE 0.60 05/29/2024   CALCIUM 10.4 (H) 05/29/2024   PROT 7.6 05/29/2024   ALBUMIN 4.3 05/29/2024   AST 25 05/29/2024   ALT 21 05/29/2024   ALKPHOS 83 05/29/2024   BILITOT 0.8 05/29/2024   GFRNONAA >60 05/29/2024   GFRAA >60 07/09/2020    Lab Results  Component Value Date   CEA1 0.7 02/18/2021    Lab  Results  Component Value Date   INR 1.0 02/07/2021   LABPROT 13.1 02/07/2021    Imaging:  No results found.  Medications: I have reviewed the patient's current medications.   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- 10.9 cm tumor, 1 mitosis per 5 mm, Ki67-less than 1%, CD117 positive, pT4pNx Gleevec  starting 03/28/2021 Gleevec  held beginning 05/01/2021 secondary to neutropenia Gleevec  resumed 200 mg daily 05/06/2021 Gleevec  escalated to 300 mg daily 06/20/2021 Gleevec  escalated to 300 mg daily alternating with 400 mg daily 08/29/2021 Gleevec  placed on hold 12/05/2021 secondary to nausea and anorexia, resumed at a dose of 300 mg daily 12/12/2021 Gleevec  dose reduced to 200 mg daily 12/19/2021 secondary to nausea Gleevec  200 mg daily continued 01/10/2022 Gleevec  discontinued 03/11/2024 04/19/2024: CT abdomen/pelvis-no evidence of recurrence   History of anemia and iron  deficiency secondary to bleeding from #1 Multiple hospital admissions with symptomatic anemia 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  She will return for an office visit in 6 months.  She will call for new symptoms.  She will follow-up with surgery if the tenderness/pain at the left abdominal surgical site does not  improve.  Arley Hof, MD  07/01/2024  9:21 AM

## 2024-09-20 ENCOUNTER — Other Ambulatory Visit: Payer: Self-pay

## 2024-09-20 DIAGNOSIS — Z0000 Encounter for general adult medical examination without abnormal findings: Secondary | ICD-10-CM

## 2024-09-27 ENCOUNTER — Other Ambulatory Visit: Payer: Self-pay

## 2024-09-27 DIAGNOSIS — Z0000 Encounter for general adult medical examination without abnormal findings: Secondary | ICD-10-CM

## 2024-09-28 LAB — COMPREHENSIVE METABOLIC PANEL WITH GFR
ALT: 27 IU/L (ref 0–32)
AST: 29 IU/L (ref 0–40)
Albumin: 4.4 g/dL (ref 3.9–4.9)
Alkaline Phosphatase: 104 IU/L (ref 41–116)
BUN/Creatinine Ratio: 25 — ABNORMAL HIGH (ref 9–23)
BUN: 15 mg/dL (ref 6–24)
Bilirubin Total: 0.3 mg/dL (ref 0.0–1.2)
CO2: 25 mmol/L (ref 20–29)
Calcium: 10.2 mg/dL (ref 8.7–10.2)
Chloride: 104 mmol/L (ref 96–106)
Creatinine, Ser: 0.6 mg/dL (ref 0.57–1.00)
Globulin, Total: 2.2 g/dL (ref 1.5–4.5)
Glucose: 80 mg/dL (ref 70–99)
Potassium: 4.6 mmol/L (ref 3.5–5.2)
Sodium: 143 mmol/L (ref 134–144)
Total Protein: 6.6 g/dL (ref 6.0–8.5)
eGFR: 111 mL/min/1.73 (ref >59)

## 2024-09-28 LAB — CBC WITH DIFFERENTIAL/PLATELET
Basophils Absolute: 0 x10E3/uL (ref 0.0–0.2)
Basos: 1 %
EOS (ABSOLUTE): 0.1 x10E3/uL (ref 0.0–0.4)
Eos: 2 %
Hematocrit: 36.7 % (ref 34.0–46.6)
Hemoglobin: 11.7 g/dL (ref 11.1–15.9)
Immature Grans (Abs): 0 x10E3/uL (ref 0.0–0.1)
Immature Granulocytes: 0 %
Lymphocytes Absolute: 1.5 x10E3/uL (ref 0.7–3.1)
Lymphs: 37 %
MCH: 31 pg (ref 26.6–33.0)
MCHC: 31.9 g/dL (ref 31.5–35.7)
MCV: 97 fL (ref 79–97)
Monocytes Absolute: 0.3 x10E3/uL (ref 0.1–0.9)
Monocytes: 6 %
Neutrophils Absolute: 2.2 x10E3/uL (ref 1.4–7.0)
Neutrophils: 54 %
Platelets: 198 x10E3/uL (ref 150–450)
RBC: 3.78 x10E6/uL (ref 3.77–5.28)
RDW: 12.6 % (ref 11.7–15.4)
WBC: 4.1 x10E3/uL (ref 3.4–10.8)

## 2024-09-28 LAB — LIPID PANEL
Chol/HDL Ratio: 3.1 ratio (ref 0.0–4.4)
Cholesterol, Total: 220 mg/dL — ABNORMAL HIGH (ref 100–199)
HDL: 72 mg/dL (ref >39)
LDL Chol Calc (NIH): 127 mg/dL — ABNORMAL HIGH (ref 0–99)
Triglycerides: 118 mg/dL (ref 0–149)
VLDL Cholesterol Cal: 21 mg/dL (ref 5–40)

## 2024-09-28 LAB — TSH: TSH: 2.63 u[IU]/mL (ref 0.450–4.500)

## 2024-09-29 ENCOUNTER — Encounter: Payer: Self-pay | Admitting: Oncology

## 2024-10-06 ENCOUNTER — Encounter: Payer: Self-pay | Admitting: Adult Health

## 2024-10-06 ENCOUNTER — Other Ambulatory Visit: Payer: Self-pay

## 2024-10-06 ENCOUNTER — Ambulatory Visit: Payer: Self-pay | Admitting: Adult Health

## 2024-10-06 VITALS — BP 100/68 | HR 66 | Temp 99.0°F | Ht 61.0 in | Wt 128.0 lb

## 2024-10-06 DIAGNOSIS — J019 Acute sinusitis, unspecified: Secondary | ICD-10-CM

## 2024-10-06 MED ORDER — AMOXICILLIN-POT CLAVULANATE 875-125 MG PO TABS: 1.0000 | ORAL_TABLET | Freq: Two times a day (BID) | ORAL | 0 refills | Status: DC

## 2024-10-06 NOTE — Progress Notes (Signed)
 Therapist, [facility] [address] [place], [place] [address]   Office Visit Note  Patient Name: Paige Robertson [id number]  Medical Record number [id number]  Date of Service: 10/06/2024  Chief Complaint  Patient presents with   Acute Visit    Patient c/o postnasal drainage, bilat ear pain, and pressure between her eyes. Symptoms have been ongoing for about 2 weeks. She has been taking Tylenol  Cold medicine but symptoms have not improved.      HPI Pt is here for a sick visit.She reports sinus pressure, headache, ear pressure, PND for about a week.  Denies fever, chills, cough.  She has been taking tylenol  cold, with minor relief of her headaches.     Current Medication:  Outpatient Encounter Medications as of 10/06/2024  Medication Sig   acetaminophen  (TYLENOL ) 325 MG tablet Take 2 tablets (650 mg total) by mouth every 6 (six) hours as needed for mild pain, moderate pain, fever or headache.   amoxicillin -clavulanate (AUGMENTIN ) 875-125 MG tablet Take 1 tablet by mouth 2 (two) times daily.   [DISCONTINUED] estradiol  (VIVELLE -DOT) 0.1 MG/24HR patch Place 1 patch onto the skin 2 (two) times a week. (Patient not taking: Reported on 07/01/2024)   [DISCONTINUED] imatinib  (GLEEVEC ) 100 MG tablet Take 2 tablets (200 mg total) by mouth daily. (Patient not taking: Reported on 07/01/2024)   [DISCONTINUED] naproxen  (NAPROSYN ) 500 MG tablet Take 1 tablet (500 mg total) by mouth 2 (two) times daily as needed. (Patient not taking: Reported on 07/01/2024)   [DISCONTINUED] ondansetron  (ZOFRAN -ODT) 4 MG disintegrating tablet Take 1 tablet (4 mg total) by mouth every 8 (eight) hours as needed for nausea or vomiting. (Patient not taking: Reported on 07/01/2024)   [DISCONTINUED] oxyCODONE -acetaminophen  (PERCOCET/ROXICET) 5-325 MG tablet Take 1 tablet by mouth every 4 (four) hours as needed for severe pain (pain score 7-10). (Patient not taking: Reported on 07/01/2024)   [DISCONTINUED] progesterone  (PROMETRIUM) 200 MG capsule Take 200 mg by mouth daily. (Patient not taking: Reported on 07/01/2024)   [DISCONTINUED] traMADol  (ULTRAM ) 50 MG tablet Take 1 tablet (50 mg total) by mouth every 6 (six) hours as needed. (Patient not taking: Reported on 07/01/2024)   No facility-administered encounter medications on file as of 10/06/2024.    Medical History: Past Medical History:  Diagnosis Date   Acid reflux    Hematuria    Hyperlipemia    IDA (iron  deficiency anemia)    Melena    Perimenopausal symptoms    Vaginal discharge      Vital Signs: BP 100/68   Pulse 66   Temp 99 F (37.2 C)   Ht 5' 1 (1.549 m)   Wt 128 lb (58.1 kg)   SpO2 99%   BMI 24.19 kg/m    Review of Systems  Constitutional:  Negative for chills and fever.  HENT:  Positive for congestion, ear pain and postnasal drip.   Eyes:  Negative for pain and itching.  Respiratory:  Negative for cough.   Cardiovascular:  Negative for chest pain.  Gastrointestinal:  Negative for diarrhea, nausea and vomiting.  Neurological:  Positive for headaches.    Physical Exam Vitals reviewed.  Constitutional:      General: She is not in acute distress.    Appearance: She is normal weight. She is not ill-appearing.  HENT:     Head: Normocephalic.     Right Ear: Tympanic membrane and ear canal normal.     Left Ear: Tympanic membrane and ear canal normal.  Nose:     Right Sinus: Maxillary sinus tenderness and frontal sinus tenderness present.     Left Sinus: Maxillary sinus tenderness and frontal sinus tenderness present.     Mouth/Throat:     Mouth: Mucous membranes are moist.  Eyes:     General:        Right eye: No discharge.        Left eye: No discharge.     Pupils: Pupils are equal, round, and reactive to light.  Cardiovascular:     Rate and Rhythm: Normal rate and regular rhythm.     Pulses: Normal pulses.     Heart sounds: No murmur heard.    No friction rub.  Pulmonary:     Effort: Pulmonary effort is  normal. No respiratory distress.     Breath sounds: Normal breath sounds. No wheezing.  Musculoskeletal:     Cervical back: Normal range of motion. No rigidity.  Lymphadenopathy:     Cervical: No cervical adenopathy.  Neurological:     General: No focal deficit present.     Mental Status: She is alert and oriented to person, place, and time.  Psychiatric:        Mood and Affect: Mood normal.        Behavior: Behavior normal.    Assessment/Plan: 1. Acute non-recurrent sinusitis, unspecified location (Primary) Patient Instructions: -Take complete course of antibiotics as prescribed.  Take with food.  -Try Flonase/Fluticasone nasal spray, 2 sprays to each nostril once a day. -You can try using a neti pot or nasal saline rinse product to help clear mucus congestion. -Rest and stay well hydrated (by drinking water and other liquids). Avoid/limit caffeine. -Take over-the-counter medicines (i.e. Mucinex, decongestant, Ibuprofen or Tylenol , cough suppressant) to help relieve your symptoms. -For your cough, use cough drops/throat lozenges, gargle warm salt water and/or drink warm liquids (like tea with honey). -Send my chart message to provider or schedule return visit as needed for new/worsening symptoms or if symptoms do not improve as discussed with antibiotic and other recommended treatment.   - amoxicillin -clavulanate (AUGMENTIN ) 875-125 MG tablet; Take 1 tablet by mouth 2 (two) times daily.  Dispense: 20 tablet; Refill: 0     General Counseling: Paige Robertson understanding of the findings of todays visit and agrees with plan of treatment. I have discussed any further diagnostic evaluation that may be needed or ordered today. We also reviewed her medications today. she has been encouraged to call the office with any questions or concerns that should arise related to todays visit.   No orders of the defined types were placed in this encounter.   Meds ordered this encounter   Medications   amoxicillin -clavulanate (AUGMENTIN ) 875-125 MG tablet    Sig: Take 1 tablet by mouth 2 (two) times daily.    Dispense:  20 tablet    Refill:  0    Time spent:15 Minutes    Paige Robertson AGNP-C Nurse Practitioner

## 2024-11-09 ENCOUNTER — Other Ambulatory Visit: Payer: Self-pay | Admitting: Obstetrics and Gynecology

## 2024-11-09 DIAGNOSIS — Z1231 Encounter for screening mammogram for malignant neoplasm of breast: Secondary | ICD-10-CM

## 2024-12-12 ENCOUNTER — Encounter: Payer: Self-pay | Admitting: Medical

## 2024-12-12 ENCOUNTER — Other Ambulatory Visit: Payer: Self-pay

## 2024-12-12 ENCOUNTER — Ambulatory Visit: Payer: Self-pay | Admitting: Medical

## 2024-12-12 VITALS — BP 110/70 | HR 94 | Temp 99.2°F | Ht 61.0 in | Wt 128.0 lb

## 2024-12-12 DIAGNOSIS — H1032 Unspecified acute conjunctivitis, left eye: Secondary | ICD-10-CM

## 2024-12-12 DIAGNOSIS — J069 Acute upper respiratory infection, unspecified: Secondary | ICD-10-CM

## 2024-12-12 DIAGNOSIS — H60503 Unspecified acute noninfective otitis externa, bilateral: Secondary | ICD-10-CM

## 2024-12-12 MED ORDER — OFLOXACIN 0.3 % OP SOLN: 1.0000 [drp] | Freq: Four times a day (QID) | OPHTHALMIC | 0 refills | Status: AC

## 2024-12-12 MED ORDER — OFLOXACIN 0.3 % OT SOLN: 10.0000 [drp] | Freq: Every day | OTIC | 0 refills | Status: AC

## 2024-12-12 NOTE — Patient Instructions (Addendum)
-  Use antibiotic eye drops as prescribed. -Apply cool compress (cold washcloth) over eyes for 10-20 minutes as needed for pain/swelling/discomfort. -Avoid touching your eyes. When you must touch them, wash your hands or use hand sanitizer afterwards. -Do not wear eye makeup until your symptoms fully resolve. Discard mascara or eye makeup worn recently. -If you wear contact lenses, discard the pair you wore most recently and do not wear them again until your symptoms have resolved and you have completed your antibiotics. -Do not share towels/washcloths with others.  -Change/wash pillowcases after few days on antibiotic drops and if visibly dirty.  -Instill 10 drops into each ear once daily. Lay on your side for several minutes after instilling drops. -Stop using hydrogen peroxide in your ears. -Avoid using Q-tips inside ear canal.   -Rest and stay well hydrated (by drinking water and other liquids).  -Stop Sudafed nasal spray. Using this longer than 3 days will cause rebound congestion. -Start Flonase/Fluticasone or Nasonex/Mometasone nasal spray, 2 sprays to each nostril once a day. -Take over-the-counter medicines (i.e. Sudafed/Pseudoephedrine, Tylenol ) to help relieve your symptoms. -Send MyChart message to provider or schedule return visit as needed for new/worsening symptoms (i.e. fever, increased sinus pain/pressure, increased eye redness/swelling) or if symptoms do not improve as discussed with prescribed/recommended treatment over the next 3-5 days.

## 2024-12-12 NOTE — Progress Notes (Signed)
 [facility] [address] [place], [place] [address]   Office Visit Note  Patient Name: Paige Robertson Date of Birth [id number]  Medical Record number [id number]  Date of Service: 12/12/2024  Chief Complaint  Patient presents with   Acute Visit    Patient reports having cold symptoms for >10 days. Her L eye started draining about 3 days ago and worsened yesterday. It became red and itchy and now her R eye is also itchy.     HPI Discussed the use of AI scribe software for clinical note transcription with the patient, who gave verbal consent to proceed.  History of Present Illness Paige Robertson is a 49 year old female who presents with head congestion and eye symptoms.  Upper respiratory symptoms - URI symptoms present for about 2 weeks, initially affecting chest and throat, then progressing to head, now mainly in nose/head - Nasal discharge is thick but clear, was previously colored - Occasional cough and scratchy throat - No fever, chills, or significant headaches - Treated with over-the-counter Mucinex DM (12-hour formulation) and Sudafed nasal spray for approximately one week - Able to sleep with nighttime Mucinex product  Ocular symptoms - Onset of left eye watering and redness 2 days ago, progressing to crusting and pain inside and under the eye by yesterday; Feels slightly better today - Right eye now with mild symptoms, less severe than left - No use of eye drops; applying warm compresses - Wears monthly contact lenses, removed and discarded when sx began  Otologic symptoms - Crusty ears for several weeks with occasional discomfort upon waking - Uses undiluted hydrogen peroxide in ears in attempt to clear crust - No significant ear pain or drainage  Sinus symptoms - Treated for sinus infection in November, with concern for incomplete resolution - Denies history of frequent sinus infections     Current Medication:  Outpatient Encounter  Medications as of 12/12/2024  Medication Sig   acetaminophen  (TYLENOL ) 325 MG tablet Take 2 tablets (650 mg total) by mouth every 6 (six) hours as needed for mild pain, moderate pain, fever or headache.   [DISCONTINUED] amoxicillin -clavulanate (AUGMENTIN ) 875-125 MG tablet Take 1 tablet by mouth 2 (two) times daily.   No facility-administered encounter medications on file as of 12/12/2024.      Medical History: Past Medical History:  Diagnosis Date   Acid reflux    Hematuria    Hyperlipemia    IDA (iron  deficiency anemia)    Melena    Perimenopausal symptoms    Vaginal discharge      Vital Signs: BP 110/70   Pulse 94   Temp 99.2 F (37.3 C)   Ht 5' 1 (1.549 m)   Wt 128 lb (58.1 kg)   SpO2 98%   BMI 24.19 kg/m    Review of Systems  Constitutional:  Positive for fatigue (mild). Negative for chills and fever.  HENT:  Positive for congestion, ear discharge (crust) and sinus pressure. Negative for ear pain, rhinorrhea, sore throat and trouble swallowing.   Eyes:  Positive for pain, discharge, redness and itching. Negative for visual disturbance.  Respiratory:  Positive for cough (occasional). Negative for shortness of breath.   Musculoskeletal:  Negative for myalgias, neck pain and neck stiffness.  Neurological:  Negative for headaches.    Physical Exam Vitals reviewed.  Constitutional:      General: She is not in acute distress.    Appearance: She is not ill-appearing.     Comments: Tired  appearing  HENT:     Head: Normocephalic.     Right Ear: External ear normal.     Left Ear: External ear normal.     Ears:     Comments: Ear canals appear moist, mildly erythematous with small white debris. Few small cuts/fissures around external meatus on right auricle. TMs dull bilaterally.    Nose: Mucosal edema (L>R), congestion (L>R) and rhinorrhea (some cloudy mucus on left) present.     Right Turbinates: Not swollen.     Left Turbinates: Swollen.     Comments: Mildly  tender to maxillary sinuses and slightly tender to frontal sinuses.    Mouth/Throat:     Mouth: Mucous membranes are moist. No oral lesions.     Pharynx: No pharyngeal swelling or posterior oropharyngeal erythema.     Tonsils: No tonsillar exudate.  Eyes:     General:        Right eye: No discharge.        Left eye: No discharge.     Comments: Eyelids on left slightly swollen. Palpebral conjunctivae on left moderately erythematous, slightly erythematous on right. Sclera on left moderately injected, clear on right.  Cardiovascular:     Rate and Rhythm: Normal rate and regular rhythm.     Heart sounds: No murmur heard.    No friction rub. No gallop.  Pulmonary:     Effort: Pulmonary effort is normal.     Breath sounds: Normal breath sounds. No wheezing, rhonchi or rales.  Musculoskeletal:     Cervical back: Neck supple. No rigidity.  Lymphadenopathy:     Cervical: Cervical adenopathy (small anterior) present.  Neurological:     Mental Status: She is alert.     Assessment/Plan: Assessment and Plan Assessment & Plan Acute conjunctivitis Bilateral conjunctivitis, more severe in left eye. Higher risk for bacterial infection due to contact lens use. - Prescribed antibiotic eye drops, one drop in both eyes four times daily for at least five days or until symptoms resolved for 24 hours. - Advised against wearing contact lenses until symptoms improve. - Instructed to avoid touching eyes and use hand sanitizer before using shared equipment/items at home or work.  Acute otitis externa Ear canal irritation likely worsened by requent hydrogen peroxide use. Concerned for otitis externa. - Prescribed antibiotic ear drops, 10 drops in each ear once daily for a week. - Advised to stop using hydrogen peroxide in ears. - Instructed to use a cotton ball in the ear after applying drops. - Advised against over-cleaning ears and Q-tip use inside ear canals.  Acute upper respiratory infection with  sinus congestion Persistent nasal congestion and thick mucus. Most likely viral. Sudafed nasal spray may have caused rebound congestion. - Discontinued Sudafed nasal spray. - Switched to Flonase or Nasonex nasal spray. - Use saline spray before Flonase. - Consider over-the-counter Sudafed/Pseudoephedrine if needed. - Encouraged increased water intake and rest. - Monitor symptoms and contact provider to consider antibiotics if sinus pressure/pain worsens or does not improve over next 3-5 days.   General Counseling: veralyn lopp understanding of the findings of todays visit and plan of treatment. she has been encouraged to call the office with any questions or concerns that should arise related to todays visit.    Time spent:20 Minutes    Joen Arts PA-C Physician Assistant

## 2024-12-26 ENCOUNTER — Encounter

## 2024-12-29 ENCOUNTER — Telehealth: Payer: Self-pay | Admitting: Oncology

## 2024-12-30 ENCOUNTER — Telehealth: Payer: Self-pay | Admitting: *Deleted

## 2024-12-30 NOTE — Telephone Encounter (Signed)
 LVM for patient that her 2/2 visit needs to be rescheduled due to delay in office opening due to weather coming over weekend. Scheduler will call with new appointment. Also sent MYchart message

## 2025-01-02 ENCOUNTER — Inpatient Hospital Stay: Admitting: Oncology

## 2025-01-03 ENCOUNTER — Telehealth: Payer: Self-pay | Admitting: Oncology

## 2025-01-03 ENCOUNTER — Encounter: Payer: Self-pay | Admitting: *Deleted

## 2025-01-03 NOTE — Progress Notes (Signed)
 OK to see NP week of 2/9 if patient agrees or can wait to schedule in 2-3 weeks with Dr. Cloretta. Scheduling message sent

## 2025-01-03 NOTE — Telephone Encounter (Signed)
 Voicemail let with patient with request to call back to reschedule appointment cancelled due to inclement weather.   Per Obie Giles can see her week of 2/09 or she can reschedule in next 2-3 weeks with Dr. Cloretta.)

## 2025-01-10 ENCOUNTER — Encounter

## 2025-02-13 ENCOUNTER — Inpatient Hospital Stay: Admitting: Oncology
